# Patient Record
Sex: Female | Born: 1955 | Race: Black or African American | Hispanic: No | Marital: Single | Smoking: Current every day smoker
Health system: Southern US, Community
[De-identification: ages and names within clinical notes are randomized; demographics above are authoritative.]

## PROBLEM LIST (undated history)

## (undated) DIAGNOSIS — I10 Essential (primary) hypertension: Secondary | ICD-10-CM

## (undated) DIAGNOSIS — Z860100 Personal history of colon polyps, unspecified: Secondary | ICD-10-CM

## (undated) DIAGNOSIS — M779 Enthesopathy, unspecified: Secondary | ICD-10-CM

## (undated) DIAGNOSIS — E785 Hyperlipidemia, unspecified: Secondary | ICD-10-CM

## (undated) DIAGNOSIS — M1990 Unspecified osteoarthritis, unspecified site: Secondary | ICD-10-CM

## (undated) DIAGNOSIS — E119 Type 2 diabetes mellitus without complications: Secondary | ICD-10-CM

## (undated) HISTORY — DX: Enthesopathy, unspecified: M77.9

## (undated) HISTORY — DX: Type 2 diabetes mellitus without complications: E11.9

## (undated) HISTORY — PX: CATARACT EXTRACTION: SUR2

## (undated) HISTORY — DX: Personal history of colonic polyps: Z86.010

## (undated) HISTORY — DX: Unspecified osteoarthritis, unspecified site: M19.90

## (undated) HISTORY — DX: Personal history of colon polyps, unspecified: Z86.0100

## (undated) HISTORY — DX: Essential (primary) hypertension: I10

## (undated) HISTORY — DX: Hyperlipidemia, unspecified: E78.5

---

## 1998-01-26 ENCOUNTER — Inpatient Hospital Stay (HOSPITAL_COMMUNITY): Admission: AD | Admit: 1998-01-26 | Discharge: 1998-01-29 | Payer: Self-pay | Admitting: Family Medicine

## 1999-05-15 ENCOUNTER — Encounter: Payer: Self-pay | Admitting: Obstetrics and Gynecology

## 1999-05-15 ENCOUNTER — Encounter: Admission: RE | Admit: 1999-05-15 | Discharge: 1999-05-15 | Payer: Self-pay | Admitting: Obstetrics and Gynecology

## 2000-06-05 ENCOUNTER — Encounter: Admission: RE | Admit: 2000-06-05 | Discharge: 2000-06-05 | Payer: Self-pay | Admitting: Obstetrics and Gynecology

## 2000-06-05 ENCOUNTER — Encounter: Payer: Self-pay | Admitting: Obstetrics and Gynecology

## 2000-07-24 ENCOUNTER — Encounter: Admission: RE | Admit: 2000-07-24 | Discharge: 2000-07-24 | Payer: Self-pay | Admitting: Family Medicine

## 2000-07-24 ENCOUNTER — Encounter: Payer: Self-pay | Admitting: Family Medicine

## 2001-08-25 ENCOUNTER — Encounter: Admission: RE | Admit: 2001-08-25 | Discharge: 2001-08-25 | Payer: Self-pay | Admitting: Obstetrics and Gynecology

## 2001-08-25 ENCOUNTER — Encounter: Payer: Self-pay | Admitting: Obstetrics and Gynecology

## 2002-11-06 ENCOUNTER — Encounter: Admission: RE | Admit: 2002-11-06 | Discharge: 2002-11-06 | Payer: Self-pay | Admitting: Obstetrics and Gynecology

## 2002-11-06 ENCOUNTER — Encounter: Payer: Self-pay | Admitting: Obstetrics and Gynecology

## 2003-11-10 ENCOUNTER — Encounter: Admission: RE | Admit: 2003-11-10 | Discharge: 2003-11-10 | Payer: Self-pay | Admitting: Obstetrics and Gynecology

## 2004-11-13 ENCOUNTER — Encounter: Admission: RE | Admit: 2004-11-13 | Discharge: 2004-11-13 | Payer: Self-pay | Admitting: Obstetrics and Gynecology

## 2005-12-03 ENCOUNTER — Encounter: Admission: RE | Admit: 2005-12-03 | Discharge: 2005-12-03 | Payer: Self-pay | Admitting: Obstetrics and Gynecology

## 2006-02-08 ENCOUNTER — Encounter: Admission: RE | Admit: 2006-02-08 | Discharge: 2006-02-08 | Payer: Self-pay | Admitting: Family Medicine

## 2006-12-26 ENCOUNTER — Encounter: Admission: RE | Admit: 2006-12-26 | Discharge: 2006-12-26 | Payer: Self-pay | Admitting: Obstetrics and Gynecology

## 2007-12-29 ENCOUNTER — Encounter: Admission: RE | Admit: 2007-12-29 | Discharge: 2007-12-29 | Payer: Self-pay | Admitting: Family Medicine

## 2008-01-02 ENCOUNTER — Encounter: Admission: RE | Admit: 2008-01-02 | Discharge: 2008-01-02 | Payer: Self-pay | Admitting: Family Medicine

## 2008-04-30 LAB — HM COLONOSCOPY

## 2008-08-27 ENCOUNTER — Other Ambulatory Visit: Admission: RE | Admit: 2008-08-27 | Discharge: 2008-08-27 | Payer: Self-pay | Admitting: Family Medicine

## 2008-12-29 ENCOUNTER — Encounter: Admission: RE | Admit: 2008-12-29 | Discharge: 2008-12-29 | Payer: Self-pay | Admitting: Family Medicine

## 2009-01-19 ENCOUNTER — Encounter: Payer: Self-pay | Admitting: Physician Assistant

## 2009-05-23 ENCOUNTER — Emergency Department (HOSPITAL_COMMUNITY): Admission: EM | Admit: 2009-05-23 | Discharge: 2009-05-23 | Payer: Self-pay | Admitting: Emergency Medicine

## 2009-06-02 ENCOUNTER — Encounter: Payer: Self-pay | Admitting: Physician Assistant

## 2009-06-09 ENCOUNTER — Ambulatory Visit: Payer: Self-pay | Admitting: Physician Assistant

## 2009-06-09 DIAGNOSIS — K5900 Constipation, unspecified: Secondary | ICD-10-CM | POA: Insufficient documentation

## 2009-06-09 DIAGNOSIS — M1990 Unspecified osteoarthritis, unspecified site: Secondary | ICD-10-CM | POA: Insufficient documentation

## 2009-06-09 DIAGNOSIS — E785 Hyperlipidemia, unspecified: Secondary | ICD-10-CM

## 2009-06-09 DIAGNOSIS — I10 Essential (primary) hypertension: Secondary | ICD-10-CM | POA: Insufficient documentation

## 2009-06-09 DIAGNOSIS — K219 Gastro-esophageal reflux disease without esophagitis: Secondary | ICD-10-CM | POA: Insufficient documentation

## 2009-06-09 DIAGNOSIS — E119 Type 2 diabetes mellitus without complications: Secondary | ICD-10-CM

## 2009-06-09 LAB — CONVERTED CEMR LAB
ALT: 15 units/L (ref 0–35)
AST: 17 units/L (ref 0–37)
Albumin: 4.7 g/dL (ref 3.5–5.2)
Alkaline Phosphatase: 94 units/L (ref 39–117)
Amphetamine Screen, Ur: NEGATIVE
BUN: 11 mg/dL (ref 6–23)
Barbiturate Quant, Ur: NEGATIVE
Basophils Absolute: 0 10*3/uL (ref 0.0–0.1)
Basophils Relative: 0 % (ref 0–1)
Benzodiazepines.: NEGATIVE
Blood Glucose, Fingerstick: 236
CO2: 24 meq/L (ref 19–32)
Calcium: 9.9 mg/dL (ref 8.4–10.5)
Chloride: 106 meq/L (ref 96–112)
Cocaine Metabolites: NEGATIVE
Creatinine, Ser: 0.7 mg/dL (ref 0.40–1.20)
Creatinine,U: 94.2 mg/dL
Eosinophils Absolute: 0.2 10*3/uL (ref 0.0–0.7)
Eosinophils Relative: 2 % (ref 0–5)
Glucose, Bld: 161 mg/dL — ABNORMAL HIGH (ref 70–99)
HCT: 40 % (ref 36.0–46.0)
Hemoglobin: 13.2 g/dL (ref 12.0–15.0)
Hgb A1c MFr Bld: 8.1 % — ABNORMAL HIGH (ref 4.6–6.1)
Lymphocytes Relative: 44 % (ref 12–46)
Lymphs Abs: 3.9 10*3/uL (ref 0.7–4.0)
MCHC: 33 g/dL (ref 30.0–36.0)
MCV: 82.8 fL (ref 78.0–100.0)
Marijuana Metabolite: NEGATIVE
Methadone: NEGATIVE
Microalb, Ur: 0.5 mg/dL (ref 0.00–1.89)
Monocytes Absolute: 0.7 10*3/uL (ref 0.1–1.0)
Monocytes Relative: 8 % (ref 3–12)
Neutro Abs: 4 10*3/uL (ref 1.7–7.7)
Neutrophils Relative %: 45 % (ref 43–77)
Opiate Screen, Urine: NEGATIVE
Phencyclidine (PCP): NEGATIVE
Platelets: 257 10*3/uL (ref 150–400)
Potassium: 4.6 meq/L (ref 3.5–5.3)
Propoxyphene: NEGATIVE
RBC: 4.83 M/uL (ref 3.87–5.11)
RDW: 15.9 % — ABNORMAL HIGH (ref 11.5–15.5)
Sodium: 142 meq/L (ref 135–145)
TSH: 1.214 microintl units/mL (ref 0.350–4.500)
Total Bilirubin: 0.3 mg/dL (ref 0.3–1.2)
Total Protein: 7.3 g/dL (ref 6.0–8.3)
WBC: 8.8 10*3/uL (ref 4.0–10.5)

## 2009-06-10 ENCOUNTER — Encounter: Payer: Self-pay | Admitting: Physician Assistant

## 2009-06-14 ENCOUNTER — Encounter: Payer: Self-pay | Admitting: Physician Assistant

## 2009-07-04 ENCOUNTER — Encounter: Payer: Self-pay | Admitting: Physician Assistant

## 2009-07-07 ENCOUNTER — Ambulatory Visit: Payer: Self-pay | Admitting: Physician Assistant

## 2009-07-07 DIAGNOSIS — N951 Menopausal and female climacteric states: Secondary | ICD-10-CM

## 2009-07-11 ENCOUNTER — Ambulatory Visit: Payer: Self-pay | Admitting: Physician Assistant

## 2009-08-24 ENCOUNTER — Telehealth: Payer: Self-pay | Admitting: Physician Assistant

## 2009-08-29 ENCOUNTER — Encounter: Payer: Self-pay | Admitting: Physician Assistant

## 2009-09-05 ENCOUNTER — Encounter: Payer: Self-pay | Admitting: Physician Assistant

## 2009-09-06 ENCOUNTER — Telehealth: Payer: Self-pay | Admitting: Physician Assistant

## 2009-09-06 ENCOUNTER — Other Ambulatory Visit: Admission: RE | Admit: 2009-09-06 | Discharge: 2009-09-06 | Payer: Self-pay | Admitting: Internal Medicine

## 2009-09-06 ENCOUNTER — Ambulatory Visit: Payer: Self-pay | Admitting: Physician Assistant

## 2009-09-06 DIAGNOSIS — K089 Disorder of teeth and supporting structures, unspecified: Secondary | ICD-10-CM | POA: Insufficient documentation

## 2009-09-06 DIAGNOSIS — R5381 Other malaise: Secondary | ICD-10-CM

## 2009-09-06 DIAGNOSIS — R0602 Shortness of breath: Secondary | ICD-10-CM | POA: Insufficient documentation

## 2009-09-06 DIAGNOSIS — R82998 Other abnormal findings in urine: Secondary | ICD-10-CM | POA: Insufficient documentation

## 2009-09-06 DIAGNOSIS — R5383 Other fatigue: Secondary | ICD-10-CM

## 2009-09-06 DIAGNOSIS — A5901 Trichomonal vulvovaginitis: Secondary | ICD-10-CM

## 2009-09-06 LAB — CONVERTED CEMR LAB
Bilirubin Urine: NEGATIVE
Glucose, Urine, Semiquant: NEGATIVE
KOH Prep: NEGATIVE
OCCULT 1: NEGATIVE
Protein, U semiquant: NEGATIVE
Urobilinogen, UA: 0.2
pH: 5

## 2009-09-07 ENCOUNTER — Encounter: Payer: Self-pay | Admitting: Physician Assistant

## 2009-09-07 LAB — CONVERTED CEMR LAB
Casts: NONE SEEN /lpf
Chlamydia, DNA Probe: NEGATIVE
Crystals: NONE SEEN

## 2009-09-09 ENCOUNTER — Encounter: Payer: Self-pay | Admitting: Physician Assistant

## 2009-09-13 ENCOUNTER — Ambulatory Visit: Payer: Self-pay | Admitting: Physician Assistant

## 2009-09-14 ENCOUNTER — Encounter: Payer: Self-pay | Admitting: Physician Assistant

## 2009-09-15 DIAGNOSIS — E559 Vitamin D deficiency, unspecified: Secondary | ICD-10-CM | POA: Insufficient documentation

## 2009-09-15 LAB — CONVERTED CEMR LAB
Cholesterol: 124 mg/dL (ref 0–200)
Total CHOL/HDL Ratio: 2.8
Triglycerides: 54 mg/dL (ref <150)
VLDL: 11 mg/dL (ref 0–40)
Vit D, 25-Hydroxy: 22 ng/mL — ABNORMAL LOW (ref 30–89)

## 2009-09-19 ENCOUNTER — Encounter: Payer: Self-pay | Admitting: Physician Assistant

## 2009-09-21 ENCOUNTER — Telehealth: Payer: Self-pay | Admitting: Physician Assistant

## 2009-09-27 ENCOUNTER — Ambulatory Visit: Payer: Self-pay | Admitting: Physician Assistant

## 2009-09-27 LAB — CONVERTED CEMR LAB
Glucose, Urine, Semiquant: NEGATIVE
Nitrite: NEGATIVE
Protein, U semiquant: NEGATIVE
Specific Gravity, Urine: >=1.03
WBC Urine, dipstick: NEGATIVE

## 2009-10-24 ENCOUNTER — Ambulatory Visit: Payer: Self-pay | Admitting: Physician Assistant

## 2009-10-27 ENCOUNTER — Encounter (HOSPITAL_COMMUNITY): Admission: RE | Admit: 2009-10-27 | Discharge: 2010-01-04 | Payer: Self-pay | Admitting: Cardiology

## 2009-11-04 ENCOUNTER — Encounter: Payer: Self-pay | Admitting: Physician Assistant

## 2009-11-04 ENCOUNTER — Telehealth: Payer: Self-pay | Admitting: Physician Assistant

## 2009-11-14 ENCOUNTER — Encounter: Payer: Self-pay | Admitting: Physician Assistant

## 2009-11-18 ENCOUNTER — Encounter: Payer: Self-pay | Admitting: Physician Assistant

## 2009-12-30 ENCOUNTER — Ambulatory Visit: Payer: Self-pay | Admitting: Physician Assistant

## 2009-12-30 DIAGNOSIS — M545 Low back pain: Secondary | ICD-10-CM

## 2009-12-30 DIAGNOSIS — F17200 Nicotine dependence, unspecified, uncomplicated: Secondary | ICD-10-CM | POA: Insufficient documentation

## 2010-01-03 ENCOUNTER — Encounter: Payer: Self-pay | Admitting: Physician Assistant

## 2010-01-03 LAB — CONVERTED CEMR LAB
AST: 21 units/L (ref 0–37)
CO2: 21 meq/L (ref 19–32)
Calcium: 10.1 mg/dL (ref 8.4–10.5)
Hgb A1c MFr Bld: 7.3 % — ABNORMAL HIGH (ref <5.7)
Potassium: 4.4 meq/L (ref 3.5–5.3)
Total Bilirubin: 0.3 mg/dL (ref 0.3–1.2)

## 2010-01-10 ENCOUNTER — Ambulatory Visit (HOSPITAL_COMMUNITY): Admission: RE | Admit: 2010-01-10 | Discharge: 2010-01-10 | Payer: Self-pay | Admitting: Internal Medicine

## 2010-05-05 ENCOUNTER — Ambulatory Visit: Admit: 2010-05-05 | Payer: Self-pay | Admitting: Internal Medicine

## 2010-05-21 ENCOUNTER — Encounter: Payer: Self-pay | Admitting: Cardiology

## 2010-05-30 NOTE — Letter (Signed)
Summary: GYNECOLOGIC CYTOLOGY REPORT  GYNECOLOGIC CYTOLOGY REPORT   Imported By: Arta Bruce 09/12/2009 10:15:17  _____________________________________________________________________  External Attachment:    Type:   Image     Comment:   External Document

## 2010-05-30 NOTE — Assessment & Plan Note (Signed)
Summary: NEW/HTN/DIABETIC/GI//KT   Vital Signs:  Patient profile:   55 year old female Menstrual status:  postmenopausal Height:      62.5 inches Weight:      158 pounds BMI:     28.54 Temp:     98.4 degrees F oral Pulse rate:   74 / minute Pulse rhythm:   regular Resp:     18 per minute BP sitting:   112 / 70  (left arm) Cuff size:   regular  Vitals Entered By: Armenia Shannon (June 09, 2009 9:58 AM) CC: NP..    pt says she doesn't think the med glimepiride is working for her... pt says she has been constapated with stomach aches that will go to her back and she thinks the med has something to do with this... pt has been on the med for a year now..., Hypertension Management, Abdominal Pain Is Patient Diabetic? Yes Pain Assessment Patient in pain? no      CBG Result 236  Does patient need assistance? Functional Status Self care Ambulation Normal     Menstrual Status postmenopausal   CC:  NP..    pt says she doesn't think the med glimepiride is working for her... pt says she has been constapated with stomach aches that will go to her back and she thinks the med has something to do with this... pt has been on the med for a year now..., Hypertension Management, and Abdominal Pain.  History of Present Illness: New patient. Previously followed by Dr. Lucianne Muss for diabetes and Dr. Levora Dredge for PCP. Has not had f/u since Sept.  DM:  Sugar high this am but drank some OJ.  But, has been running 150s in am and 200s in evenings.  Has not missed any meds.  Last A1C 8 in Sept. 2010.  She started onglyza at that time.  Did not see her sugars improving.  Had to stop when her insurance was lost.    HTN:  Taking meds.    GERD:  Controlled on pepcid as needed.  Notes some constipation.  Dyspepsia History:      She has no alarm features of dyspepsia including no history of melena, hematochezia, dysphagia, persistent vomiting, or involuntary weight loss > 5%.  There is a prior history of  GERD.  An H-2 blocker medication is currently being taken.    Hypertension History:      She denies chest pain, dyspnea with exertion, and syncope.        Positive major cardiovascular risk factors include diabetes, hyperlipidemia, hypertension, and current tobacco user.  Negative major cardiovascular risk factors include female age less than 22 years old.      Habits & Providers  Alcohol-Tobacco-Diet     Alcohol drinks/day: <1     Alcohol type: beer     Tobacco Status: current     Cigarette Packs/Day: 0.5     Pack years: 25  Exercise-Depression-Behavior     STD Risk: never     Drug Use: no  Current Medications (verified): 1)  Simvastatin 40 Mg Tabs (Simvastatin) .... One Tab Daily 2)  Famotidine 20 Mg Tabs (Famotidine) .... Take Two Tab By Mouth Daily 3)  Lisinopril 20 Mg Tabs (Lisinopril) .... One Tab By Mouth Daily 4)  Metformin Hcl 500 Mg Tabs (Metformin Hcl) .... One Tab By Mouth Twice Daily 5)  Glimepiride 4 Mg Tabs (Glimepiride) .... Half Tab By Mouth Daily  Allergies (verified): No Known Drug Allergies  Past History:  Past  Medical History: Diabetes mellitus, type II (dx 2007) Hyperlipidemia Hypertension Osteoarthritis   a.  left shoulder bone spur   b.  5th toe on left foot "crooked"; saw podiatrist and rec. surgery; pt declined GERD Colonoscopy 08/2008   a.  4 polyps removed ("benign"); incomplete prep and was to have repeat (never done)   b.  [facility] in [place] (Dr. Elbert Ewings. Noe Gens) Last Pap 08/2008 (supposed to have repeat; "not enough cells"; no h/o abnl pap)  Past Surgical History: Denies surgical history  Social History: Occupation: Laid Off; Previously worked at [employer] for 20 years. Current Smoker Alcohol use-yes Drug use-no Single 2 kids (adult)Occupation:  employed Smoking Status:  current Drug Use:  no Packs/Day:  0.5 STD Risk:  never  Review of Systems CV:  Denies chest pain or discomfort, fainting, and shortness of  breath with exertion. GI:  Complains of constipation; denies bloody stools and dark tarry stools. GU:  Denies hematuria. Endo:  Complains of cold intolerance.  Physical Exam  General:  alert, well-developed, and well-nourished.   Head:  normocephalic and atraumatic.   Eyes:  no injection.   Neck:  supple, no thyromegaly, no carotid bruits, and no cervical lymphadenopathy.   Lungs:  normal breath sounds, no crackles, and no wheezes.   Heart:  normal rate, regular rhythm, and no murmur.   Abdomen:  soft, non-tender, normal bowel sounds, and no hepatomegaly.   Extremities:  no edema Neurologic:  alert & oriented X3 and cranial nerves II-XII intact.   Skin:  bilat feet without callus or ulcer nails thickened and c/w onychomycosis  Psych:  normally interactive.     Impression & Recommendations:  Problem # 1:  Preventive Health Care (ICD-V70.0)  mammo done in Aug 2010 ("normal") had flu shot had pnuemovax in 2008 update Td today eventually schedule CPP get records  Orders: T-CBC w/Diff [phone]) T-TSH [phone]) T-HIV Antibody  (Reflex) [id number]) T-Syphilis Test (RPR) [phone]) T-Drug Screen-Urine, (single) ([id number])  Problem # 2:  DIABETES MELLITUS, TYPE II (ICD-250.00)  increase metformin to 1000 mg two times a day check labs  Her updated medication list for this problem includes:    Lisinopril 20 Mg Tabs (Lisinopril) ..... One tab by mouth daily    Metformin Hcl 1000 Mg Tabs (Metformin hcl) .Marland Kitchen... Take 1 tablet by mouth two times a day for diabetes    Glimepiride 4 Mg Tabs (Glimepiride) ..... Half tab by mouth daily    Aspir-low 81 Mg Tbec (Aspirin) .Marland Kitchen... Take 1 tablet by mouth once a day  Orders: T-Comprehensive Metabolic Panel ([phone]) T- Hemoglobin A1C ([id number]) T-Urine Microalbumin w/creat. ratio ([phone])  Problem # 3:  HYPERTENSION (ICD-401.9)  controlled check labs  Her updated medication list for this problem  includes:    Lisinopril 20 Mg Tabs (Lisinopril) ..... One tab by mouth daily  Orders: T-Comprehensive Metabolic Panel ([id number])  Problem # 4:  HYPERLIPIDEMIA (ICD-272.4)  will need to get fasting lipids at some point  Her updated medication list for this problem includes:    Simvastatin 40 Mg Tabs (Simvastatin) ..... One tab daily  Orders: T-Comprehensive Metabolic Panel ([id number])  Problem # 5:  CONSTIPATION (ICD-564.00)  Orders: T-TSH ([id number])  Her updated medication list for this problem includes:    Miralax Powd (Polyethylene glycol 3350) .Marland Kitchen... Dissolve one capful in one glass of water and drink once daily as needed for constipation  Complete Medication List: 1)  Simvastatin 40 Mg Tabs (Simvastatin) .... One tab daily 2)  Famotidine 20  Mg Tabs (Famotidine) .... Take two tab by mouth daily 3)  Lisinopril 20 Mg Tabs (Lisinopril) .... One tab by mouth daily 4)  Metformin Hcl 1000 Mg Tabs (Metformin hcl) .... Take 1 tablet by mouth two times a day for diabetes 5)  Glimepiride 4 Mg Tabs (Glimepiride) .... Half tab by mouth daily 6)  Aspir-low 81 Mg Tbec (Aspirin) .... Take 1 tablet by mouth once a day 7)  Miralax Powd (Polyethylene glycol 3350) .... Dissolve one capful in one glass of water and drink once daily as needed for constipation  Hypertension Assessment/Plan:      The patient's hypertensive risk group is category C: Target organ damage and/or diabetes.  Today's blood pressure is 112/70.  Her blood pressure goal is < 130/80.  Patient Instructions: 1)  Schedule [facility] at [facility]. clinic. 2)  Sign forms to get records from Dr. Lucianne Muss and Dr. Manus Gunning. 3)  Please schedule a follow-up appointment in 1 month with Kentarius Partington for diabetes. 4)  Increase metformin to 1000 mg two times a day.  You can take 2 tablets of the 500 mg to = 1000mg . 5)  Tetanus shot today. Prescriptions: METFORMIN HCL 1000 MG TABS (METFORMIN HCL) Take 1 tablet by mouth two times a day for  diabetes  #60 x 5   Entered and Authorized by:   Tereso Newcomer PA-C   Signed by:   Tereso Newcomer PA-C on 06/09/2009   Method used:   Print then Give to Patient   RxID:   [id number] MIRALAX  POWD (POLYETHYLENE GLYCOL 3350) dissolve one capful in one glass of water and drink once daily as needed for constipation  #1 bottle x 3   Entered and Authorized by:   Tereso Newcomer PA-C   Signed by:   Tereso Newcomer PA-C on 06/09/2009   Method used:   Print then Give to Patient   RxID:   [id number]

## 2010-05-30 NOTE — Assessment & Plan Note (Signed)
Summary: 3 MONTH FU ON DM AND BP/BACK & SHOULDER//KT   Vital Signs:  Patient profile:   55 year old female Menstrual status:  postmenopausal Height:      62.5 inches Weight:      159 pounds BMI:     28.72 Temp:     99.8 degrees F oral Pulse rate:   76 / minute Pulse rhythm:   regular Resp:     18 per minute BP sitting:   128 / 80  (left arm) Cuff size:   regular  Vitals Entered By: Arm[place] Shannon (December 30, 2009 9:55 AM) CC: three month f/u....., Hypertension Management Is Patient Diabetic? Yes Pain Assessment Patient in pain? no       Does patient need assistance? Functional Status Self care Ambulation Normal   Primary Care Provider:  Tereso Newcomer PA-C  CC:  three month f/u..... and Hypertension Management.  History of Present Illness: 55 year old female presents for followup of diabetes and hypertension.  Overall she is doing well.  She has not had her Amaryl in about 2 weeks.  The MAP program is delayed.  She did see a cardiologist.  Her Myoview was negative.  Her EF was mildly reduced.  It does not sound like they did an echocardiogram.  Her symptoms of dyspnea seem to be more related to COPD than anything else.  She continues to smoke.  She has a cough.  She minimally wheezes.  The albuterol inhaler has helped significantly.  She describes New York Heart Association class II symptoms.  She denies orthopnea or PND.  She denies chest pain.  She denies any lower extremity edema.  She was supposed to get a sleep study to rule out sleep apnea.  She had to reschedule this.  She has some low back pain.  This gets worse when she is on her feet longer.  She denies radicular symptoms.  She denies bowel or bladder dysfunction.  She would like help of quitting smoking.  She denies any significant history of depression or suicidal ideations.  Hypertension History:      Positive major cardiovascular risk factors include female age 69 years old or older, diabetes, hyperlipidemia,  hypertension, and current tobacco user.    Current Medications (verified): 1)  Simvastatin 40 Mg Tabs (Simvastatin) .... Take 1 Tab By Mouth At Bedtime For Cholesterol 2)  Famotidine 20 Mg Tabs (Famotidine) .... Take Two Tab By Mouth Daily 3)  Lisinopril 20 Mg Tabs (Lisinopril) .... One Tab By Mouth Daily 4)  Metformin Hcl 1000 Mg Tabs (Metformin Hcl) .... Take 1 Tablet By Mouth Two Times A Day For Diabetes 5)  Glimepiride 4 Mg Tabs (Glimepiride) .... Half Tab By Mouth Daily 6)  Aspir-Low 81 Mg Tbec (Aspirin) .... Take 1 Tablet By Mouth Once A Day 7)  Miralax  Powd (Polyethylene Glycol 3350) .... Dissolve One Capful in One Glass of Water and Drink Once Daily As Needed For Constipation 8)  Freestyle Lite Test  Strp (Glucose Blood) .... Check Sugar At Least Once Daily 9)  Proventil Hfa 108 (90 Base) Mcg/act Aers (Albuterol Sulfate) .Marland Kitchen.. 1-2 Puffs Every 4-6 Hours As Needed 10)  Vitamin D 1000 Unit Tabs (Cholecalciferol) .... Take 1 Tablet By Mouth Once A Day  Allergies (verified): No Known Drug Allergies  Social History: Reviewed history from 06/09/2009 and no changes required. Occupation: [employer]; Previously worked at [employer] for 20 years. Current Smoker Alcohol use-yes Drug use-no Single 2 kids (adult)  Physical Exam  General:  alert, well-developed, and well-nourished.   Head:  normocephalic and atraumatic.   Eyes:  pupils equal, pupils round, pupils reactive to light, and no optic disk abnormalities.   Ears:  R ear normal and L ear normal.   Neck:  supple.   Lungs:  normal breath sounds, no crackles, and no wheezes.   Heart:  normal rate and regular rhythm.   Abdomen:  soft.   Neurologic:  alert & oriented X3 and cranial nerves II-XII intact.   Psych:  normally interactive.    Diabetes Management Exam:    Foot Exam (with socks and/or shoes not present):       Inspection:          Left foot: normal          Right foot: normal       Nails:          Left foot:  thickened          Right foot: thickened   Impression & Recommendations:  Problem # 1:  DIABETES MELLITUS, TYPE II (ICD-250.00) out of glimepiride hard to get at [facility] and waiting on MAP d/c glimepiride replace with glipizide  The following medications were removed from the medication list:    Glimepiride 4 Mg Tabs (Glimepiride) ..... Half tab by mouth daily Her updated medication list for this problem includes:    Lisinopril 20 Mg Tabs (Lisinopril) ..... One tab by mouth daily    Metformin Hcl 1000 Mg Tabs (Metformin hcl) .Marland Kitchen... Take 1 tablet by mouth two times a day for diabetes    Aspir-low 81 Mg Tbec (Aspirin) .Marland Kitchen... Take 1 tablet by mouth once a day    Glipizide 5 Mg Tabs (Glipizide) .Marland Kitchen... Take 1 tablet by mouth once a day for diabetes.  take with a meal.  (pharmacy:  this replaces glimepiride - d/c glimepiride)  Orders: Capillary Blood Glucose/CBG (56213) T- Hemoglobin A1C ([id number]) T-Comprehensive Metabolic Panel ([id number])  Problem # 2:  HYPERTENSION (ICD-401.9) Assessment: Improved controlled  Her updated medication list for this problem includes:    Lisinopril 20 Mg Tabs (Lisinopril) ..... One tab by mouth daily  Orders: T-Comprehensive Metabolic Panel ([id number])  Problem # 3:  HYPERLIPIDEMIA (ICD-272.4) check LFTs  Her updated medication list for this problem includes:    Simvastatin 40 Mg Tabs (Simvastatin) .Marland Kitchen... Take 1 tab by mouth at bedtime for cholesterol  Orders: T-Comprehensive Metabolic Panel ([id number])  Problem # 4:  VITAMIN D DEFICIENCY (ICD-268.9) on replacement  Orders: T-Vitamin D (25-Hydroxy) (95638-75643)  Problem # 5:  LUMBAGO (ICD-724.2) prob mechanical conservative measures:  heat, nsaids, f/u as needed   Her updated medication list for this problem includes:    Aspir-low 81 Mg Tbec (Aspirin) .Marland Kitchen... Take 1 tablet by mouth once a day  Problem # 6:  DYSPNEA (ICD-786.05)  myoview was neg EF was a little low . . . do not  think echo was done no symptoms of CHF patient likely has COPD feels pretty good wih albuterol as needed discussed spiriva . . .does not want to get it breathing stable. . . .hold off on echo or PFTs for now if breathing changes, will get with h/o smoking, will get cxr  Orders: CXR- 2view (CXR)  Problem # 7:  TOBACCO ABUSE (ICD-305.1)  wants something to quit smoking she has no h/o depression no suicidal ideations warned of SE's to chantix  Orders: CXR- 2view (CXR)  Her updated medication list for this problem includes:  Chantix Starting Month Pak 0.5 Mg X 11 & 1 Mg X 42 Tabs (Varenicline tartrate) .Marland Kitchen... As directed    Chantix Continuing Month Pak 1 Mg Tabs (Varenicline tartrate) .Marland Kitchen... As directed  Complete Medication List: 1)  Simvastatin 40 Mg Tabs (Simvastatin) .... Take 1 tab by mouth at bedtime for cholesterol 2)  Famotidine 20 Mg Tabs (Famotidine) .... Take two tab by mouth daily 3)  Lisinopril 20 Mg Tabs (Lisinopril) .... One tab by mouth daily 4)  Metformin Hcl 1000 Mg Tabs (Metformin hcl) .... Take 1 tablet by mouth two times a day for diabetes 5)  Aspir-low 81 Mg Tbec (Aspirin) .... Take 1 tablet by mouth once a day 6)  Miralax Powd (Polyethylene glycol 3350) .... Dissolve one capful in one glass of water and drink once daily as needed for constipation 7)  Freestyle Lite Test Strp (Glucose blood) .... Check sugar at least once daily 8)  Proventil Hfa 108 (90 Base) Mcg/act Aers (Albuterol sulfate) .Marland Kitchen.. 1-2 puffs every 4-6 hours as needed 9)  Vitamin D 1000 Unit Tabs (Cholecalciferol) .... Take 1 tablet by mouth once a day 10)  Glipizide 5 Mg Tabs (Glipizide) .... Take 1 tablet by mouth once a day for diabetes.  take with a meal.  (pharmacy:  this replaces glimepiride - d/c glimepiride) 11)  Chantix Starting Month Pak 0.5 Mg X 11 & 1 Mg X 42 Tabs (Varenicline tartrate) .... As directed 12)  Chantix Continuing Month Pak 1 Mg Tabs (Varenicline tartrate) .... As  directed  Hypertension Assessment/Plan:      The patient's hypertensive risk group is category C: Target organ damage and/or diabetes.  Her calculated 10 year risk of coronary heart disease is 17 %.  Today's blood pressure is 128/80.  Her blood pressure goal is < 130/80.   Patient Instructions: 1)  Schedule your sleep test. 2)  Take Ibuprofen 600 mg with food every 6-8 hours as needed for back pain. 3)  Use heat on your back two times a day to three times a day as needed. 4)  If your pain gets worse or does not improve, schedule an appt with Oakleigh Hesketh for back pain. 5)  Stop the glimepiride. 6)  I have given your Glipizide to take instead.  Take once daily with a meal. 7)  Tobacco is very bad for your health and your loved ones ! You should stop smoking !  8)  Stop smoking tips: Choose a quit date. Cut down before the quit date. Decide what you will do as a substitute when you feel the urge to smoke(gum, toothpick, exercise).  9)  Start the Chantix about one month before your quit date. 10)  Start on a weekend and don't drive.  If you get dizzy with it, stop it and let me know. 11)  Stop the medicine if you develop vivid dreams that are bothersome or thoughts of suicide. 12)  You can continue the Chantix for 2-3 months after you quit, then stop it. 13)  Please schedule a follow-up appointment in 3 months with Johnny Gorter for Diabetes and BP.  Prescriptions: SIMVASTATIN 40 MG TABS (SIMVASTATIN) Take 1 tab by mouth at bedtime for cholesterol  #30 x 5   Entered and Authorized by:   Tereso Newcomer PA-C   Signed by:   Tereso Newcomer PA-C on 12/30/2009   Method used:   Print then Give to Patient   RxID:   [id number] CHANTIX CONTINUING MONTH PAK 1 MG TABS (VARENICLINE TARTRATE) as  directed  #1 x 2   Entered and Authorized by:   Tereso Newcomer PA-C   Signed by:   Tereso Newcomer PA-C on 12/30/2009   Method used:   Print then Give to Patient   RxID:   [id number] CHANTIX STARTING MONTH PAK 0.5 MG X 11  & 1 MG X 42 TABS (VARENICLINE TARTRATE) as directed  #1 x 0   Entered and Authorized by:   Tereso Newcomer PA-C   Signed by:   Tereso Newcomer PA-C on 12/30/2009   Method used:   Print then Give to Patient   RxID:   [id number] GLIPIZIDE 5 MG TABS (GLIPIZIDE) Take 1 tablet by mouth once a day for diabetes.  Take with a meal.  (Pharmacy:  This replaces Glimepiride - d/c Glimepiride)  #30 x 5   Entered and Authorized by:   Tereso Newcomer PA-C   Signed by:   Tereso Newcomer PA-C on 12/30/2009   Method used:   Print then Give to Patient   RxID:   [id number]

## 2010-05-30 NOTE — Letter (Signed)
Summary: RETASURE  RETASURE   Imported By: Arta Bruce 09/20/2009 10:23:41  _____________________________________________________________________  External Attachment:    Type:   Image     Comment:   External Document

## 2010-05-30 NOTE — Letter (Signed)
Summary: [facility]  REQUESTING RECORDS FROM[facility]   Imported By: Arta Bruce 08/15/2009 16:27:06  _____________________________________________________________________  External Attachment:    Type:   Image     Comment:   External Document

## 2010-05-30 NOTE — Progress Notes (Signed)
Summary: Cardiology Referral  Phone Note Outgoing Call   Summary of Call: Refer to cardiology for dyspnea in patient with multiple cardiac risk factors. Letter in system. Initial call taken by: Tereso Newcomer PA-C,  Sep 06, 2009 5:25 PM

## 2010-05-30 NOTE — Assessment & Plan Note (Signed)
Summary: FOLLOW UP IN 1 MONTH WITH SCOTT FOR DM///GK   Vital Signs:  Patient profile:   55 year old female Menstrual status:  postmenopausal Weight:      157.7 pounds Temp:     98.4 degrees F oral Pulse rate:   77 / minute Pulse rhythm:   regular Resp:     17 per minute BP sitting:   117 / 73  (left arm) Cuff size:   regular  Vitals Entered By: Geanie Cooley (July 07, 2009 2:44 PM) CC: Pt here for f/u on DM. Pt states her DM has been good, it just depends on what she eats., Hypertension Management Is Patient Diabetic? Yes Did you bring your meter with you today? No Pain Assessment Patient in pain? no      CBG Result 73  Does patient need assistance? Functional Status Self care Ambulation Normal   CC:  Pt here for f/u on DM. Pt states her DM has been good, it just depends on what she eats., and Hypertension Management.  History of Present Illness: Here for f/u. Sugars on meter reviewed.  Range from 80s to 220. No polyuria, polydipsia or polyphagia. Postmenopausal.  Has a lot of hot flashes. Has blurry vision.  Feels her visual acuity is decreasing.  No loss of vision.   Hypertension History:      She denies chest pain and dyspnea with exertion.  She notes no problems with any antihypertensive medication side effects.        Positive major cardiovascular risk factors include diabetes, hyperlipidemia, hypertension, and current tobacco user.  Negative major cardiovascular risk factors include female age less than 91 years old.     Problems Prior to Update: 1)  Menopause-related Vasomotor Symptoms, Hot Flashes  (ICD-627.2) 2)  Preventive Health Care  (ICD-V70.0) 3)  Constipation  (ICD-564.00) 4)  Gerd  (ICD-530.81) 5)  Osteoarthritis  (ICD-715.90) 6)  Hypertension  (ICD-401.9) 7)  Hyperlipidemia  (ICD-272.4) 8)  Diabetes Mellitus, Type II  (ICD-250.00)  Allergies (verified): No Known Drug Allergies  Past History:  Past Medical History: Last  updated: 07/04/2009 Diabetes mellitus, type II (dx 2007) Hyperlipidemia Hypertension Osteoarthritis   a.  left shoulder bone spur   b.  5th toe on left foot "crooked"; saw podiatrist and rec. surgery; pt declined Left shoulder impingement - eval by Dr. Gean Birchwood 11/2008 GERD Colonoscopy 08/2008   a.  4 polyps removed ("benign"); incomplete prep and was to have repeat (never done)   b.  [facility] in [place] (Dr. Elbert Ewings. Noe Gens)   c.  repeat planned in 2015 Last Pap 08/2008 (supposed to have repeat; "not enough cells"; no h/o abnl pap)   a.  ASCUS  Mammo 12/2008:  normal  Past Surgical History: Last updated: 06/09/2009 Denies surgical history  Family History: CAD - sis died at 49 with MI DM - brother  Physical Exam  General:  alert, well-developed, and well-nourished.   Head:  normocephalic and atraumatic.   Neck:  supple.   Lungs:  normal breath sounds.   Heart:  normal rate and regular rhythm.   Extremities:  no edema Neurologic:  alert & oriented X3 and cranial nerves II-XII intact.   Psych:  normally interactive.     Impression & Recommendations:  Problem # 1:  DIABETES MELLITUS, TYPE II (ICD-250.00) sugars ok most below 200 encouraged her to increase activity and watch diet recheck A1C at f/u schedule retasure advised her to go to [facility] for eye  exam  Her updated medication list for this problem includes:    Lisinopril 20 Mg Tabs (Lisinopril) ..... One tab by mouth daily    Metformin Hcl 1000 Mg Tabs (Metformin hcl) .Marland Kitchen... Take 1 tablet by mouth two times a day for diabetes    Glimepiride 4 Mg Tabs (Glimepiride) ..... Half tab by mouth daily    Aspir-low 81 Mg Tbec (Aspirin) .Marland Kitchen... Take 1 tablet by mouth once a day  Orders: Capillary Blood Glucose/CBG (19147)  Problem # 2:  HYPERTENSION (ICD-401.9) controlled  Her updated medication list for this problem includes:    Lisinopril 20 Mg Tabs (Lisinopril) ..... One tab by mouth daily  Problem # 3:   PREVENTIVE HEALTH CARE (ICD-V70.0) past due for pap had ASCUS in 08/2008 schedule CPP  Problem # 4:  MENOPAUSE-RELATED VASOMOTOR SYMPTOMS, HOT FLASHES (ICD-627.2) advised her to try OTC remedies she has been looking into this she can try black cohosh or phytoestrogens consider other therapies if unsuccessful  Complete Medication List: 1)  Simvastatin 40 Mg Tabs (Simvastatin) .... One tab daily 2)  Famotidine 20 Mg Tabs (Famotidine) .... Take two tab by mouth daily 3)  Lisinopril 20 Mg Tabs (Lisinopril) .... One tab by mouth daily 4)  Metformin Hcl 1000 Mg Tabs (Metformin hcl) .... Take 1 tablet by mouth two times a day for diabetes 5)  Glimepiride 4 Mg Tabs (Glimepiride) .... Half tab by mouth daily 6)  Aspir-low 81 Mg Tbec (Aspirin) .... Take 1 tablet by mouth once a day 7)  Miralax Powd (Polyethylene glycol 3350) .... Dissolve one capful in one glass of water and drink once daily as needed for constipation 8)  Freestyle Lite Test Strp (Glucose blood) .... Check sugar at least once daily  Hypertension Assessment/Plan:      The patient's hypertensive risk group is category C: Target organ damage and/or diabetes.  Today's blood pressure is 117/73.  Her blood pressure goal is < 130/80.  Patient Instructions: 1)  Schedule retasure at [facility]. 2)  Please schedule a follow-up appointment in 2 months with Scott for CPP. 3)  Come fasting for blood work.  DO not eat or drink anything after midnight the night before (except water). 4)  It is important that you exercise reguarly at least 20 minutes 5 times a week. If you develop chest pain, have severe difficulty breathing, or feel very tired, stop exercising immediately and seek medical attention.  5)  Look for a special at [facility] or [employer] to get your eyes examined. Prescriptions: FREESTYLE LITE TEST  STRP (GLUCOSE BLOOD) check sugar at least once daily  #1 mo supply x 11   Entered and Authorized by:   Tereso Newcomer PA-C   Signed  by:   Tereso Newcomer PA-C on 07/07/2009   Method used:   Print then Give to Patient   RxID:   [id number]    Vital Signs:  Patient profile:   55 year old female Menstrual status:  postmenopausal Weight:      157.7 pounds Temp:     98.4 degrees F oral Pulse rate:   77 / minute Pulse rhythm:   regular Resp:     17 per minute BP sitting:   117 / 73  (left arm) Cuff size:   regular  Vitals Entered By: Geanie Cooley (July 07, 2009 2:44 PM)

## 2010-05-30 NOTE — Miscellaneous (Signed)
Summary: Pap Normal  Clinical Lists Changes  Observations: Added new observation of PAP SMEAR:  Specimen Adequacy: Satisfactory for evaluation.   Interpretation/Result:Negative for intraepithelial Lesion or Malignancy.   Interpretation/Result:Trichomonas Vaginalis present.     (09/06/2009 17:08)      Pap Smear  Procedure date:  09/06/2009  Findings:       Specimen Adequacy: Satisfactory for evaluation.   Interpretation/Result:Negative for intraepithelial Lesion or Malignancy.   Interpretation/Result:Trichomonas Vaginalis present.

## 2010-05-30 NOTE — Letter (Signed)
Summary: REQUESTING RECORDS FROM FROM DR.EHINGER  REQUESTING RECORDS FROM FROM DR.EHINGER   Imported By: Arta Bruce 08/15/2009 16:32:49  _____________________________________________________________________  External Attachment:    Type:   Image     Comment:   External Document

## 2010-05-30 NOTE — Miscellaneous (Signed)
Summary: Stress Test Done  Clinical Lists Changes  Observations: Added new observation of PAST MED HX: Diabetes mellitus, type II (dx 2007) Hyperlipidemia Hypertension Osteoarthritis   a.  left shoulder bone spur   b.  5th toe on left foot "crooked"; saw podiatrist and rec. surgery; pt declined Left shoulder impingement - eval by Dr. Gean Birchwood 11/2008 GERD Colonoscopy 08/2008   a.  4 polyps removed ("benign"); incomplete prep and was to have repeat (never done)   b.  [facility] in [place] (Dr. Elbert Ewings. Noe Gens)   c.  repeat planned in 2015 Last Pap 08/2008 (supposed to have repeat; "not enough cells"; no h/o abnl pap)   a.  ASCUS  Mammo 12/2008:  normal Exercise myoview 10/27/2009: no ischemia; EF 48%; apical, lateral and inf. [place] (Dr. Anne Fu; [place])  (11/14/2009 12:04) Added new observation of NUCETTCOMM: Ordered by Dr. Anne Fu. Performed at [facility]. (10/27/2009 12:07) Added new observation of NUCST CONC: 1.  There is no evidence for exercise induced myocardial ischemia. 2.  LV EF = 48% 3.  Apical, Lateral and Inferior wall [place] (10/27/2009 12:07)      Nuclear ETT  Procedure date:  10/27/2009  Findings:      1.  There is no evidence for exercise induced myocardial ischemia. 2.  LV EF = 48% 3.  Apical, Lateral and Inferior wall [place]  Comments:      Ordered by Dr. Anne Fu. Performed at [facility].   Past History:  Past Medical History: Diabetes mellitus, type II (dx 2007) Hyperlipidemia Hypertension Osteoarthritis   a.  left shoulder bone spur   b.  5th toe on left foot "crooked"; saw podiatrist and rec. surgery; pt declined Left shoulder impingement - eval by Dr. Gean Birchwood 11/2008 GERD Colonoscopy 08/2008   a.  4 polyps removed ("benign"); incomplete prep and was to have repeat (never done)   b.  [facility] in [place] (Dr. Elbert Ewings. Noe Gens)   c.  repeat planned in 2015 Last Pap 08/2008 (supposed to have repeat; "not enough cells"; no h/o abnl  pap)   a.  ASCUS  Mammo 12/2008:  normal Exercise myoview 10/27/2009: no ischemia; EF 48%; apical, lateral and inf. [place] (Dr. Anne Fu; [place])

## 2010-05-30 NOTE — Letter (Signed)
Summary: RECEIVED RECORDS FROM DR.EHINGER  RECEIVED RECORDS FROM DR.EHINGER   Imported By: Arta Bruce 08/29/2009 16:35:44  _____________________________________________________________________  External Attachment:    Type:   Image     Comment:   External Document

## 2010-05-30 NOTE — Letter (Signed)
Summary: P4[facility]  P4HM   Imported By: Arta Bruce 08/04/2009 14:33:06  _____________________________________________________________________  External Attachment:    Type:   Image     Comment:   External Document

## 2010-05-30 NOTE — Progress Notes (Signed)
Summary: THE [facility] FAXED HER CHOL MED REFILL  Phone Note Call from Patient Call back at Home Phone [phone]   Reason for Call: Refill Medication Summary of Call: Haley Mendez PT. MS Teng CALLED AND SAYS THAT THE [facility] FAXED OVER HER REFILL REQUEST FOR HER CHOL. MED YESTERDAY. Initial call taken by: Leodis Rains,  August 24, 2009 2:44 PM  Follow-up for Phone Call        forward to provider Follow-up by: [place] Shannon,  August 24, 2009 2:55 PM  Additional Follow-up for Phone Call Additional follow up Details #1::        the pt called back again because she needs her chol medication.[facility]. Manon Hilding  August 25, 2009 10:57 AM    Additional Follow-up for Phone Call Additional follow up Details #2::    Left message on answering machine for pt to call back...Marland KitchenMarland KitchenArmenia Shannon  August 25, 2009 2:21 PM  pt is aware... Armenia Shannon  August 25, 2009 4:06 PM   New/Updated Medications: SIMVASTATIN 40 MG TABS (SIMVASTATIN) Take 1 tab by mouth at bedtime for cholesterol Prescriptions: SIMVASTATIN 40 MG TABS (SIMVASTATIN) Take 1 tab by mouth at bedtime for cholesterol  #30 x 5   Entered and Authorized by:   Tereso Newcomer PA-C   Signed by:   Tereso Newcomer PA-C on 08/25/2009   Method used:   Faxed to ...       [facility] Department (retail)       [address] [place], [place]  [address]       Ph: [phone]       Fax: [phone]   RxID:   [id number]

## 2010-05-30 NOTE — Assessment & Plan Note (Signed)
Summary: CPP EXAM//GK   Vital Signs:  Patient profile:   55 year old female Menstrual status:  postmenopausal Height:      62.5 inches Weight:      157 pounds BMI:     28.36 Temp:     98.4 degrees F oral Pulse rate:   78 / minute Pulse rhythm:   regular Resp:     18 per minute BP sitting:   140 / 84  (left arm) Cuff size:   regular  Vitals Entered By: Armenia Shannon (Sep 06, 2009 9:07 AM)  Primary Care Provider:  Tereso Newcomer PA-C   History of Present Illness: Here for CPP.  Has a h/o ASCUS with previous PCP. Overdue for pap. No abnormal bleeding. Does have recent vaginal discharge.  Notes for about a year.  Notes foul odor.  Looks yellow or brown. Sexually active with one partner. Does have a h/o trichomonas in the past.   No pelvic pain or fevers. Mammo due in 11/2009. No FHx of breast cancer, ovarian cancer or colon cancer. Colo due in 2015. Td shot not done last time. PHQ9=7 today. . . mainly from fatigue.  Also, c/o toothache for about 2 weeks.  No fever.  No facial swelling.  Does not think tooth has fallen out. No dental follow up in many years.  Notes a lot of fatigue.  Does report h/o snoring.  Never tested for sleep apnea.  Wakes up feeling sleepy.  Notes daytime hypersomnolence.  Notes these symptoms for about 6-7 years.  Hypertension History:      Positive major cardiovascular risk factors include diabetes, hyperlipidemia, hypertension, and current tobacco user.  Negative major cardiovascular risk factors include female age less than 75 years old.    Habits & Providers  Alcohol-Tobacco-Diet     Alcohol drinks/day: <1     Alcohol type: beer     Tobacco Status: current  Exercise-Depression-Behavior     Have you felt down or hopeless? no     Have you felt little pleasure in things? no     STD Risk: past     Drug Use: never     Seat Belt Use: always  Problems Prior to Update: 1)  Trichomonal Vaginitis  (ICD-131.01) 2)  Dyspnea  (ICD-786.05) 3)   Dental Pain  (ICD-525.9) 4)  Fatigue  (ICD-780.79) 5)  Urinalysis, Abnormal  (ICD-791.9) 6)  Menopause-related Vasomotor Symptoms, Hot Flashes  (ICD-627.2) 7)  Preventive Health Care  (ICD-V70.0) 8)  Constipation  (ICD-564.00) 9)  Gerd  (ICD-530.81) 10)  Osteoarthritis  (ICD-715.90) 11)  Hypertension  (ICD-401.9) 12)  Hyperlipidemia  (ICD-272.4) 13)  Diabetes Mellitus, Type II  (ICD-250.00)  Allergies: No Known Drug Allergies  Past History:  Past Medical History: Last updated: 07/04/2009 Diabetes mellitus, type II (dx 2007) Hyperlipidemia Hypertension Osteoarthritis   a.  left shoulder bone spur   b.  5th toe on left foot "crooked"; saw podiatrist and rec. surgery; pt declined Left shoulder impingement - eval by Dr. Gean Birchwood 11/2008 GERD Colonoscopy 08/2008   a.  4 polyps removed ("benign"); incomplete prep and was to have repeat (never done)   b.  [facility] in [place] (Dr. Elbert Ewings. Noe Gens)   c.  repeat planned in 2015 Last Pap 08/2008 (supposed to have repeat; "not enough cells"; no h/o abnl pap)   a.  ASCUS  Mammo 12/2008:  normal  Past Surgical History: Last updated: 06/09/2009 Denies surgical history  Family History: Reviewed history from 07/07/2009 and no  changes required. CAD - sis died at 80 with MI; mother had MI 60 DM - brother, father  Social History: Drug Use:  never STD Risk:  past Seat Belt Use:  always  Review of Systems      See HPI General:  Denies chills and fever. CV:  Complains of shortness of breath with exertion; denies chest pain or discomfort. Resp:  Complains of cough; denies coughing up blood. GI:  Denies bloody stools and dark tarry stools. GU:  Denies dysuria, hematuria, urinary frequency, and urinary hesitancy. Psych:  Denies suicidal thoughts/plans. Endo:  Denies cold intolerance and heat intolerance.  Physical Exam  General:  alert, well-developed, and well-nourished.   Head:  normocephalic and atraumatic.   Eyes:  pupils  equal, pupils round, pupils reactive to light, and no retinal abnormalitiies.   Ears:  R ear normal and L ear normal.   Nose:  no external deformity.   Mouth:  pharynx pink and moist, no erythema, no exudates, poor dentition, and teeth missing.   Neck:  supple, no thyromegaly, no JVD, no carotid bruits, and no cervical lymphadenopathy.   Breasts:  skin/areolae normal, no masses, no abnormal thickening, no nipple discharge, no tenderness, and no adenopathy.   Lungs:  normal breath sounds, no crackles, and no wheezes.   Heart:  normal rate, regular rhythm, and no murmur.   Abdomen:  soft, non-tender, normal bowel sounds, and no hepatomegaly.   Rectal:  normal sphincter tone, no masses, no tenderness, and external hemorrhoid(s).   Genitalia:  normal introitus, no external lesions, no vaginal discharge, mucosa pink and moist, no vaginal or cervical lesions, no vaginal atrophy, no friaility or hemorrhage, normal uterus size and position, and no adnexal masses or tenderness.   Msk:  normal ROM.   Pulses:  R posterior tibial normal, R dorsalis pedis normal, L posterior tibial normal, and L dorsalis pedis normal.   Extremities:  no edema Neurologic:  alert & oriented X3, cranial nerves II-XII intact, and strength normal in all extremities.   Skin:  turgor normal.   Psych:  normally interactive and good eye contact.     Impression & Recommendations:  Problem # 1:  DIABETES MELLITUS, TYPE II (ICD-250.00)  Haley Mendez states she had Retasure recently and was told it was normal (I have not rec'd report yet) A1C 7 today cont current meds if next A1C 7 or higher, increase glimepiride  Her updated medication list for this problem includes:    Lisinopril 20 Mg Tabs (Lisinopril) ..... One tab by mouth daily    Metformin Hcl 1000 Mg Tabs (Metformin hcl) .Marland Kitchen... Take 1 tablet by mouth two times a day for diabetes    Glimepiride 4 Mg Tabs (Glimepiride) ..... Half tab by mouth daily    Aspir-low 81 Mg  Tbec (Aspirin) .Marland Kitchen... Take 1 tablet by mouth once a day  Orders: Capillary Blood Glucose/CBG (82948) Hemoglobin A1C (83036)  Problem # 2:  FATIGUE (ICD-780.79)  Orders: Split Night (Split Night)  Problem # 3:  DENTAL PAIN (ICD-525.9)  will try to get into dental clinic this month  Orders: Dental Referral (Dentist)  Problem # 4:  DYSPNEA (ICD-786.05)  suspect related to COPD with cough start Proventil HFA as needed with FHx and multiple cardiac risk factors, will refer to Card to consider stress testing ECG today  Orders: Cardiology Referral (Cardiology)  Problem # 5:  HYPERTENSION (ICD-401.9) previously well controlled monitor for now  Her updated medication list for this problem includes:  Lisinopril 20 Mg Tabs (Lisinopril) ..... One tab by mouth daily  Orders: EKG w/ Interpretation (93000)  Problem # 6:  HYPERLIPIDEMIA (ICD-272.4) schedule fasting lipids  Her updated medication list for this problem includes:    Simvastatin 40 Mg Tabs (Simvastatin) .Marland Kitchen... Take 1 tab by mouth at bedtime for cholesterol  Problem # 7:  URINALYSIS, ABNORMAL (ICD-791.9)  Orders: T-Culture, Urine (16109-60454) T- * Misc. Laboratory test [id number])  Problem # 8:  PREVENTIVE HEALTH CARE (ICD-V70.0) colo due in 2015 mammo due in 11/2009  Orders: T-Pap Smear, Thin Prep (91478) T- GC Chlamydia (29562) T-Urinalysis (81003-65000) KOH/ WET Mount [phone]) Hemoccult Guaiac-1 spec.(in office) (82270)Future Orders: Mammogram (Screening) (Mammo) ... 11/28/2009  Problem # 9:  TRICHOMONAL VAGINITIS (ICD-131.01) tx with flagyl  Complete Medication List: 1)  Simvastatin 40 Mg Tabs (Simvastatin) .... Take 1 tab by mouth at bedtime for cholesterol 2)  Famotidine 20 Mg Tabs (Famotidine) .... Take two tab by mouth daily 3)  Lisinopril 20 Mg Tabs (Lisinopril) .... One tab by mouth daily 4)  Metformin Hcl 1000 Mg Tabs (Metformin hcl) .... Take 1 tablet by mouth two times a day for diabetes 5)   Glimepiride 4 Mg Tabs (Glimepiride) .... Half tab by mouth daily 6)  Aspir-low 81 Mg Tbec (Aspirin) .... Take 1 tablet by mouth once a day 7)  Miralax Powd (Polyethylene glycol 3350) .... Dissolve one capful in one glass of water and drink once daily as needed for constipation 8)  Freestyle Lite Test Strp (Glucose blood) .... Check sugar at least once daily 9)  Proventil Hfa 108 (90 Base) Mcg/act Aers (Albuterol sulfate) .Marland Kitchen.. 1-2 puffs every 4-6 hours as needed 10)  Flagyl 500 Mg Tabs (Metronidazole) .... Take 1 tablet by mouth two times a day  Hypertension Assessment/Plan:      The patient's hypertensive risk group is category C: Target organ damage and/or diabetes.  Today's blood pressure is 140/84.  Her blood pressure goal is < 130/80.   Patient Instructions: 1)  Return to the lab fasting this week or next for Lipids and Vitamin D (Dx 272.4, V70.0).  Do not eat or drink anything after midnight the night before except water. 2)  Td shot today. 3)  Schedule appointment at foot clinic on [facility]. 4)  Please schedule a follow-up appointment in 3 months with Lunabella Badgett for diabetes and blood pressure. 5)  Your partner must be treated for trichomonas. Prescriptions: FLAGYL 500 MG TABS (METRONIDAZOLE) Take 1 tablet by mouth two times a day  #14 x 0   Entered and Authorized by:   Tereso Newcomer PA-C   Signed by:   Tereso Newcomer PA-C on 09/06/2009   Method used:   Print then Give to Patient   RxID:   [id number] PROVENTIL HFA 108 (90 BASE) MCG/ACT AERS (ALBUTEROL SULFATE) 1-2 puffs every 4-6 hours as needed  #1 x 5   Entered and Authorized by:   Tereso Newcomer PA-C   Signed by:   Tereso Newcomer PA-C on 09/06/2009   Method used:   Print then Give to Patient   RxID:   [id number]   Laboratory Results   Urine Tests  Date/Time Received: Sep 06, 2009 9:19 AM   Routine Urinalysis   Glucose: negative   (Normal Range: Negative) Bilirubin: negative   (Normal Range: Negative) Ketone: negative    (Normal Range: Negative) Spec. Gravity: >=1.030   (Normal Range: 1.003-1.035) Blood: trace-lysed   (Normal Range: Negative) pH: 5.0   (Normal Range: 5.0-8.0) Protein:  negative   (Normal Range: Negative) Urobilinogen: 0.2   (Normal Range: 0-1) Nitrite: negative   (Normal Range: Negative) Leukocyte Esterace: large   (Normal Range: Negative)     Blood Tests     CBG Random:: 113mg /dL    Wet Mount Source: vaginal WBC/hpf: >20 Bacteria/hpf: 1+ Clue cells/hpf: few  Negative whiff Yeast/hpf: none Wet Mount KOH: Negative Trichomonas/hpf: moderate  Stool - Occult Blood Hemmoccult #1: negative Date: 09/06/2009       EKG  Procedure date:  09/06/2009  Findings:      Normal sinus rhythm with rate of:  60 normal axis nonspecific ST-TW changes

## 2010-05-30 NOTE — Letter (Signed)
Summary: DENTAL REFERRAL  DENTAL REFERRAL   Imported By: Arta Bruce 09/06/2009 14:18:27  _____________________________________________________________________  External Attachment:    Type:   Image     Comment:   External Document

## 2010-05-30 NOTE — Letter (Signed)
Summary: RECORDS RECEIVED FROM DR.AJAY KUMAR  RECORDS RECEIVED FROM DR.AJAY KUMAR   Imported By: Arta Bruce 10/03/2009 11:22:01  _____________________________________________________________________  External Attachment:    Type:   Image     Comment:   External Document

## 2010-05-30 NOTE — Miscellaneous (Signed)
Summary: Retasure Normal  Clinical Lists Changes  Observations: Added new observation of DIAB EYE EX: Retasure Normal (07/11/2009 23:14)

## 2010-05-30 NOTE — Miscellaneous (Signed)
Summary: prior PCP records reviewed  Clinical Lists Changes  Observations: Added new observation of PAST MED HX: Diabetes mellitus, type II (dx 2007) Hyperlipidemia Hypertension Osteoarthritis   a.  left shoulder bone spur   b.  5th toe on left foot "crooked"; saw podiatrist and rec. surgery; pt declined Left shoulder impingement - eval by Dr. Gean Birchwood 11/2008 GERD Colonoscopy 08/2008   a.  4 polyps removed ("benign"); incomplete prep and was to have repeat (never done)   b.  [facility] in [place] (Dr. Elbert Ewings. Noe Gens)   c.  repeat planned in 2015 Last Pap 08/2008 (supposed to have repeat; "not enough cells"; no h/o abnl pap)   a.  ASCUS  Mammo 12/2008:  normal (07/04/2009 20:51) Added new observation of COLONRECACT: Repeat colonoscopy in 5 years.  (08/30/2008 20:53) Added new observation of COLONOSCOPY:  Results: Polyp.  Pathology:  Hyperplastic polyp.      (08/30/2008 20:53)      Colonoscopy  Procedure date:  08/30/2008  Findings:       Results: Polyp.  Pathology:  Hyperplastic polyp.       Comments:      Repeat colonoscopy in 5 years.    Past History:  Past Medical History: Diabetes mellitus, type II (dx 2007) Hyperlipidemia Hypertension Osteoarthritis   a.  left shoulder bone spur   b.  5th toe on left foot "crooked"; saw podiatrist and rec. surgery; pt declined Left shoulder impingement - eval by Dr. Gean Birchwood 11/2008 GERD Colonoscopy 08/2008   a.  4 polyps removed ("benign"); incomplete prep and was to have repeat (never done)   b.  [facility] in [place] (Dr. Elbert Ewings. Noe Gens)   c.  repeat planned in 2015 Last Pap 08/2008 (supposed to have repeat; "not enough cells"; no h/o abnl pap)   a.  ASCUS  Mammo 12/2008:  normal

## 2010-05-30 NOTE — Letter (Signed)
Summary: PODIATRY  PODIATRY   Imported By: Arta Bruce 11/21/2009 14:18:37  _____________________________________________________________________  External Attachment:    Type:   Image     Comment:   External Document

## 2010-05-30 NOTE — Progress Notes (Signed)
Summary: REFILLS  Phone Note Call from Patient Call back at Home Phone [phone]   Reason for Call: Refill Medication Summary of Call: WEAVER PT. MS Muellner SAYS THAT SHE NEEDS REFILLS ON LISINOPRIL AND GLIMEPRIDE CALLED INTO [facility]. Initial call taken by: Leodis Rains,  Sep 21, 2009 10:28 AM  Follow-up for Phone Call        Pt call back again for her refills request. Pt needs the provider to write down the prescriptions. Manon Hilding  Sep 22, 2009 9:26 AM   spoke with pt and she wants med to be fax to [facility]... pt says she recieved these meds from urgent care the first time and now she is out and [facility] will not fill them unless it came from [place].. Arm[place] Shannon  Sep 22, 2009 11:27 AM   Additional Follow-up for Phone Call Additional follow up Details #1::        Rx printed and at station fax to [facility] with pt. and advised of refill faxed to [facility]. Dutch Quint RN  Sep 23, 2009 12:53 PM Additional Follow-up by: Lehman Prom FNP,  Sep 22, 2009 5:46 PM    Prescriptions: GLIMEPIRIDE 4 MG TABS (GLIMEPIRIDE) half tab by mouth daily  #15 x 5   Entered and Authorized by:   Lehman Prom FNP   Signed by:   Lehman Prom FNP on 09/22/2009   Method used:   Printed then faxed to ...         RxID:   [id number] LISINOPRIL 20 MG TABS (LISINOPRIL) one tab by mouth daily  #30 x 5   Entered and Authorized by:   Lehman Prom FNP   Signed by:   Lehman Prom FNP on 09/22/2009   Method used:   Printed then faxed to ...         RxID:   [id number]

## 2010-05-30 NOTE — Letter (Signed)
Summary: *HSN Results Follow up  [facility]  [address] [place], [place] [address]   Phone: [phone]  Fax: [phone]      01/03/2010   Doshie Wilmot [address] [place], [place]  [address]   Dear  Ms. Elienai Mcswain,                            ____S.Drinkard,FNP   ____D. Gore,FNP       ____B. McPherson,MD   ____V. Rankins,MD    ____E. Mulberry,MD    ____N. Daphine Deutscher, FNP  ____D. Reche Dixon, MD    ____K. Philipp Deputy, MD    __x__S. Alben Spittle, PA-C     This letter is to inform you that your recent test(s):  _______Pap Smear    ____x___Lab Test     _______X-ray    ___x____ is within acceptable limits  _______ requires a medication change  _______ requires a follow-up lab visit  _______ requires a follow-up visit with your provider   Comments: Your A1C is 7.3.  I want that below 7.  Let's see how the new Glipizide works for you when I see you again with a repeat A1C.  You liver and kidney function are good.  Your vitamin D level is good.         _________________________________________________________ If you have any questions, please contact our office                     Sincerely,  Tereso Newcomer PA-C [facility]

## 2010-05-30 NOTE — Progress Notes (Signed)
Summary: Office Visit//DEPRESSION SCREENING  Office Visit//DEPRESSION SCREENING   Imported By: Arta Bruce 11/03/2009 12:15:24  _____________________________________________________________________  External Attachment:    Type:   Image     Comment:   External Document

## 2010-05-30 NOTE — Progress Notes (Signed)
  Phone Note Outgoing Call   Summary of Call: Please have patient reschedule sleep study.  She canceled recently.  Initial call taken by: Tereso Newcomer PA-C,  November 04, 2009 4:22 PM  Follow-up for Phone Call        Left message on answering machine for pt to call back...Marland KitchenMarland KitchenArmenia Mendez  November 07, 2009 12:53 PM   spoke with pt and she said she was going to reschedule Follow-up by: Haley Mendez,  November 07, 2009 3:27 PM

## 2010-05-30 NOTE — Letter (Signed)
Summary: *HSN Results Follow up  [facility]  [address] [place], [place] [address]   Phone: [phone]  Fax: [phone]      09/09/2009   Colbi Gates [address] [place], [place]  [address]   Dear  Ms. Bethan Sherman,                            ____S.Drinkard,FNP   ____D. Gore,FNP       ____B. McPherson,MD   ____V. Rankins,MD    ____E. Mulberry,MD    ____N. Daphine Deutscher, FNP  ____D. Reche Dixon, MD    ____K. Philipp Deputy, MD    __x__S. Alben Spittle, PA-C     This letter is to inform you that your recent test(s):  ____x___Pap Smear    _______Lab Test     _______X-ray    ___x____ is within acceptable limits  _______ requires a medication change  _______ requires a follow-up lab visit  _______ requires a follow-up visit with your provider   Comments:       _________________________________________________________ If you have any questions, please contact our office                     Sincerely,  Tereso Newcomer PA-C [facility]

## 2010-05-30 NOTE — Miscellaneous (Signed)
  Clinical Lists Changes  Observations: Added new observation of MAMMOGRAM: per patient (11/29/2008 17:21)

## 2010-05-30 NOTE — Miscellaneous (Signed)
  Clinical Lists Changes  Observations: Added new observation of PAST MED HX: Diabetes mellitus, type II (dx 2007)   a. Podiatry clinic at [facility] 6.27.2011 Hyperlipidemia Hypertension Osteoarthritis   a.  left shoulder bone spur   b.  5th toe on left foot "crooked"; saw podiatrist and rec. surgery; pt declined Left shoulder impingement - eval by Dr. Gean Birchwood 11/2008 GERD Colonoscopy 08/2008   a.  4 polyps removed ("benign"); incomplete prep and was to have repeat (never done)   b.  [facility] in [place] (Dr. Elbert Ewings. Noe Gens)   c.  repeat planned in 2015 Last Pap 08/2008 (supposed to have repeat; "not enough cells"; no h/o abnl pap)   a.  ASCUS  Mammo 12/2008:  normal Exercise myoview 10/27/2009: no ischemia; EF 48%; apical, lateral and inf. [place] (Dr. Anne Fu; [place])  (11/18/2009 16:30)       Past History:  Past Medical History: Diabetes mellitus, type II (dx 2007)   a. Podiatry clinic at [facility] 6.27.2011 Hyperlipidemia Hypertension Osteoarthritis   a.  left shoulder bone spur   b.  5th toe on left foot "crooked"; saw podiatrist and rec. surgery; pt declined Left shoulder impingement - eval by Dr. Gean Birchwood 11/2008 GERD Colonoscopy 08/2008   a.  4 polyps removed ("benign"); incomplete prep and was to have repeat (never done)   b.  [facility] in [place] (Dr. Elbert Ewings. Noe Gens)   c.  repeat planned in 2015 Last Pap 08/2008 (supposed to have repeat; "not enough cells"; no h/o abnl pap)   a.  ASCUS  Mammo 12/2008:  normal Exercise myoview 10/27/2009: no ischemia; EF 48%; apical, lateral and inf. [place] (Dr. Anne Fu; [place])

## 2010-05-30 NOTE — Letter (Signed)
Summary: STRESS TEST  STRESS TEST   Imported By: Arta Bruce 11/15/2009 12:47:35  _____________________________________________________________________  External Attachment:    Type:   Image     Comment:   External Document

## 2010-05-30 NOTE — Letter (Signed)
Summary: *HSN Results Follow up  [facility]  [address] [place], [place] [address]   Phone: [phone]  Fax: [phone]      09/19/2009   Haley Mendez [address] [place], [place]  [address]   Dear  Ms. [name],                            ____S.Drinkard,FNP   ____D. Gore,FNP       ____B. McPherson,MD   ____V. Rankins,MD    ____E. Mulberry,MD    ____N. Daphine Deutscher, FNP  ____D. Reche Dixon, MD    ____K. Philipp Deputy, MD    _x___S. Alben Spittle, PA-C     This letter is to inform you that your recent test(s):  _______Pap Smear    _______Lab Test     _______X-ray    _______ is within acceptable limits  _______ requires a medication change  _______ requires a follow-up lab visit  _______ requires a follow-up visit with your provider   Comments:  Your eye test done in March was normal.       _________________________________________________________ If you have any questions, please contact our office                     Sincerely,  Tereso Newcomer PA-C [facility]

## 2010-05-30 NOTE — Letter (Signed)
Summary: *Referral Letter  [facility]  [address] [place], [place] [address]   Phone: [phone]  Fax: [phone]    09/06/2009  Thank you in advance for agreeing to see my patient:  Haley Mendez [address] [place], [place]  [address]  Phone: [phone]  Reason for Referral: 55 yo female smoker with HTN, DM, high chol and strong FHx of premature CAD with c/o dyspnea.  Procedures Requested:   Current Medical Problems: 1)  DYSPNEA (ICD-786.05) 2)  DENTAL PAIN (ICD-525.9) 3)  FATIGUE (ICD-780.79) 4)  URINALYSIS, ABNORMAL (ICD-791.9) 5)  MENOPAUSE-RELATED VASOMOTOR SYMPTOMS, HOT FLASHES (ICD-627.2) 6)  PREVENTIVE HEALTH CARE (ICD-V70.0) 7)  CONSTIPATION (ICD-564.00) 8)  GERD (ICD-530.81) 9)  OSTEOARTHRITIS (ICD-715.90) 10)  HYPERTENSION (ICD-401.9) 11)  HYPERLIPIDEMIA (ICD-272.4) 12)  DIABETES MELLITUS, TYPE II (ICD-250.00)   Current Medications: 1)  SIMVASTATIN 40 MG TABS (SIMVASTATIN) Take 1 tab by mouth at bedtime for cholesterol 2)  FAMOTIDINE 20 MG TABS (FAMOTIDINE) take two tab by mouth daily 3)  LISINOPRIL 20 MG TABS (LISINOPRIL) one tab by mouth daily 4)  METFORMIN HCL 1000 MG TABS (METFORMIN HCL) Take 1 tablet by mouth two times a day for diabetes 5)  GLIMEPIRIDE 4 MG TABS (GLIMEPIRIDE) half tab by mouth daily 6)  ASPIR-LOW 81 MG TBEC (ASPIRIN) Take 1 tablet by mouth once a day 7)  MIRALAX  POWD (POLYETHYLENE GLYCOL 3350) dissolve one capful in one glass of water and drink once daily as needed for constipation 8)  FREESTYLE LITE TEST  STRP (GLUCOSE BLOOD) check sugar at least once daily 9)  PROVENTIL HFA 108 (90 BASE) MCG/ACT AERS (ALBUTEROL SULFATE) 1-2 puffs every 4-6 hours as needed   Past Medical History: 1)  Diabetes mellitus, type II (dx 2007) 2)  Hyperlipidemia 3)  Hypertension 4)  Osteoarthritis 5)    a.  left shoulder bone spur 6)    b.  5th toe on left foot "crooked"; saw podiatrist and rec. surgery; pt declined 7)   Left shoulder impingement - eval by Dr. Gean Birchwood 11/2008 8)  GERD 9)  Colonoscopy 08/2008 10)    a.  4 polyps removed ("benign"); incomplete prep and was to have repeat (never done) 11)    b.  [facility] in [place] (Dr. Elbert Ewings. [place]) 12)    c.  repeat planned in 2015 13)  Last Pap 08/2008 (supposed to have repeat; "not enough cells"; no h/o abnl pap) 14)    a.  [facility]  15)  Mammo 12/2008:  normal   Prior History of Blood Transfusions:   Pertinent Labs:    Thank you again for agreeing to see our patient; please contact us if you have any further questions or need additional information.  Sincerely,  Tereso Newcomer PA-C

## 2010-05-30 NOTE — Letter (Signed)
Summary: *HSN Results Follow up  [facility]  [address] [place], [place] [address]   Phone: [phone]  Fax: [phone]      06/10/2009   Haley Mendez [address] [place], [place]  [address]   Dear  [name],                            ____S.Drinkard,FNP   ____D. Gore,FNP       ____B. McPherson,MD   ____V. Rankins,MD    ____E. Mulberry,MD    ____N. Daphine Deutscher, FNP  ____D. Reche Dixon, MD    ____K. Philipp Deputy, MD    __x__S. Alben Spittle, PA-C     This letter is to inform you that your recent test(s):  _______Pap Smear    ___x____Lab Test     _______X-ray    ___x____ is within acceptable limits  _______ requires a medication change  _______ requires a follow-up lab visit  ___x____ requires a follow-up visit with your provider   Comments: Your Hemoglobin A1C was 8.1.  We want that below 7.  We increased your metformin the other day.  So, we will see how that does for you.  Otherwise, your lab work looked good.       _________________________________________________________ If you have any questions, please contact our office                     Sincerely,  Tereso Newcomer PA-C [facility]

## 2010-06-13 ENCOUNTER — Encounter (INDEPENDENT_AMBULATORY_CARE_PROVIDER_SITE_OTHER): Payer: Self-pay | Admitting: Internal Medicine

## 2010-06-13 ENCOUNTER — Encounter: Payer: Self-pay | Admitting: Internal Medicine

## 2010-06-13 LAB — CONVERTED CEMR LAB
Blood Glucose, Fingerstick: 81
Hgb A1c MFr Bld: 7.2 % — ABNORMAL HIGH (ref <5.7)

## 2010-06-21 NOTE — Assessment & Plan Note (Signed)
Summary: HTN and DM f/u per Lorin Picket   Vital Signs:  Patient profile:   55 year old female Menstrual status:  postmenopausal Weight:      163.06 pounds Temp:     97.4 degrees F oral Pulse rate:   76 / minute Pulse rhythm:   regular Resp:     20 per minute BP sitting:   134 / 82  (left arm) Cuff size:   regular  Vitals Entered By: Hale Drone CMA (June 13, 2010 11:46 AM) CC: 3 month f/u on HTN and DM. Having "hot flashes" and sweats.  Is Patient Diabetic? Yes Pain Assessment Patient in pain? no      CBG Result 81 CBG Device ID B non fasting  Does patient need assistance? Functional Status Self care Ambulation Normal   Primary Care Provider:  Tereso Newcomer PA-C  CC:  3 month f/u on HTN and DM. Having "hot flashes" and sweats. .  History of Present Illness: 55 yo female, previous pt. of Tereso Newcomer, PA-C  1.  DM:  Pt. switched from Glimeperide to Glipizide. Pt. states sugars are about the same despite the change.  Generally, sugars 80-mid 100s, but can get into 200-300 range occasionally--appears to generally be in late morning.  Having difficulty affording food and not really physically active.  Has a stationary piece of equipment at home she does not use.  Admits to eating sweet foods--craves.  States she knows she has gained weight.  2.  Tobacco Abuse:  Did not get Chantix filled--did not want to take another medication.  Still smoking 1/2 ppd.    3.  Hot flashes and night sweats.  Has had problems for 4 years.  Has not had a period since 2007.  Discussed not a great candidate for ERT.  Has not tried [employer]. Has not ever tried SSRI.   Was on ERT years ago.  States had similar symptoms since in 57s.  4.  Sleep study:  never called to get set back up.  Pt. snores.  Not clear if is apeic at times.  Has sleep difficulties, but feels more related to her hot flashes.  Very sleepy during the day.  Gets sleepy while driving in afternoon.  Does not nap during the day, but  falls asleep as above easily.  Current Medications (verified): 1)  Simvastatin 40 Mg Tabs (Simvastatin) .... Take 1 Tab By Mouth At Bedtime For Cholesterol 2)  Famotidine 20 Mg Tabs (Famotidine) .... Take Two Tab By Mouth Daily 3)  Lisinopril 20 Mg Tabs (Lisinopril) .... One Tab By Mouth Daily 4)  Metformin Hcl 1000 Mg Tabs (Metformin Hcl) .... Take 1 Tablet By Mouth Two Times A Day For Diabetes 5)  Aspir-Low 81 Mg Tbec (Aspirin) .... Take 1 Tablet By Mouth Once A Day 6)  Miralax  Powd (Polyethylene Glycol 3350) .... Dissolve One Capful in One Glass of Water and Drink Once Daily As Needed For Constipation 7)  Freestyle Lite Test  Strp (Glucose Blood) .... Check Sugar At Least Once Daily 8)  Proventil Hfa 108 (90 Base) Mcg/act Aers (Albuterol Sulfate) .Marland Kitchen.. 1-2 Puffs Every 4-6 Hours As Needed 9)  Vitamin D 1000 Unit Tabs (Cholecalciferol) .... Take 1 Tablet By Mouth Once A Day 10)  Glipizide 5 Mg Tabs (Glipizide) .... Take 1 Tablet By Mouth Once A Day For Diabetes.  Take With A Meal.  (Pharmacy:  This Replaces Glimepiride - D/c Glimepiride) 11)  Chantix Starting Month Pak 0.5 Mg X 11 &  1 Mg X 42 Tabs (Varenicline Tartrate) .... As Directed 12)  Chantix Continuing Month Pak 1 Mg Tabs (Varenicline Tartrate) .... As Directed  Allergies (verified): No Known Drug Allergies  Physical Exam  General:  Obese, NAD Lungs:  Normal respiratory effort, chest expands symmetrically. Lungs are clear to auscultation, no crackles or wheezes. Heart:  Normal rate and regular rhythm. S1 and S2 normal without gallop, murmur, click, rub or other extra sounds.  Radial pulses normal and equal Abdomen:  Bowel sounds positive,abdomen soft and non-tender without masses, organomegaly or hernias noted.  Diabetes Management Exam:    Foot Exam (with socks and/or shoes not present):       Sensory-Monofilament:          Left foot: normal          Right foot: normal   Impression & Recommendations:  Problem # 1:   TOBACCO ABUSE (ICD-305.1) Pt. not interested in quitting at this time--encouraged her to rethink this. The following medications were removed from the medication list:    Chantix Starting Month Pak 0.5 Mg X 11 & 1 Mg X 42 Tabs (Varenicline tartrate) .Marland Kitchen... As directed    Chantix Continuing Month Pak 1 Mg Tabs (Varenicline tartrate) .Marland Kitchen... As directed  Problem # 2:  MENOPAUSE-RELATED VASOMOTOR SYMPTOMS, HOT FLASHES (ICD-627.2) To try Black Cohosh. Discussed SSRI as pt. depressed with lack of employment, but not interested at this time.  Problem # 3:  FATIGUE (ICD-780.79) With Snoring--strongly urged pt. to call and reschedule Sleep Study. Not clear she is motivated.  Problem # 4:  HYPERTENSION (ICD-401.9) Controlled. Her updated medication list for this problem includes:    Lisinopril 20 Mg Tabs (Lisinopril) ..... One tab by mouth daily  Problem # 5:  DIABETES MELLITUS, TYPE II (ICD-250.00) Not clear if she is any better controlled with switch to Glipizide. Her updated medication list for this problem includes:    Lisinopril 20 Mg Tabs (Lisinopril) ..... One tab by mouth daily    Metformin Hcl 1000 Mg Tabs (Metformin hcl) .Marland Kitchen... Take 1 tablet by mouth two times a day for diabetes    Aspir-low 81 Mg Tbec (Aspirin) .Marland Kitchen... Take 1 tablet by mouth once a day    Glipizide 5 Mg Tabs (Glipizide) .Marland Kitchen... Take 1 tablet by mouth once a day for diabetes.  take with a meal.  (pharmacy:  this replaces glimepiride - d/c glimepiride)  Orders: Capillary Blood Glucose/CBG ([address]) T- Hemoglobin A1C ([address])  Complete Medication List: 1)  Simvastatin 40 Mg Tabs (Simvastatin) .... Take 1 tab by mouth at bedtime for cholesterol 2)  Famotidine 20 Mg Tabs (Famotidine) .... Take two tab by mouth daily 3)  Lisinopril 20 Mg Tabs (Lisinopril) .... One tab by mouth daily 4)  Metformin Hcl 1000 Mg Tabs (Metformin hcl) .... Take 1 tablet by mouth two times a day for diabetes 5)  Aspir-low 81 Mg Tbec (Aspirin)  .... Take 1 tablet by mouth once a day 6)  Miralax Powd (Polyethylene glycol 3350) .... Dissolve one capful in one glass of water and drink once daily as needed for constipation 7)  Freestyle Lite Test Strp (Glucose blood) .... Check sugar at least once daily 8)  Proventil Hfa 108 (90 Base) Mcg/act Aers (Albuterol sulfate) .Marland Kitchen.. 1-2 puffs every 4-6 hours as needed 9)  Vitamin D 1000 Unit Tabs (Cholecalciferol) .... Take 1 tablet by mouth once a day 10)  Glipizide 5 Mg Tabs (Glipizide) .... Take 1 tablet by mouth once a day for  diabetes.  take with a meal.  (pharmacy:  this replaces glimepiride - d/c glimepiride)  Other Orders: Flu Vaccine 103yrs + (69485) Admin 1st Vaccine ([address]270)  Patient Instructions: 1)  Try Black Cohosh for night sweats and hot flashes. 2)  Follow up with Dr. Delrae Alfred in 4 months CPP   Orders Added: 1)  Capillary Blood Glucose/CBG [82948] 2)  Flu Vaccine 1yrs + [90658] 3)  Admin 1st Vaccine [90471] 4)  T- Hemoglobin A1C [83036-23375] 5)  Est. Patient Level IV [35009]   Immunizations Administered:  Influenza Vaccine # 1:    Vaccine Type: Fluvax 3+    Site: left deltoid    Mfr: GlaxoSmithKline    Dose: 0.5 ml    Route: IM    Given by: Hale Drone CMA    Exp. Date: 10/28/2010    Lot #: [id number]    VIS given: 11/22/09 version given June 13, 2010.  Flu Vaccine Consent Questions:    Do you have a history of severe allergic reactions to this vaccine? no    Any prior history of allergic reactions to egg and/or gelatin? no    Do you have a sensitivity to the preservative Thimersol? no    Do you have a past history of Guillan-Barre Syndrome? no    Do you currently have an acute febrile illness? no    Have you ever had a severe reaction to latex? no    Vaccine information given and explained to patient? yes    Are you currently pregnant? no   Immunizations Administered:  Influenza Vaccine # 1:    Vaccine Type: Fluvax 3+    Site: left deltoid    Mfr:  GlaxoSmithKline    Dose: 0.5 ml    Route: IM    Given by: Hale Drone CMA    Exp. Date: 10/28/2010    Lot #: [id number]    VIS given: 11/22/09 version given June 13, 2010.   Diabetic Foot Exam    10-g (5.07) Semmes-Weinstein Monofilament Test Performed by: Hale Drone CMA          Right Foot          Left Foot Visual Inspection     normal         normal Test Control      normal         normal Site 1         normal         normal Site 2         normal         normal Site 3         normal         normal Site 4         normal         normal Site 5         normal         normal Site 6         normal         normal Site 7         normal         normal Site 8         normal         normal Site 9         normal         normal Site 10         normal  normal  Impression      normal         normal

## 2010-06-30 ENCOUNTER — Encounter (INDEPENDENT_AMBULATORY_CARE_PROVIDER_SITE_OTHER): Payer: Self-pay | Admitting: Internal Medicine

## 2010-07-06 NOTE — Letter (Signed)
Summary: *HSN Results Follow up  [facility]  [address] [place], [place] [address]   Phone: [phone]  Fax: [phone]      06/30/2010   Haley Mendez [address] [place], [place]  [address]   Dear  [name],                            ____S.Drinkard,FNP   ____D. Gore,FNP       ____B. McPherson,MD   ____V. Rankins,MD    __X__E. Leeloo Silverthorne,MD    ____N. Daphine Deutscher, FNP  ____D. Reche Dixon, MD    ____K. Philipp Deputy, MD    ____Other     This letter is to inform you that your recent test(s):  _______Pap Smear    ___X____Lab Test     _______X-ray    ____X___ is within acceptable limits  _______ requires a medication change  _______ requires a follow-up lab visit  _______ requires a follow-up visit with your provider   Comments:  your A1C was just a little bit better at 7.2%.  Would like you to keep working on staying away from the sweets as we discussed.  Try to make better choices with what you eat in general as well.  No changes to meds at this time.       _________________________________________________________ If you have any questions, please contact our office                     Sincerely,  Haley Manson MD [facility]

## 2010-07-16 LAB — GLUCOSE, CAPILLARY: Glucose-Capillary: 118 mg/dL — ABNORMAL HIGH (ref 70–99)

## 2010-10-19 ENCOUNTER — Other Ambulatory Visit: Payer: Self-pay | Admitting: Family Medicine

## 2010-12-30 LAB — HM PAP SMEAR: HM Pap smear: NORMAL

## 2011-01-02 ENCOUNTER — Other Ambulatory Visit (HOSPITAL_COMMUNITY): Payer: Self-pay | Admitting: Family Medicine

## 2011-01-02 DIAGNOSIS — Z1231 Encounter for screening mammogram for malignant neoplasm of breast: Secondary | ICD-10-CM

## 2011-01-22 ENCOUNTER — Ambulatory Visit (HOSPITAL_COMMUNITY)
Admission: RE | Admit: 2011-01-22 | Discharge: 2011-01-22 | Disposition: A | Discharge status: Home / Self Care | Payer: Self-pay | Source: Ambulatory Visit | Attending: Family Medicine | Admitting: Family Medicine

## 2011-01-22 DIAGNOSIS — Z1231 Encounter for screening mammogram for malignant neoplasm of breast: Secondary | ICD-10-CM | POA: Insufficient documentation

## 2012-04-07 ENCOUNTER — Other Ambulatory Visit (HOSPITAL_COMMUNITY): Payer: Self-pay | Admitting: Internal Medicine

## 2012-04-07 DIAGNOSIS — Z1231 Encounter for screening mammogram for malignant neoplasm of breast: Secondary | ICD-10-CM

## 2012-04-10 ENCOUNTER — Other Ambulatory Visit: Payer: Self-pay | Admitting: Internal Medicine

## 2012-04-10 ENCOUNTER — Encounter: Payer: Self-pay | Admitting: Internal Medicine

## 2012-04-10 ENCOUNTER — Ambulatory Visit (INDEPENDENT_AMBULATORY_CARE_PROVIDER_SITE_OTHER): Payer: BC Managed Care – PPO | Admitting: Internal Medicine

## 2012-04-10 ENCOUNTER — Other Ambulatory Visit (INDEPENDENT_AMBULATORY_CARE_PROVIDER_SITE_OTHER): Payer: BC Managed Care – PPO

## 2012-04-10 VITALS — BP 130/92 | HR 66 | Temp 98.1°F | Ht 62.75 in | Wt 151.0 lb

## 2012-04-10 DIAGNOSIS — E785 Hyperlipidemia, unspecified: Secondary | ICD-10-CM

## 2012-04-10 DIAGNOSIS — Z79899 Other long term (current) drug therapy: Secondary | ICD-10-CM

## 2012-04-10 DIAGNOSIS — Z1321 Encounter for screening for nutritional disorder: Secondary | ICD-10-CM

## 2012-04-10 DIAGNOSIS — B351 Tinea unguium: Secondary | ICD-10-CM | POA: Insufficient documentation

## 2012-04-10 DIAGNOSIS — Z131 Encounter for screening for diabetes mellitus: Secondary | ICD-10-CM

## 2012-04-10 DIAGNOSIS — Z0000 Encounter for general adult medical examination without abnormal findings: Secondary | ICD-10-CM

## 2012-04-10 DIAGNOSIS — Z130 Encounter for screening for diseases of the blood and blood-forming organs and certain disorders involving the immune mechanism: Secondary | ICD-10-CM

## 2012-04-10 DIAGNOSIS — Z1329 Encounter for screening for other suspected endocrine disorder: Secondary | ICD-10-CM

## 2012-04-10 DIAGNOSIS — Z1331 Encounter for screening for depression: Secondary | ICD-10-CM

## 2012-04-10 DIAGNOSIS — E119 Type 2 diabetes mellitus without complications: Secondary | ICD-10-CM

## 2012-04-10 DIAGNOSIS — E1165 Type 2 diabetes mellitus with hyperglycemia: Secondary | ICD-10-CM

## 2012-04-10 DIAGNOSIS — I10 Essential (primary) hypertension: Secondary | ICD-10-CM | POA: Insufficient documentation

## 2012-04-10 DIAGNOSIS — Z23 Encounter for immunization: Secondary | ICD-10-CM

## 2012-04-10 DIAGNOSIS — Z5181 Encounter for therapeutic drug level monitoring: Secondary | ICD-10-CM

## 2012-04-10 HISTORY — DX: Tinea unguium: B35.1

## 2012-04-10 LAB — HEMOGLOBIN A1C: Hgb A1c MFr Bld: 7.8 % — ABNORMAL HIGH (ref 4.6–6.5)

## 2012-04-10 LAB — CBC
HCT: 38.6 % (ref 36.0–46.0)
Hemoglobin: 12.6 g/dL (ref 12.0–15.0)
MCHC: 32.5 g/dL (ref 30.0–36.0)
MCV: 83.3 fl (ref 78.0–100.0)
RDW: 15.9 % — ABNORMAL HIGH (ref 11.5–14.6)
WBC: 6.8 10*3/uL (ref 4.5–10.5)

## 2012-04-10 LAB — BASIC METABOLIC PANEL
BUN: 14 mg/dL (ref 6–23)
Calcium: 9.7 mg/dL (ref 8.4–10.5)
Chloride: 105 mEq/L (ref 96–112)
Creatinine, Ser: 0.6 mg/dL (ref 0.4–1.2)

## 2012-04-10 LAB — LIPID PANEL: Total CHOL/HDL Ratio: 3

## 2012-04-10 LAB — HEPATIC FUNCTION PANEL
AST: 24 U/L (ref 0–37)
Alkaline Phosphatase: 103 U/L (ref 39–117)
Bilirubin, Direct: 0 mg/dL (ref 0.0–0.3)
Total Bilirubin: 0.7 mg/dL (ref 0.3–1.2)

## 2012-04-10 LAB — MICROALBUMIN / CREATININE URINE RATIO
Creatinine,U: 84.6 mg/dL
Microalb Creat Ratio: 2 mg/g (ref 0.0–30.0)
Microalb, Ur: 1.7 mg/dL (ref 0.0–1.9)

## 2012-04-10 LAB — TSH: TSH: 1.71 u[IU]/mL (ref 0.35–5.50)

## 2012-04-10 MED ORDER — GLUCOSE BLOOD VI STRP: ORAL_STRIP | Status: DC

## 2012-04-10 MED ORDER — CICLOPIROX OLAMINE 0.77 % EX CREA: TOPICAL_CREAM | Freq: Two times a day (BID) | CUTANEOUS | Status: DC

## 2012-04-10 MED ORDER — GLIPIZIDE 5 MG PO TABS: 10.0000 mg | ORAL_TABLET | Freq: Every day | ORAL | Status: DC

## 2012-04-10 MED ORDER — QUINAPRIL HCL 20 MG PO TABS: 40.0000 mg | ORAL_TABLET | Freq: Every day | ORAL | Status: DC

## 2012-04-10 NOTE — Addendum Note (Signed)
Addended by: Carin Primrose on: 04/10/2012 12:22 PM   Modules accepted: Orders

## 2012-04-10 NOTE — Progress Notes (Signed)
HPI  Pt presents to the clinic today to establish care. She does have concerns about her toenails. She has noticed that they have gotten thicker and darker in color in the past 2 months. She has never been evaluated for foot fungus that she remembers.  Colonoscopy: 2010 (5 years) Flu: 03/19/2012 Pneumovax: never Tetanus: greater than 10 years Eye exam: Never Pap smear: 05/2010 Mammogram: scheduled 04/25/2012 LMP: 2007, post menopausal  Past Medical History  Diagnosis Date  . Arthritis   . Bone spur of other site     left rotator cuff  . Diabetes mellitus without complication   . Hypertension   . Hyperlipidemia   . History of colon polyps     Current Outpatient Prescriptions  Medication Sig Dispense Refill  . atorvastatin (LIPITOR) 20 MG tablet Take 20 mg by mouth daily.       Marland Kitchen glipiZIDE (GLUCOTROL) 5 MG tablet Take 5 mg by mouth daily.       Marland Kitchen LISINOPRIL PO Take 1 tablet by mouth daily. Pt unsure of dosage      . metFORMIN (GLUCOPHAGE) 1000 MG tablet Take 1,000 mg by mouth 2 (two) times daily with a meal.       . quinapril (ACCUPRIL) 20 MG tablet Take 2 tablets (40 mg total) by mouth daily.  30 tablet  0  . [DISCONTINUED] quinapril (ACCUPRIL) 20 MG tablet Take 20 mg by mouth daily.       . ciclopirox (LOPROX) 0.77 % cream Apply topically 2 (two) times daily.  15 g  0    No Known Allergies  Family History  Problem Relation Age of Onset  . Early death Mother   . Diabetes Father   . Hypertension Father   . Diabetes Brother   . Diabetes Brother     History   Social History  . Marital Status: Single    Spouse Name: N/A    Number of Children: 2  . 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 Smoking status: Current Every Day Smoker -- 0.5 packs/day for 15 years    Types: Cigarettes  . Smokeless tobacco: Never Used  . Alcohol Use: No  . Drug Use: No  . Sexually Active: Not on file   Other Topics Concern  . Not  on file   Social History Narrative  . No narrative on file    ROS:  Constitutional: Denies fever, malaise, fatigue, headache or abrupt weight changes.  HEENT: Denies eye pain, eye redness, ear pain, ringing in the ears, wax buildup, runny nose, nasal congestion, bloody nose, or sore throat. Respiratory: Denies difficulty breathing, shortness of breath, cough or sputum production.   Cardiovascular: Denies chest pain, chest tightness, palpitations or swelling in the hands or feet.  Gastrointestinal: Denies abdominal pain, bloating, constipation, diarrhea or blood in the stool.  GU: Denies frequency, urgency, pain with urination, blood in urine, odor or discharge. Musculoskeletal: Denies decrease in range of motion, difficulty with gait, muscle pain or joint pain and swelling.  Skin: Pt does report thickening and darkening of her toenails. Denies redness, rashes, lesions or ulcercations.  Neurological: Denies dizziness, difficulty with memory, difficulty with speech or problems with balance and coordination.   No other specific complaints in a complete review of systems (except as listed in HPI above).  PE:  BP 130/92  Pulse 66  Temp 98.1 F (36.7 C) (Oral)  Ht 5' 2.75" (1.594 m)  Wt 151 lb (68.493 kg)  BMI 26.96 kg/m2  SpO2 97% Wt Readings from Last 3 Encounters:  04/10/12 151 lb (68.493 kg)  06/13/10 163 lb 1 oz (73.965 kg)  12/30/09 159 lb (72.122 kg)    General: Appears her stated age, overweight but well developed, well nourished in NAD. Skin: Thickened toenails, long and curled, dark with evidence of fungal infection. HEENT: Head: normal shape and size; Eyes: sclera white, no icterus, conjunctiva pink, PERRLA and EOMs intact; Ears: Tm's gray and intact, normal light reflex; Nose: mucosa pink and moist, septum midline; Throat/Mouth: Teeth present, mucosa pink and moist, no lesions or ulcerations noted.  Neck: Normal range of motion. Neck supple, trachea midline. No massses,  lumps noted. Thyromegaly present. Cardiovascular: Normal rate and rhythm. S1,S2 noted.  No murmur, rubs or gallops noted. No JVD or BLE edema. No carotid bruits noted. Pulmonary/Chest: Normal effort and positive vesicular breath sounds. No respiratory distress. No wheezes, rales or ronchi noted.  Abdomen: Soft and nontender. Normal bowel sounds, no bruits noted. No distention or masses noted. Liver, spleen and kidneys non palpable. Musculoskeletal: Normal range of motion. No signs of joint swelling. No difficulty with gait.  Neurological: Alert and oriented. Cranial nerves II-XII intact. Coordination normal. +DTRs bilaterally. Decreased sensation to light/sharp touch in feet bilaterally. Psychiatric: Mood and affect normal. Behavior is normal. Judgment and thought content normal.    BMET    Component Value Date/Time   NA 140 12/30/2009 2138   K 4.4 12/30/2009 2138   CL 105 12/30/2009 2138   CO2 21 12/30/2009 2138   GLUCOSE 119* 12/30/2009 2138   BUN 16 12/30/2009 2138   CREATININE 0.63 12/30/2009 2138   CALCIUM 10.1 12/30/2009 2138    Lipid Panel     Component Value Date/Time   CHOL 124 09/14/2009 0951   TRIG 54 09/14/2009 0951   HDL 45 09/14/2009 0951   CHOLHDL 2.8 Ratio 09/14/2009 0951   VLDL 11 09/14/2009 0951   LDLCALC 68 09/14/2009 0951    CBC    Component Value Date/Time   WBC 8.8 06/09/2009 2152   RBC 4.83 06/09/2009 2152   HGB 13.2 06/09/2009 2152   HCT 40.0 06/09/2009 2152   PLT 257 06/09/2009 2152   MCV 82.8 06/09/2009 2152   MCHC 33.0 06/09/2009 2152   RDW 15.9* 06/09/2009 2152   LYMPHSABS 3.9 06/09/2009 2152   MONOABS 0.7 06/09/2009 2152   EOSABS 0.2 06/09/2009 2152   BASOSABS 0.0 06/09/2009 2152    Hgb A1C Lab Results  Component Value Date   HGBA1C 7.2* 06/13/2010     Assessment and Plan:  Preventative Health Maintenance:  Stop smoking, cessation tips given Start a diet and exercise program Will obtain basic screening labs today: CBC, VIT D, BMET Have mammogram done as  schedule Will give pneumovax and tdap today Schedule pap smear.  Hypertension, uncontrolled:  Increase accupril to 40 mg daily Monitor blood pressure daily Encourage low fat, low salt diet Exercise for at least 30 minutes 4 days out of the week  Hyperlipidemia:  Continue Zocor Will check lipids and LFT's today Encourage low fat diet Exercise for at least 30 minutes 4 days out of the week  Diabetes Type 2:  Will check HgBA1C today Foot exam performed today Will schedule dilated eye exam in 04/2012 Continue current medication regimen  Onnchymyosis, new onset with additional workup reuired:  Will prescribe cream to place on toes daily  RTC in 1 month for blood pressure check and  pap smear

## 2012-04-10 NOTE — Progress Notes (Signed)
A1C elevated. Will increase glipizide to 10 mg daily.

## 2012-04-10 NOTE — Patient Instructions (Addendum)

## 2012-04-11 ENCOUNTER — Telehealth: Payer: Self-pay | Admitting: *Deleted

## 2012-04-11 NOTE — Telephone Encounter (Signed)
Pt informed of results and of NP's advisement.     Ash,  Can you please send this and call Ms Eckles and let her know that her A1C is elevated indicating that her diabetes is not under good control. We need to increase her glipizide to 10 mg daily. Until she runs out of her current supply, instruct her to take 2 tablets daily. Also, schedule an OV for 3 months to reevaluate. Thx!  Rene Kocher

## 2012-04-22 ENCOUNTER — Ambulatory Visit (HOSPITAL_COMMUNITY): Payer: Self-pay

## 2012-04-25 ENCOUNTER — Ambulatory Visit (HOSPITAL_COMMUNITY): Payer: Self-pay

## 2012-05-01 ENCOUNTER — Telehealth: Payer: Self-pay | Admitting: *Deleted

## 2012-05-01 MED ORDER — ONETOUCH DELICA LANCETS FINE MISC: 1.0000 | Freq: Every day | Status: DC

## 2012-05-01 MED ORDER — GLUCOSE BLOOD VI STRP: ORAL_STRIP | Status: DC

## 2012-05-01 NOTE — Telephone Encounter (Signed)
PA needed for Freestyle Lite test strips-PA sent in and denied-alternatives are [employer] or [facility]. Informed pt to come in to pick up Lum Babe IQ meter and rx for testing supplies sent to [employer].

## 2012-05-12 ENCOUNTER — Other Ambulatory Visit: Payer: Self-pay | Admitting: Internal Medicine

## 2012-05-12 DIAGNOSIS — I10 Essential (primary) hypertension: Secondary | ICD-10-CM

## 2012-05-12 MED ORDER — QUINAPRIL HCL 20 MG PO TABS: 40.0000 mg | ORAL_TABLET | Freq: Every day | ORAL | Status: DC

## 2012-05-12 NOTE — Telephone Encounter (Signed)
Ok to refill 

## 2012-05-12 NOTE — Telephone Encounter (Signed)
Medication refill sent to [facility]

## 2012-05-12 NOTE — Telephone Encounter (Signed)
Patient is requesting a refill on her accupril be sent to [facility] on [address]

## 2012-05-19 ENCOUNTER — Ambulatory Visit: Payer: BC Managed Care – PPO | Admitting: Internal Medicine

## 2012-05-20 ENCOUNTER — Other Ambulatory Visit: Payer: Self-pay | Admitting: Internal Medicine

## 2012-08-18 ENCOUNTER — Telehealth: Payer: Self-pay | Admitting: Internal Medicine

## 2012-08-18 DIAGNOSIS — E1165 Type 2 diabetes mellitus with hyperglycemia: Secondary | ICD-10-CM

## 2012-08-18 MED ORDER — GLIPIZIDE 5 MG PO TABS: 10.0000 mg | ORAL_TABLET | Freq: Every day | ORAL | Status: DC

## 2012-08-18 MED ORDER — METFORMIN HCL 1000 MG PO TABS: 1000.0000 mg | ORAL_TABLET | Freq: Two times a day (BID) | ORAL | Status: DC

## 2012-08-18 MED ORDER — ATORVASTATIN CALCIUM 20 MG PO TABS: 20.0000 mg | ORAL_TABLET | Freq: Every day | ORAL | Status: DC

## 2012-08-18 MED ORDER — QUINAPRIL HCL 40 MG PO TABS: ORAL_TABLET | ORAL | Status: DC

## 2012-08-18 NOTE — Telephone Encounter (Signed)
Pt req refill to be call into [facility], Metformin, Lipitor, Accupril and Glip-Zide. Please advise.

## 2012-08-18 NOTE — Telephone Encounter (Signed)
Rx's sent to [facility], pt informed via VM.

## 2012-08-26 ENCOUNTER — Encounter: Payer: Self-pay | Admitting: Internal Medicine

## 2012-08-26 ENCOUNTER — Ambulatory Visit (INDEPENDENT_AMBULATORY_CARE_PROVIDER_SITE_OTHER): Payer: BC Managed Care – PPO | Admitting: Internal Medicine

## 2012-08-26 ENCOUNTER — Other Ambulatory Visit (INDEPENDENT_AMBULATORY_CARE_PROVIDER_SITE_OTHER): Payer: BC Managed Care – PPO

## 2012-08-26 VITALS — BP 138/82 | HR 72 | Temp 98.4°F | Ht 62.75 in | Wt 144.0 lb

## 2012-08-26 DIAGNOSIS — E119 Type 2 diabetes mellitus without complications: Secondary | ICD-10-CM

## 2012-08-26 DIAGNOSIS — R109 Unspecified abdominal pain: Secondary | ICD-10-CM

## 2012-08-26 LAB — BASIC METABOLIC PANEL
BUN: 13 mg/dL (ref 6–23)
CO2: 24 mEq/L (ref 19–32)
Chloride: 106 mEq/L (ref 96–112)
Creatinine, Ser: 0.7 mg/dL (ref 0.4–1.2)
Potassium: 4.4 mEq/L (ref 3.5–5.1)

## 2012-08-26 LAB — URINALYSIS
Bilirubin Urine: NEGATIVE
Nitrite: NEGATIVE
Total Protein, Urine: NEGATIVE
Urine Glucose: NEGATIVE
pH: 6 (ref 5.0–8.0)

## 2012-08-26 LAB — CBC
Hemoglobin: 13.3 g/dL (ref 12.0–15.0)
MCHC: 33.1 g/dL (ref 30.0–36.0)
Platelets: 241 10*3/uL (ref 150.0–400.0)
RBC: 4.84 Mil/uL (ref 3.87–5.11)
WBC: 9.8 10*3/uL (ref 4.5–10.5)

## 2012-08-26 MED ORDER — IBUPROFEN 800 MG PO TABS: 800.0000 mg | ORAL_TABLET | Freq: Three times a day (TID) | ORAL | Status: DC | PRN

## 2012-08-26 NOTE — Patient Instructions (Signed)
Flank Pain  Flank pain refers to pain that is located on the side of the body between the upper abdomen and the back. It can be caused by many things.  CAUSES   Some of the more common causes of flank pain include:   Muscle strain.   Muscle spasms.   A disease of your spine (vertebral disk disease).   A lung infection (pneumonia).   Fluid around your lungs (pulmonary edema).   A kidney infection.   Kidney stones.   A very painful skin rash on only one side of your body (shingles).   Gallbladder disease.  DIAGNOSIS   Blood tests, urine tests, and X-rays may help your caregiver determine what is wrong.  TREATMENT   The treatment of pain depends on the cause. Your caregiver will determine what treatment will work best for you.  HOME CARE INSTRUCTIONS    Home care will depend on the cause of your pain.   Some medications may help relieve the pain. Take medication for relief of pain as directed by your caregiver.   Tell your caregiver about any changes in your pain.   Follow up with your caregiver.  SEEK IMMEDIATE MEDICAL CARE IF:    Your pain is not controlled with medication.   The pain increases.   You have abdominal pain.   You have shortness of breath.   You have persistent nausea or vomiting.   You have swelling in your abdomen.   You feel faint or pass out.   You have a temperature by mouth above 102 F (38.9 C), not controlled by medicine.  MAKE SURE YOU:    Understand these instructions.   Will watch your condition.   Will get help right away if you are not doing well or get worse.  Document Released: 06/07/2005 Document Revised: 07/09/2011 Document Reviewed: 10/01/2009  Exit[employer] Patient Information 2013 [employer].

## 2012-08-26 NOTE — Progress Notes (Signed)
Subjective:    Patient ID: Haley Mendez, female    DOB: 06-08-1955, 57 y.o.   MRN: [id number]  HPI  Pt presents to the clinic today with c/o left flank pain x 2 weeks. The pain is intermittent. It is sometimes sharp and sometimes dull. She denies urinary symptoms, fever, chills, nausea or vomiting. The pain is not worse after she eats. It is not worse with bending and moving. She is unable to reproduce the pain. She thinks it may be arthritis. She does not take anything for the pain. She does have regular BM. She does not have blood in her stool.  Review of Systems   Past Medical History  Diagnosis Date  . Arthritis   . Bone spur of other site     left rotator cuff  . Diabetes mellitus without complication   . Hypertension   . Hyperlipidemia   . History of colon polyps     Current Outpatient Prescriptions  Medication Sig Dispense Refill  . atorvastatin (LIPITOR) 20 MG tablet Take 1 tablet (20 mg total) by mouth daily.  30 tablet  5  . glipiZIDE (GLUCOTROL) 5 MG tablet Take 5 mg by mouth daily.      Marland Kitchen glucose blood (ONETOUCH VERIO IQ) test strip Use as instructed to test blood sugar once daily dx 250.00  100 each  3  . metFORMIN (GLUCOPHAGE) 1000 MG tablet Take 1 tablet (1,000 mg total) by mouth 2 (two) times daily with a meal.  60 tablet  5  . ONETOUCH DELICA LANCETS FINE MISC 1 each by Does not apply route daily. Dx 250.00  100 each  3  . quinapril (ACCUPRIL) 40 MG tablet TAKE ONE TABLET BY MOUTH EVERY DAY  30 tablet  5  . ciclopirox (LOPROX) 0.77 % cream Apply topically 2 (two) times daily.  15 g  0  . ibuprofen (ADVIL,MOTRIN) 800 MG tablet Take 1 tablet (800 mg total) by mouth every 8 (eight) hours as needed for pain.  30 tablet  0   No current facility-administered medications for this visit.    No Known Allergies  Family History  Problem Relation Age of Onset  . Early death Mother   . Diabetes Father   . Hypertension Father   . Diabetes Brother   . Diabetes Brother      History   Social History  . Marital Status: Single    Spouse Name: N/A    Number of Children: 2  . 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 Smoking status: Current Every Day Smoker -- 0.50 packs/day for 15 years    Types: Cigarettes  . Smokeless tobacco: Never Used  . Alcohol Use: No  . Drug Use: No  . Sexually Active: Not on file   Other Topics Concern  . Not on file   Social History Narrative  . No narrative on file     Constitutional: Denies fever, malaise, fatigue, headache or abrupt weight changes.   Gastrointestinal: Denies abdominal pain, bloating, constipation, diarrhea or blood in the stool.  GU: Pt reports left flank pain.  Denies urgency, frequency, pain with urination, burning sensation, blood in urine, odor or discharge. Musculoskeletal: Denies decrease in range of motion, difficulty with gait, muscle pain or joint pain and swelling.    No other specific complaints in a complete review of systems (except as listed in HPI above).  Objective:   Physical Exam  BP 138/82  Pulse 72  Temp(Src) 98.4 F (36.9 C) (Oral)  Ht 5' 2.75" (1.594 m)  Wt 144 lb (65.318 kg)  BMI 25.71 kg/m2  SpO2 99% Wt Readings from Last 3 Encounters:  08/26/12 144 lb (65.318 kg)  04/10/12 151 lb (68.493 kg)  06/13/10 163 lb 1 oz (73.965 kg)    General: Appears her stated age, well developed, well nourished in NAD. Cardiovascular: Normal rate and rhythm. S1,S2 noted.  No murmur, rubs or gallops noted. No JVD or BLE edema. No carotid bruits noted. Pulmonary/Chest: Normal effort and positive vesicular breath sounds. No respiratory distress. No wheezes, rales or ronchi noted.  Abdomen: Soft and nontender. Normal bowel sounds, no bruits noted. No distention or masses noted. Liver, spleen and kidneys non palpable. No CVA tenderness. Musculoskeletal: Normal range of motion. No signs of joint swelling. No difficulty with gait.          Assessment & Plan:   Left flank pain, ? musculoskeletal in origin:  Will obtain BMET and CBC Will obtain urinalysis to r/o infection eRx for Ibuprofen 800 mg   Will f/u after lab results back

## 2012-08-27 ENCOUNTER — Telehealth: Payer: Self-pay

## 2012-08-27 NOTE — Telephone Encounter (Signed)
Phone call from pt requesting her lab results. I let her know all labs are normal. If she continues to have to give the office a call.

## 2012-08-28 ENCOUNTER — Telehealth: Payer: Self-pay | Admitting: *Deleted

## 2012-08-28 NOTE — Telephone Encounter (Signed)
ok 

## 2012-08-28 NOTE — Telephone Encounter (Signed)
Pt is requesting work note for appointment from other day-if okay I will generate for you to sign.

## 2012-08-28 NOTE — Telephone Encounter (Signed)
Pt informed work note ready via VM and to callback office with any question/concerns, placed upfront in cabinet for pickup.

## 2012-10-20 ENCOUNTER — Telehealth: Payer: Self-pay | Admitting: *Deleted

## 2012-10-20 MED ORDER — QUINAPRIL HCL 40 MG PO TABS: ORAL_TABLET | ORAL | Status: DC

## 2012-10-20 MED ORDER — ATORVASTATIN CALCIUM 20 MG PO TABS: 20.0000 mg | ORAL_TABLET | Freq: Every day | ORAL | Status: DC

## 2012-10-20 MED ORDER — GLIPIZIDE 5 MG PO TABS: 5.0000 mg | ORAL_TABLET | Freq: Every day | ORAL | Status: DC

## 2012-10-20 MED ORDER — METFORMIN HCL 1000 MG PO TABS: 1000.0000 mg | ORAL_TABLET | Freq: Two times a day (BID) | ORAL | Status: DC

## 2012-10-20 NOTE — Telephone Encounter (Signed)
Pt left message on VM on Friday requesting refills of Lipitor and Metformin. Informed pt that refills were sent to [facility] on 07/2012 with 5 refills and that refills are still available on rx. Pt states that [facility] doesn't have a record of these refills and has been contacting previous MD. Informed pt I will resend all prescriptions to [facility].

## 2012-11-03 ENCOUNTER — Other Ambulatory Visit: Payer: Self-pay | Admitting: *Deleted

## 2012-11-03 MED ORDER — METFORMIN HCL 1000 MG PO TABS: 1000.0000 mg | ORAL_TABLET | Freq: Two times a day (BID) | ORAL | Status: DC

## 2012-11-19 ENCOUNTER — Telehealth: Payer: Self-pay | Admitting: Internal Medicine

## 2012-11-19 MED ORDER — GLIPIZIDE 5 MG PO TABS: 5.0000 mg | ORAL_TABLET | Freq: Every day | ORAL | Status: DC

## 2012-11-19 MED ORDER — QUINAPRIL HCL 40 MG PO TABS: ORAL_TABLET | ORAL | Status: DC

## 2012-11-19 NOTE — Telephone Encounter (Signed)
Pt needs refills on Quinapril and Glipizide.  She uses [facility] on [address].

## 2012-11-19 NOTE — Telephone Encounter (Signed)
Refill done, msg left to make pt aware she is due for an OV.

## 2012-11-19 NOTE — Telephone Encounter (Signed)
Ok to refill for 30 days. She needs an OV before further refills provided.

## 2013-01-08 ENCOUNTER — Ambulatory Visit (INDEPENDENT_AMBULATORY_CARE_PROVIDER_SITE_OTHER): Payer: BC Managed Care – PPO | Admitting: Internal Medicine

## 2013-01-08 ENCOUNTER — Encounter: Payer: Self-pay | Admitting: Internal Medicine

## 2013-01-08 VITALS — BP 172/90 | HR 67 | Temp 99.7°F | Wt 144.8 lb

## 2013-01-08 DIAGNOSIS — I10 Essential (primary) hypertension: Secondary | ICD-10-CM

## 2013-01-08 DIAGNOSIS — Z130 Encounter for screening for diseases of the blood and blood-forming organs and certain disorders involving the immune mechanism: Secondary | ICD-10-CM

## 2013-01-08 DIAGNOSIS — E785 Hyperlipidemia, unspecified: Secondary | ICD-10-CM

## 2013-01-08 DIAGNOSIS — M545 Low back pain, unspecified: Secondary | ICD-10-CM

## 2013-01-08 DIAGNOSIS — Z1329 Encounter for screening for other suspected endocrine disorder: Secondary | ICD-10-CM

## 2013-01-08 DIAGNOSIS — E119 Type 2 diabetes mellitus without complications: Secondary | ICD-10-CM

## 2013-01-08 DIAGNOSIS — B351 Tinea unguium: Secondary | ICD-10-CM

## 2013-01-08 DIAGNOSIS — Z1239 Encounter for other screening for malignant neoplasm of breast: Secondary | ICD-10-CM

## 2013-01-08 DIAGNOSIS — Z13228 Encounter for screening for other metabolic disorders: Secondary | ICD-10-CM

## 2013-01-08 DIAGNOSIS — Z1321 Encounter for screening for nutritional disorder: Secondary | ICD-10-CM

## 2013-01-08 LAB — COMPREHENSIVE METABOLIC PANEL
Alkaline Phosphatase: 102 U/L (ref 39–117)
BUN: 13 mg/dL (ref 6–23)
CO2: 27 mEq/L (ref 19–32)
GFR: 122.47 mL/min (ref >60.00)
Glucose, Bld: 73 mg/dL (ref 70–99)
Total Bilirubin: 0.4 mg/dL (ref 0.3–1.2)

## 2013-01-08 LAB — CBC
HCT: 40.8 % (ref 36.0–46.0)
Hemoglobin: 13.1 g/dL (ref 12.0–15.0)
MCV: 83.4 fl (ref 78.0–100.0)
Platelets: 264 10*3/uL (ref 150.0–400.0)
RBC: 4.89 Mil/uL (ref 3.87–5.11)

## 2013-01-08 LAB — MICROALBUMIN / CREATININE URINE RATIO
Creatinine,U: 106.8 mg/dL
Microalb, Ur: 3.9 mg/dL — ABNORMAL HIGH (ref 0.0–1.9)

## 2013-01-08 LAB — LIPID PANEL
Cholesterol: 161 mg/dL (ref 0–200)
LDL Cholesterol: 98 mg/dL (ref 0–99)
Triglycerides: 101 mg/dL (ref 0.0–149.0)
VLDL: 20.2 mg/dL (ref 0.0–40.0)

## 2013-01-08 MED ORDER — ONETOUCH DELICA LANCETS FINE MISC: 1.0000 | Freq: Every day | Status: DC

## 2013-01-08 MED ORDER — GLUCOSE BLOOD VI STRP: 1.0000 | ORAL_STRIP | Freq: Every day | Status: DC

## 2013-01-08 MED ORDER — CICLOPIROX OLAMINE 0.77 % EX CREA: TOPICAL_CREAM | Freq: Two times a day (BID) | CUTANEOUS | Status: DC

## 2013-01-08 MED ORDER — ONETOUCH ULTRA 2 W/DEVICE KIT: PACK | Status: DC

## 2013-01-08 NOTE — Progress Notes (Signed)
Subjective:    Patient ID: Haley Mendez, female    DOB: 01-31-1956, 57 y.o.   MRN: [id number]  HPI  Pt presents to the clinic today with c/o worsening back pain. This is a recurrent issue for her. 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 She feels like it has gotten worse over the last few months. She was taking Advil but reports it did not help. She would like to be put back on her Naprosyn. She denies radiating pain down her legs. She denies loss of bowel or bladder. Additionally, she is here to f/u chronic medical conditions:  HLD: on lipitor. No muscle cramps or myalgias. Not really working on diet or exercise.  DM2: does not check sugars. Has not gained any weight. No issues with metformin or glipizide.  HTN: on accupril. Elevated today because her "back is hurting so bad".   Review of Systems      Past Medical History  Diagnosis Date  . Arthritis   . Bone spur of other site     left rotator cuff  . Diabetes mellitus without complication   . Hypertension   . Hyperlipidemia   . History of colon polyps     Current Outpatient Prescriptions  Medication Sig Dispense Refill  . atorvastatin (LIPITOR) 20 MG tablet Take 1 tablet (20 mg total) by mouth daily.  30 tablet  5  . Blood Glucose Monitoring Suppl (ONE TOUCH ULTRA 2) W/DEVICE KIT Use to check blood sugars Dx 250.00  1 each  0  . ciclopirox (LOPROX) 0.77 % cream Apply topically 2 (two) times daily.  15 g  0  . glipiZIDE (GLUCOTROL) 5 MG tablet Take 1 tablet (5 mg total) by mouth daily.  30 tablet  0  . glucose blood (ONE TOUCH TEST STRIPS) test strip 1 each by Other route daily. Use to check blood sugar daily Dx 250.00  100 each  3  . ibuprofen (ADVIL,MOTRIN) 800 MG tablet Take 1 tablet (800 mg total) by mouth every 8 (eight) hours as needed for pain.  30 tablet  0  . metFORMIN (GLUCOPHAGE) 1000 MG tablet Take 1 tablet (1,000 mg total) by mouth 2 (two) times daily with a meal.  180 tablet  2  .  naproxen (NAPROSYN) 500 MG tablet Take 500 mg by mouth 2 (two) times daily with a meal.      . ONETOUCH DELICA LANCETS FINE MISC 1 each by Does not apply route daily. Dx 250.00  100 each  3  . quinapril (ACCUPRIL) 40 MG tablet TAKE ONE TABLET BY MOUTH EVERY DAY  30 tablet  0   No current facility-administered medications for this visit.    No Known Allergies  Family History  Problem Relation Age of Onset  . Early death Mother   . Diabetes Father   . Hypertension Father   . Diabetes Brother   . Diabetes Brother     History   Social History  . Marital Status: Single    Spouse Name: N/A    Number of Children: 2  . 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 Smoking status: Current Every Day Smoker -- 0.50 packs/day for 15 years    Types: Cigarettes  . Smokeless tobacco: Never Used  . Alcohol Use: No  . Drug Use: No  . Sexual Activity: Not on file   Other Topics Concern  .  Not on file   Social History Narrative  . No narrative on file     Constitutional: Denies fever, malaise, fatigue, headache or abrupt weight changes.  Respiratory: Denies difficulty breathing, shortness of breath, cough or sputum production.   Cardiovascular: Denies chest pain, chest tightness, palpitations or swelling in the hands or feet.  Musculoskeletal: Pt reports back pain. Denies decrease in range of motion, difficulty with gait, muscle pain or joint pain and swelling.   Neurological: Denies dizziness, difficulty with memory, difficulty with speech or problems with balance and coordination.   No other specific complaints in a complete review of systems (except as listed in HPI above).  Objective:   Physical Exam  BP 172/90  Pulse 67  Temp(Src) 99.7 F (37.6 C) (Oral)  Wt 144 lb 12.8 oz (65.681 kg)  BMI 25.85 kg/m2  SpO2 99% Wt Readings from Last 3 Encounters:  01/08/13 144 lb 12.8 oz (65.681 kg)  08/26/12 144 lb (65.318 kg)   04/10/12 151 lb (68.493 kg)    General: Appears her stated age, well developed, well nourished in NAD. Cardiovascular: Normal rate and rhythm. S1,S2 noted.  No murmur, rubs or gallops noted. No JVD or BLE edema. No carotid bruits noted. Pulmonary/Chest: Normal effort and positive vesicular breath sounds. No respiratory distress. No wheezes, rales or ronchi noted.  Musculoskeletal: Normal range of motion. No signs of joint swelling. No difficulty with gait.  Neurological: Alert and oriented. Cranial nerves II-XII intact. Coordination normal. +DTRs bilaterally.   BMET    Component Value Date/Time   NA 141 08/26/2012 1440   K 4.4 08/26/2012 1440   CL 106 08/26/2012 1440   CO2 24 08/26/2012 1440   GLUCOSE 90 08/26/2012 1440   BUN 13 08/26/2012 1440   CREATININE 0.7 08/26/2012 1440   CALCIUM 9.5 08/26/2012 1440    Lipid Panel     Component Value Date/Time   CHOL 117 04/10/2012 1032   TRIG 42.0 04/10/2012 1032   HDL 34.90* 04/10/2012 1032   CHOLHDL 3 04/10/2012 1032   VLDL 8.4 04/10/2012 1032   LDLCALC 74 04/10/2012 1032    CBC    Component Value Date/Time   WBC 9.8 08/26/2012 1440   RBC 4.84 08/26/2012 1440   HGB 13.3 08/26/2012 1440   HCT 40.2 08/26/2012 1440   PLT 241.0 08/26/2012 1440   MCV 83.1 08/26/2012 1440   MCHC 33.1 08/26/2012 1440   RDW 16.3* 08/26/2012 1440   LYMPHSABS 3.9 06/09/2009 2152   MONOABS 0.7 06/09/2009 2152   EOSABS 0.2 06/09/2009 2152   BASOSABS 0.0 06/09/2009 2152    Hgb A1C Lab Results  Component Value Date   HGBA1C 7.8* 04/10/2012         Assessment & Plan:

## 2013-01-08 NOTE — Assessment & Plan Note (Signed)
Elevated today Continue current meds

## 2013-01-08 NOTE — Assessment & Plan Note (Signed)
Will recheck lipids today 

## 2013-01-08 NOTE — Patient Instructions (Signed)
Back Exercises  Back exercises help treat and prevent back injuries. The goal is to increase your strength in your belly (abdominal) and back muscles. These exercises can also help with flexibility. Start these exercises when told by your doctor.  HOME CARE  Back exercises include:  Pelvic Tilt.  · Lie on your back with your knees bent. Tilt your pelvis until the lower part of your back is against the floor. Hold this position 5 to 10 sec. Repeat this exercise 5 to 10 times.  Knee to Chest.  · Pull 1 knee up against your chest and hold for 20 to 30 seconds. Repeat this with the other knee. This may be done with the other leg straight or bent, whichever feels better. Then, pull both knees up against your chest.  Sit-Ups or Curl-Ups.  · Bend your knees 90 degrees. Start with tilting your pelvis, and do a partial, slow sit-up. Only lift your upper half 30 to 45 degrees off the floor. Take at least 2 to 3 seonds for each sit-up. Do not do sit-ups with your knees out straight. If partial sit-ups are difficult, simply do the above but with only tightening your belly (abdominal) muscles and holding it as told.  Hip-Lift.  · Lie on your back with your knees flexed 90 degrees. Push down with your feet and shoulders as you raise your hips 2 inches off the floor. Hold for 10 seconds, repeat 5 to 10 times.  Back Arches.  · Lie on your stomach. Prop yourself up on bent elbows. Slowly press on your hands, causing an arch in your low back. Repeat 3 to 5 times.  Shoulder-Lifts.  · Lie face down with arms beside your body. Keep hips and belly pressed to floor as you slowly lift your head and shoulders off the floor.  Do not overdo your exercises. Be careful in the beginning. Exercises may cause you some mild back discomfort. If the pain lasts for more than 15 minutes, stop the exercises until you see your doctor. Improvement with exercise for back problems is slow.   Document Released: 05/19/2010 Document Revised: 07/09/2011  Document Reviewed: 02/15/2011  [employer]® Patient Information ©2014 [employer].

## 2013-01-08 NOTE — Assessment & Plan Note (Signed)
Will switch advil back to naprosyn Consider PT, back films Work note provided

## 2013-01-08 NOTE — Assessment & Plan Note (Signed)
Will check A1C and microalbumin today Noncompliant with diet and exercise Will adjust medication as appropriate after labs are back

## 2013-01-09 ENCOUNTER — Telehealth: Payer: Self-pay

## 2013-01-09 LAB — VITAMIN D 25 HYDROXY (VIT D DEFICIENCY, FRACTURES): Vit D, 25-Hydroxy: 39 ng/mL (ref 30–89)

## 2013-01-09 MED ORDER — GLIPIZIDE 5 MG PO TABS: 10.0000 mg | ORAL_TABLET | Freq: Two times a day (BID) | ORAL | Status: DC

## 2013-01-09 NOTE — Telephone Encounter (Signed)
[facility]

## 2013-01-12 ENCOUNTER — Other Ambulatory Visit: Payer: Self-pay | Admitting: *Deleted

## 2013-01-12 DIAGNOSIS — E119 Type 2 diabetes mellitus without complications: Secondary | ICD-10-CM

## 2013-01-12 MED ORDER — GLIPIZIDE 5 MG PO TABS: 10.0000 mg | ORAL_TABLET | Freq: Two times a day (BID) | ORAL | Status: DC

## 2013-01-12 MED ORDER — NAPROXEN 500 MG PO TABS: 500.0000 mg | ORAL_TABLET | Freq: Two times a day (BID) | ORAL | Status: DC

## 2013-02-09 ENCOUNTER — Ambulatory Visit
Admission: RE | Admit: 2013-02-09 | Discharge: 2013-02-09 | Disposition: A | Discharge status: Home / Self Care | Payer: PRIVATE HEALTH INSURANCE | Source: Ambulatory Visit | Attending: Internal Medicine | Admitting: Internal Medicine

## 2013-02-09 DIAGNOSIS — Z1239 Encounter for other screening for malignant neoplasm of breast: Secondary | ICD-10-CM

## 2013-02-10 ENCOUNTER — Ambulatory Visit: Payer: BC Managed Care – PPO | Admitting: Internal Medicine

## 2013-02-27 ENCOUNTER — Ambulatory Visit (INDEPENDENT_AMBULATORY_CARE_PROVIDER_SITE_OTHER): Payer: BC Managed Care – PPO | Admitting: Internal Medicine

## 2013-02-27 ENCOUNTER — Encounter: Payer: Self-pay | Admitting: Internal Medicine

## 2013-02-27 VITALS — BP 150/90 | HR 74 | Temp 98.0°F | Wt 144.8 lb

## 2013-02-27 DIAGNOSIS — M545 Low back pain, unspecified: Secondary | ICD-10-CM

## 2013-02-27 DIAGNOSIS — J069 Acute upper respiratory infection, unspecified: Secondary | ICD-10-CM

## 2013-02-27 MED ORDER — CYCLOBENZAPRINE HCL 5 MG PO TABS: 5.0000 mg | ORAL_TABLET | Freq: Every evening | ORAL | Status: DC | PRN

## 2013-02-27 NOTE — Progress Notes (Signed)
Subjective:    Patient ID: Haley Mendez, female    DOB: 10-22-1955, 57 y.o.   MRN: [id number]  HPI  Pt presents to the clinic today with c/o back pain. This is a chronic issue for her. It seems to be work related. 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 She is taking Advil which helps. She does request a note for reduced lifting at work. Additionally, pt c/o productive cough with green sputum production and wheezing. This started about a month ago. She denies fever, chills or body aches. She has not taken anything OTC for this.. She has no history of allergies or asthma that she is aware of.  Review of Systems      Past Medical History  Diagnosis Date  . Arthritis   . Bone spur of other site     left rotator cuff  . Diabetes mellitus without complication   . Hypertension   . Hyperlipidemia   . History of colon polyps     Current Outpatient Prescriptions  Medication Sig Dispense Refill  . atorvastatin (LIPITOR) 20 MG tablet Take 1 tablet (20 mg total) by mouth daily.  30 tablet  5  . Blood Glucose Monitoring Suppl (ONE TOUCH ULTRA 2) W/DEVICE KIT Use to check blood sugars Dx 250.00  1 each  0  . ciclopirox (LOPROX) 0.77 % cream Apply topically 2 (two) times daily.  15 g  0  . glipiZIDE (GLUCOTROL) 5 MG tablet Take 2 tablets (10 mg total) by mouth 2 (two) times daily before a meal.  60 tablet  5  . glucose blood (ONE TOUCH TEST STRIPS) test strip 1 each by Other route daily. Use to check blood sugar daily Dx 250.00  100 each  3  . ibuprofen (ADVIL,MOTRIN) 800 MG tablet Take 1 tablet (800 mg total) by mouth every 8 (eight) hours as needed for pain.  30 tablet  0  . metFORMIN (GLUCOPHAGE) 1000 MG tablet Take 1 tablet (1,000 mg total) by mouth 2 (two) times daily with a meal.  180 tablet  2  . naproxen (NAPROSYN) 500 MG tablet Take 1 tablet (500 mg total) by mouth 2 (two) times daily with a meal.  60 tablet  2  . ONETOUCH DELICA LANCETS FINE MISC 1 each by Does not  apply route daily. Dx 250.00  100 each  3  . quinapril (ACCUPRIL) 40 MG tablet TAKE ONE TABLET BY MOUTH EVERY DAY  30 tablet  0   No current facility-administered medications for this visit.    No Known Allergies  Family History  Problem Relation Age of Onset  . Early death Mother   . Diabetes Father   . Hypertension Father   . Diabetes Brother   . Diabetes Brother     History   Social History  . Marital Status: Single    Spouse Name: N/A    Number of Children: 2  . 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 Smoking status: Current Every Day Smoker -- 0.50 packs/day for 15 years    Types: Cigarettes  . Smokeless tobacco: Never Used  . Alcohol Use: No  . Drug Use: No  . Sexual Activity: Not on file   Other Topics Concern  . Not on file   Social History Narrative  . No narrative on file     Constitutional: Denies fever, malaise, fatigue, headache or abrupt weight  changes.  HEENT: Denies eye pain, eye redness, ear pain, ringing in the ears, wax buildup, runny nose, nasal congestion, bloody nose, or sore throat. Respiratory: Denies difficulty breathing, shortness of breath.   Cardiovascular: Denies chest pain, chest tightness, palpitations or swelling in the hands or feet.  Musculoskeletal: Pt reports back pain. Denies decrease in range of motion, difficulty with gait, or joint pain and swelling.  Neurological: Denies dizziness, difficulty with memory, difficulty with speech or problems with balance and coordination.   No other specific complaints in a complete review of systems (except as listed in HPI above).  Objective:   Physical Exam   BP 150/90  Pulse 74  Temp(Src) 98 F (36.7 C) (Oral)  Wt 144 lb 12 oz (65.658 kg)  BMI 25.84 kg/m2  SpO2 98% Wt Readings from Last 3 Encounters:  02/27/13 144 lb 12 oz (65.658 kg)  01/08/13 144 lb 12.8 oz (65.681 kg)  08/26/12 144 lb (65.318 kg)    General: Appears  her stated age, well developed, well nourished in NAD. HEENT: Head: normal shape and size; Eyes: sclera white, no icterus, conjunctiva pink, PERRLA and EOMs intact; Ears: Tm's gray and intact, normal light reflex; Nose: mucosa pink and moist, septum midline; Throat/Mouth: Teeth present, mucosa pink and moist, no exudate, lesions or ulcerations noted.  Neck: Normal range of motion. Neck supple, trachea midline. No massses, lumps or thyromegaly present.  Cardiovascular: Normal rate and rhythm. S1,S2 noted.  No murmur, rubs or gallops noted. No JVD or BLE edema. No carotid bruits noted. Pulmonary/Chest: Normal effort and positive vesicular breath sounds. No respiratory distress. No wheezes, rales or ronchi noted.  Musculoskeletal: Normal range of motion. No signs of joint swelling. No difficulty with gait. Tender to palpation of the lower back muscles. Neurological: Alert and oriented. Cranial nerves II-XII intact. Coordination normal. +DTRs bilaterally. Psychiatric: Mood and affect normal. Behavior is normal. Judgment and thought content normal.     BMET    Component Value Date/Time   NA 143 01/08/2013 1436   K 4.2 01/08/2013 1436   CL 106 01/08/2013 1436   CO2 27 01/08/2013 1436   GLUCOSE 73 01/08/2013 1436   BUN 13 01/08/2013 1436   CREATININE 0.6 01/08/2013 1436   CALCIUM 9.7 01/08/2013 1436    Lipid Panel     Component Value Date/Time   CHOL 161 01/08/2013 1436   TRIG 101.0 01/08/2013 1436   HDL 43.00 01/08/2013 1436   CHOLHDL 4 01/08/2013 1436   VLDL 20.2 01/08/2013 1436   LDLCALC 98 01/08/2013 1436    CBC    Component Value Date/Time   WBC 8.7 01/08/2013 1436   RBC 4.89 01/08/2013 1436   HGB 13.1 01/08/2013 1436   HCT 40.8 01/08/2013 1436   PLT 264.0 01/08/2013 1436   MCV 83.4 01/08/2013 1436   MCHC 32.3 01/08/2013 1436   RDW 16.3* 01/08/2013 1436   LYMPHSABS 3.9 06/09/2009 2152   MONOABS 0.7 06/09/2009 2152   EOSABS 0.2 06/09/2009 2152   BASOSABS 0.0 06/09/2009 2152    Hgb A1C Lab  Results  Component Value Date   HGBA1C 7.7* 01/08/2013        Assessment & Plan:   Viral URI:  Seems to have resolved- exam normal Can take ibuprofen/Delsym if needed Continue to monitor for worsening symptoms/ fever and RTC if these develop

## 2013-02-27 NOTE — Patient Instructions (Signed)
Back Exercises These exercises may help you when beginning to rehabilitate your injury. Your symptoms may resolve with or without further involvement from your physician, physical therapist or athletic trainer. While completing these exercises, remember:   Restoring tissue flexibility helps normal motion to return to the joints. This allows healthier, less painful movement and activity.  An effective stretch should be held for at least 30 seconds.  A stretch should never be painful. You should only feel a gentle lengthening or release in the stretched tissue. STRETCH  Extension, Prone on Elbows   Lie on your stomach on the floor, a bed will be too soft. Place your palms about shoulder width apart and at the height of your head.  Place your elbows under your shoulders. If this is too painful, stack pillows under your chest.  Allow your body to relax so that your hips drop lower and make contact more completely with the floor.  Hold this position for __________ seconds.  Slowly return to lying flat on the floor. Repeat __________ times. Complete this exercise __________ times per day.  RANGE OF MOTION  Extension, Prone Press Ups   Lie on your stomach on the floor, a bed will be too soft. Place your palms about shoulder width apart and at the height of your head.  Keeping your back as relaxed as possible, slowly straighten your elbows while keeping your hips on the floor. You may adjust the placement of your hands to maximize your comfort. As you gain motion, your hands will come more underneath your shoulders.  Hold this position __________ seconds.  Slowly return to lying flat on the floor. Repeat __________ times. Complete this exercise __________ times per day.  RANGE OF MOTION- Quadruped, Neutral Spine   Assume a hands and knees position on a firm surface. Keep your hands under your shoulders and your knees under your hips. You may place padding under your knees for comfort.  Drop  your head and point your tail bone toward the ground below you. This will round out your low back like an angry cat. Hold this position for __________ seconds.  Slowly lift your head and release your tail bone so that your back sags into a large arch, like an old horse.  Hold this position for __________ seconds.  Repeat this until you feel limber in your low back.  Now, find your "sweet spot." This will be the most comfortable position somewhere between the two previous positions. This is your neutral spine. Once you have found this position, tense your stomach muscles to support your low back.  Hold this position for __________ seconds. Repeat __________ times. Complete this exercise __________ times per day.  STRETCH  Flexion, Single Knee to Chest   Lie on a firm bed or floor with both legs extended in front of you.  Keeping one leg in contact with the floor, bring your opposite knee to your chest. Hold your leg in place by either grabbing behind your thigh or at your knee.  Pull until you feel a gentle stretch in your low back. Hold __________ seconds.  Slowly release your grasp and repeat the exercise with the opposite side. Repeat __________ times. Complete this exercise __________ times per day.  STRETCH - Hamstrings, Standing  Stand or sit and extend your right / left leg, placing your foot on a chair or foot stool  Keeping a slight arch in your low back and your hips straight forward.  Lead with your chest and   lean forward at the waist until you feel a gentle stretch in the back of your right / left knee or thigh. (When done correctly, this exercise requires leaning only a small distance.)  Hold this position for __________ seconds. Repeat __________ times. Complete this stretch __________ times per day. STRENGTHENING  Deep Abdominals, Pelvic Tilt   Lie on a firm bed or floor. Keeping your legs in front of you, bend your knees so they are both pointed toward the ceiling and  your feet are flat on the floor.  Tense your lower abdominal muscles to press your low back into the floor. This motion will rotate your pelvis so that your tail bone is scooping upwards rather than pointing at your feet or into the floor.  With a gentle tension and even breathing, hold this position for __________ seconds. Repeat __________ times. Complete this exercise __________ times per day.  STRENGTHENING  Abdominals, Crunches   Lie on a firm bed or floor. Keeping your legs in front of you, bend your knees so they are both pointed toward the ceiling and your feet are flat on the floor. Cross your arms over your chest.  Slightly tip your chin down without bending your neck.  Tense your abdominals and slowly lift your trunk high enough to just clear your shoulder blades. Lifting higher can put excessive stress on the low back and does not further strengthen your abdominal muscles.  Control your return to the starting position. Repeat __________ times. Complete this exercise __________ times per day.  STRENGTHENING  Quadruped, Opposite UE/LE Lift   Assume a hands and knees position on a firm surface. Keep your hands under your shoulders and your knees under your hips. You may place padding under your knees for comfort.  Find your neutral spine and gently tense your abdominal muscles so that you can maintain this position. Your shoulders and hips should form a rectangle that is parallel with the floor and is not twisted.  Keeping your trunk steady, lift your right hand no higher than your shoulder and then your left leg no higher than your hip. Make sure you are not holding your breath. Hold this position __________ seconds.  Continuing to keep your abdominal muscles tense and your back steady, slowly return to your starting position. Repeat with the opposite arm and leg. Repeat __________ times. Complete this exercise __________ times per day. Document Released: 05/04/2005 Document  Revised: 07/09/2011 Document Reviewed: 07/29/2008 [employer] Patient Information 2014 [employer].  

## 2013-02-27 NOTE — Assessment & Plan Note (Signed)
Will add flexeril at night Continue advil during the day Work note provided for reduced work load

## 2013-03-04 ENCOUNTER — Ambulatory Visit (INDEPENDENT_AMBULATORY_CARE_PROVIDER_SITE_OTHER): Payer: BC Managed Care – PPO | Admitting: Internal Medicine

## 2013-03-04 ENCOUNTER — Encounter: Payer: Self-pay | Admitting: Internal Medicine

## 2013-03-04 ENCOUNTER — Telehealth: Payer: Self-pay | Admitting: Internal Medicine

## 2013-03-04 VITALS — BP 144/70 | HR 72 | Temp 99.5°F | Wt 145.8 lb

## 2013-03-04 DIAGNOSIS — J069 Acute upper respiratory infection, unspecified: Secondary | ICD-10-CM

## 2013-03-04 MED ORDER — SULFAMETHOXAZOLE-TMP DS 800-160 MG PO TABS: 1.0000 | ORAL_TABLET | Freq: Two times a day (BID) | ORAL | Status: DC

## 2013-03-04 MED ORDER — PROMETHAZINE-CODEINE 6.25-10 MG/5ML PO SYRP: 5.0000 mL | ORAL_SOLUTION | ORAL | Status: DC | PRN

## 2013-03-04 NOTE — Telephone Encounter (Signed)
Patient Information:  Caller Name: Haley Mendez  Phone: [phone]  Patient: Haley Mendez  Gender: Female  DOB: 1955-12-08  Age: 57 Years  PCP: Nicki Reaper  Office Follow Up:  Does the office need to follow up with this patient?: No  Instructions For The Office: N/A  RN Note:  Haley Mendez was in the office on 02/27/13 d/t wheezing.  States Monday, her cough worsened and is continuing to worsen.  Denies difficulty breathing.  Continues to have intermittent wheezing.  Worsens at night.  Symptoms  Reason For Call & Symptoms: Cough/congestion  Reviewed Health History In EMR: Yes  Reviewed Medications In EMR: Yes  Reviewed Allergies In EMR: Yes  Reviewed Surgeries / Procedures: Yes  Date of Onset of Symptoms: 03/02/2013  Treatments Tried: Tylenol Cold  Treatments Tried Worked: No  Guideline(s) Used:  Cough  Disposition Per Guideline:   Go to Office Now  Reason For Disposition Reached:   Wheezing is present  Advice Given:  Call Back If:  You become worse.  Patient Will Follow Care Advice:  YES  Appointment Scheduled:  03/04/2013 16:15:00 Appointment Scheduled Provider:  Illene Regulus (Adults only)

## 2013-03-04 NOTE — Progress Notes (Signed)
Pre-visit discussion using our clinic review tool. No additional management support is needed unless otherwise documented below in the visit note.  

## 2013-03-04 NOTE — Telephone Encounter (Signed)
Patient Information:  Caller Name: Ivanna  Phone: [phone]  Patient: Haley Mendez  Gender: Female  DOB: Mar 03, 1956  Age: 57 Years  PCP: Nicki Reaper  Office Follow Up:  Does the office need to follow up with this patient?: Yes  Instructions For The Office: Call back needed with provider recommendation for high risk with influenza symptoms.  RN Note:  FBS not tested; "its been a while" since tested.  Abdominal soreness from hard coughing. Seen 02/27/13 for wheezing and coughing for one month "but she (Ms Praesel, NP) did not do anything. I might have to change doctors."  Does not meet criterial for [place].  She thinks she may need an antibiotic. [facility].  Symptoms  Reason For Call & Symptoms: Suspected influenza with wheezing, rhinorrhea, productive cough, headache, body aches and "scratchy" sore throat.  Reviewed Health History In EMR: Yes  Reviewed Medications In EMR: Yes  Reviewed Allergies In EMR: Yes  Reviewed Surgeries / Procedures: Yes  Date of Onset of Symptoms: 03/03/2013  Treatments Tried: Tylenol cold  Treatments Tried Worked: No  Guideline(s) Used:  Influenza - Seasonal  Disposition Per Guideline:   Discuss with PCP and Callback by Nurse within 1 Hour  Reason For Disposition Reached:   HIGH RISK (e.g., age > 64 years, pregnant, HIV+, chronic medical condition) and flu symptoms  Advice Given:  N/A  Patient Will Follow Care Advice:  YES

## 2013-03-04 NOTE — Progress Notes (Signed)
Subjective:    Patient ID: Haley Mendez, female    DOB: 03-12-1956, 57 y.o.   MRN: [id number]  HPI Ms. Armor presents for a 5 day h/o cough but several month h/o wheezing. She did have bronchitis. No SOB, no h/o asthma. No fever. Sputum is clear. No chest pain. She has soreness in her stomach with coughing.  Past Medical History  Diagnosis Date  . Arthritis   . Bone spur of other site     left rotator cuff  . Diabetes mellitus without complication   . Hypertension   . Hyperlipidemia   . History of colon polyps    History reviewed. No pertinent past surgical history. Family History  Problem Relation Age of Onset  . Early death Mother   . Diabetes Father   . Hypertension Father   . Diabetes Brother   . Diabetes Brother    History   Social History  . Marital Status: Single    Spouse Name: N/A    Number of Children: 2  . 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 Smoking status: Current Every Day Smoker -- 0.50 packs/day for 15 years    Types: Cigarettes  . Smokeless tobacco: Never Used  . Alcohol Use: No  . Drug Use: No  . Sexual Activity: Not on file   Other Topics Concern  . Not on file   Social History Narrative  . No narrative on file    Current Outpatient Prescriptions on File Prior to Visit  Medication Sig Dispense Refill  . atorvastatin (LIPITOR) 20 MG tablet Take 1 tablet (20 mg total) by mouth daily.  30 tablet  5  . glipiZIDE (GLUCOTROL) 5 MG tablet Take 2 tablets (10 mg total) by mouth 2 (two) times daily before a meal.  60 tablet  5  . ibuprofen (ADVIL,MOTRIN) 800 MG tablet Take 1 tablet (800 mg total) by mouth every 8 (eight) hours as needed for pain.  30 tablet  0  . metFORMIN (GLUCOPHAGE) 1000 MG tablet Take 1 tablet (1,000 mg total) by mouth 2 (two) times daily with a meal.  180 tablet  2  . naproxen (NAPROSYN) 500 MG tablet Take 1 tablet (500 mg total) by mouth 2 (two) times daily with a meal.   60 tablet  2  . quinapril (ACCUPRIL) 40 MG tablet TAKE ONE TABLET BY MOUTH EVERY DAY  30 tablet  0  . Blood Glucose Monitoring Suppl (ONE TOUCH ULTRA 2) W/DEVICE KIT Use to check blood sugars Dx 250.00  1 each  0  . glucose blood (ONE TOUCH TEST STRIPS) test strip 1 each by Other route daily. Use to check blood sugar daily Dx 250.00  100 each  3  . ONETOUCH DELICA LANCETS FINE MISC 1 each by Does not apply route daily. Dx 250.00  100 each  3   No current facility-administered medications on file prior to visit.      Review of Systems System review is negative for any constitutional, cardiac, pulmonary, GI or neuro symptoms or complaints other than as described in the HPI.     Objective:   Physical Exam Filed Vitals:   03/04/13 1637  BP: 144/70  Pulse: 72  Temp: 99.5 F (37.5 C)   Gen'l - WNWD woman in no acute distress HEENT- TMs clear, no sinus tenderness Neck - supple Nodes - negative Cor- RRR Pulm - no rales, wheezes or rhonchi Abd-  generalized tenderness.        Assessment & Plan:  URI -   Plan Septra DS twice a day for 7 days  Phenergan/codiene syrup 1 tsp every 4 hours for cough  Tylenol 500 mg every 4 hours for aches, pains, fevers  Hydrate with fluid of choice  Mucinex 600 mg twice a day  Generic claritin once a day  Return to work on Monday.

## 2013-03-04 NOTE — Patient Instructions (Signed)
URI -   Plan Septra DS twice a day for 7 days  Phenergan/codiene syrup 1 tsp every 4 hours for cough  Tylenol 500 mg every 4 hours for aches, pains, fevers  Hydrate with fluid of choice  Mucinex 600 mg twice a day  Generic claritin once a day  Return to work on Monday.   Upper Respiratory Infection, Adult An upper respiratory infection (URI) is also known as the common cold. It is often caused by a type of germ (virus). Colds are easily spread (contagious). You can pass it to others by kissing, coughing, sneezing, or drinking out of the same glass. Usually, you get better in 1 or 2 weeks.  HOME CARE   Only take medicine as told by your doctor.  Use a warm mist humidifier or breathe in steam from a hot shower.  Drink enough water and fluids to keep your pee (urine) clear or pale yellow.  Get plenty of rest.  Return to work when your temperature is back to normal or as told by your doctor. You may use a face mask and wash your hands to stop your cold from spreading. GET HELP RIGHT AWAY IF:   After the first few days, you feel you are getting worse.  You have questions about your medicine.  You have chills, shortness of breath, or brown or red spit (mucus).  You have yellow or brown snot (nasal discharge) or pain in the face, especially when you bend forward.  You have a fever, puffy (swollen) neck, pain when you swallow, or white spots in the back of your throat.  You have a bad headache, ear pain, sinus pain, or chest pain.  You have a high-pitched whistling sound when you breathe in and out (wheezing).  You have a lasting cough or cough up blood.  You have sore muscles or a stiff neck. MAKE SURE YOU:   Understand these instructions.  Will watch your condition.  Will get help right away if you are not doing well or get worse. Document Released: 10/03/2007 Document Revised: 07/09/2011 Document Reviewed: 08/21/2010 [facility] Patient Information 2014 [place], [place].

## 2013-03-14 ENCOUNTER — Other Ambulatory Visit: Payer: Self-pay | Admitting: Internal Medicine

## 2013-04-01 ENCOUNTER — Telehealth: Payer: Self-pay | Admitting: Internal Medicine

## 2013-04-01 NOTE — Telephone Encounter (Signed)
Pt req referral for OBGYN. Please advise.

## 2013-04-05 NOTE — Telephone Encounter (Signed)
She doesn't need a referral. She just needs to call and schedule an appt with the gyn of her choice

## 2013-04-07 ENCOUNTER — Telehealth: Payer: Self-pay | Admitting: *Deleted

## 2013-04-07 NOTE — Telephone Encounter (Signed)
Patient does not have insurance anymore.  She is requesting cheaper alternatives. To replace Lipitor:   Lovastatin is $4 for #30 and Simvastatin is $20 for #30 To replace Quinapril:  Lisinopril (5 mg, 10 mg or 20 mg) is $4 for 30 tabs, (30 mg and 40 mg tabs) are $14 for #30. Please advise.  Hard copy forms are on your desk if you need them.

## 2013-04-08 NOTE — Telephone Encounter (Signed)
Ok to switch to lovastatin 20 mg daily QHS # 30 2 refills and lisinopril 20 mg daily # 3) 2 refills

## 2013-04-09 ENCOUNTER — Telehealth: Payer: Self-pay

## 2013-04-09 MED ORDER — LISINOPRIL 20 MG PO TABS: 20.0000 mg | ORAL_TABLET | Freq: Every day | ORAL | Status: DC

## 2013-04-09 MED ORDER — LOVASTATIN 20 MG PO TABS: 20.0000 mg | ORAL_TABLET | Freq: Every day | ORAL | Status: DC

## 2013-04-09 NOTE — Telephone Encounter (Signed)
Alternate Rx e scribed to pharmacy--will notify pt

## 2013-04-09 NOTE — Addendum Note (Signed)
Addended by: Roena Malady on: 04/09/2013 04:47 PM   Modules accepted: Orders

## 2013-04-09 NOTE — Telephone Encounter (Signed)
Left message on voicemail for pt to return call  

## 2013-04-10 ENCOUNTER — Telehealth: Payer: Self-pay

## 2013-04-10 DIAGNOSIS — E119 Type 2 diabetes mellitus without complications: Secondary | ICD-10-CM

## 2013-04-10 MED ORDER — GLIPIZIDE 10 MG PO TABS: 10.0000 mg | ORAL_TABLET | Freq: Two times a day (BID) | ORAL | Status: DC

## 2013-04-10 NOTE — Telephone Encounter (Signed)
Rx change and sent through e-scribe  pt is aware

## 2013-04-10 NOTE — Addendum Note (Signed)
Addended by: Roena Malady on: 04/10/2013 04:36 PM   Modules accepted: Orders

## 2013-04-10 NOTE — Telephone Encounter (Signed)
Pt wants to know why she needs to have 2- 5mg  tabs of Glipizide BID instead of 10mg  tab BID-- pt states she is only taking 2 tabs at bedtime. Pt states it is too expensive and too many pills--please advise-

## 2013-04-10 NOTE — Addendum Note (Signed)
Addended by: Roena Malady on: 04/10/2013 04:50 PM   Modules accepted: Orders

## 2013-04-10 NOTE — Telephone Encounter (Signed)
Ok to switch to the 10 mg tabs

## 2013-04-14 ENCOUNTER — Telehealth: Payer: Self-pay | Admitting: *Deleted

## 2013-04-14 NOTE — Telephone Encounter (Signed)
Recommendation appropriate

## 2013-04-14 NOTE — Telephone Encounter (Signed)
Triage Record Num: [id number] Operator: Tana Felts Patient Name: Haley Mendez Call Date & Time: 04/13/2013 7:51:18PM Patient Phone: [phone] PCP: Nicki Reaper Patient Gender: Female PCP Fax : Patient DOB: 1955-10-12 Practice Name: [facility] Reason for Call: Caller: [name]/Patient; PCP: Nicki Reaper; CB#: [phone]; Onset: 04/10/13; Afebrile Call regarding moderate intermittent upper abdominal pain - constant at times. Pain usually is brought on after she eats. Last bowel movement 04/13/13. Triaged Abdominal Pain Protocol. + for repeated episodes of abdominal discomfort and no previous evaluation by provider. Disposition: See provider within 2 weeks. Care advice: per guidelines, recommended taking antiacid and if symptoms persist to follow up with office to get an appt on 04/14/13. No futher needs. Protocol(s) Used: Abdominal Pain Recommended Outcome per Protocol: See Provider within 2 Weeks Reason for Outcome: Repeated episodes of abdominal discomfort AND no previous evaluation by provider Care Advice: Avoid foods that may be responsible for increased gas production (such as dairy products, raw vegetables, nuts, spicy or fried foods, etc.). ~ ~ Call provider if symptoms worsen or new symptoms develop. ~ Call provider immediately if develop severe pain, nausea, vomiting, abdominal swelling or fever. Consider nonprescription low-sodium antacids (i.e. Mylanta, Maalox, Tums, Gelusil), H-2-receptor blockers (Tagamet HB, Pepcid AC, Zantac, Axid), or a proton pump inhibitor (Prilosec) after discussing with your provider to determine would be safest for you and your situation. ~ 12/

## 2013-04-27 ENCOUNTER — Other Ambulatory Visit: Payer: Self-pay

## 2013-04-27 DIAGNOSIS — B351 Tinea unguium: Secondary | ICD-10-CM

## 2013-04-27 NOTE — Telephone Encounter (Signed)
Pt left v/m requesting refill ciclopirox for her toes to [facility]. Pt request cb.

## 2013-04-28 MED ORDER — CICLOPIROX OLAMINE 0.77 % EX CREA: TOPICAL_CREAM | Freq: Two times a day (BID) | CUTANEOUS | Status: DC

## 2013-05-14 ENCOUNTER — Ambulatory Visit: Payer: BC Managed Care – PPO | Admitting: Physician Assistant

## 2013-07-03 ENCOUNTER — Telehealth: Payer: Self-pay | Admitting: *Deleted

## 2013-07-03 NOTE — Telephone Encounter (Signed)
Patient phoned requesting refills but did not leave specifics.  Returned patient's call, not home, left message with family member.

## 2013-07-06 ENCOUNTER — Other Ambulatory Visit: Payer: Self-pay | Admitting: *Deleted

## 2013-07-06 ENCOUNTER — Other Ambulatory Visit: Payer: Self-pay

## 2013-07-06 DIAGNOSIS — B351 Tinea unguium: Secondary | ICD-10-CM

## 2013-07-06 MED ORDER — LISINOPRIL 20 MG PO TABS: 20.0000 mg | ORAL_TABLET | Freq: Every day | ORAL | Status: DC

## 2013-07-06 MED ORDER — METFORMIN HCL 1000 MG PO TABS: 1000.0000 mg | ORAL_TABLET | Freq: Two times a day (BID) | ORAL | Status: DC

## 2013-07-06 MED ORDER — CICLOPIROX OLAMINE 0.77 % EX CREA: TOPICAL_CREAM | Freq: Two times a day (BID) | CUTANEOUS | Status: DC

## 2013-07-06 MED ORDER — LOVASTATIN 20 MG PO TABS: 20.0000 mg | ORAL_TABLET | Freq: Every day | ORAL | Status: DC

## 2013-07-06 NOTE — Addendum Note (Signed)
Addended by: Daralene Milch C on: 07/06/2013 12:00 PM   Modules accepted: Orders

## 2013-07-06 NOTE — Telephone Encounter (Signed)
Patient phoned again this morning, requesting refills on metformin, lisinopril, and lovastatin. Last OV with PCP 03/04/13.  Refilled metformin and lovastatin per protocol, lisinopril appeared to already been re-ordered.  Notified patient.

## 2013-07-06 NOTE — Telephone Encounter (Signed)
Last filled 04/28/13

## 2013-07-13 ENCOUNTER — Ambulatory Visit: Payer: No Typology Code available for payment source | Attending: Internal Medicine | Admitting: Internal Medicine

## 2013-07-13 ENCOUNTER — Encounter: Payer: Self-pay | Admitting: Internal Medicine

## 2013-07-13 VITALS — BP 165/90 | HR 77 | Temp 98.9°F | Resp 16 | Ht 62.0 in | Wt 148.0 lb

## 2013-07-13 DIAGNOSIS — Z860100 Personal history of colon polyps, unspecified: Secondary | ICD-10-CM | POA: Insufficient documentation

## 2013-07-13 DIAGNOSIS — I10 Essential (primary) hypertension: Secondary | ICD-10-CM | POA: Insufficient documentation

## 2013-07-13 DIAGNOSIS — F17200 Nicotine dependence, unspecified, uncomplicated: Secondary | ICD-10-CM | POA: Insufficient documentation

## 2013-07-13 DIAGNOSIS — E119 Type 2 diabetes mellitus without complications: Secondary | ICD-10-CM

## 2013-07-13 DIAGNOSIS — B351 Tinea unguium: Secondary | ICD-10-CM

## 2013-07-13 DIAGNOSIS — E785 Hyperlipidemia, unspecified: Secondary | ICD-10-CM | POA: Insufficient documentation

## 2013-07-13 DIAGNOSIS — Z8249 Family history of ischemic heart disease and other diseases of the circulatory system: Secondary | ICD-10-CM | POA: Insufficient documentation

## 2013-07-13 DIAGNOSIS — Z008 Encounter for other general examination: Secondary | ICD-10-CM | POA: Insufficient documentation

## 2013-07-13 DIAGNOSIS — Z79899 Other long term (current) drug therapy: Secondary | ICD-10-CM | POA: Insufficient documentation

## 2013-07-13 DIAGNOSIS — Z833 Family history of diabetes mellitus: Secondary | ICD-10-CM | POA: Insufficient documentation

## 2013-07-13 LAB — CBC WITH DIFFERENTIAL/PLATELET
BASOS ABS: 0.1 10*3/uL (ref 0.0–0.1)
Basophils Relative: 1 % (ref 0–1)
EOS PCT: 3 % (ref 0–5)
Eosinophils Absolute: 0.3 10*3/uL (ref 0.0–0.7)
HCT: 36.7 % (ref 36.0–46.0)
Hemoglobin: 12.2 g/dL (ref 12.0–15.0)
LYMPHS PCT: 42 % (ref 12–46)
Lymphs Abs: 3.9 10*3/uL (ref 0.7–4.0)
MCH: 26.6 pg (ref 26.0–34.0)
MCHC: 33.2 g/dL (ref 30.0–36.0)
MCV: 80.1 fL (ref 78.0–100.0)
Monocytes Absolute: 0.8 10*3/uL (ref 0.1–1.0)
Monocytes Relative: 8 % (ref 3–12)
Neutro Abs: 4.3 10*3/uL (ref 1.7–7.7)
Neutrophils Relative %: 46 % (ref 43–77)
PLATELETS: 290 10*3/uL (ref 150–400)
RBC: 4.58 MIL/uL (ref 3.87–5.11)
RDW: 17 % — AB (ref 11.5–15.5)
WBC: 9.4 10*3/uL (ref 4.0–10.5)

## 2013-07-13 LAB — POCT GLYCOSYLATED HEMOGLOBIN (HGB A1C): HEMOGLOBIN A1C: 6.5

## 2013-07-13 LAB — GLUCOSE, POCT (MANUAL RESULT ENTRY)
POC Glucose: 64 mg/dl — AB (ref 70–99)
POC Glucose: 106 mg/dl — AB (ref 70–99)

## 2013-07-13 MED ORDER — GLIPIZIDE 10 MG PO TABS: 10.0000 mg | ORAL_TABLET | Freq: Two times a day (BID) | ORAL | Status: DC

## 2013-07-13 MED ORDER — LISINOPRIL 20 MG PO TABS: 20.0000 mg | ORAL_TABLET | Freq: Every day | ORAL | Status: DC

## 2013-07-13 MED ORDER — SIMVASTATIN 40 MG PO TABS: 40.0000 mg | ORAL_TABLET | Freq: Every day | ORAL | Status: DC

## 2013-07-13 MED ORDER — GLUCOSE BLOOD VI STRP: ORAL_STRIP | Status: DC

## 2013-07-13 MED ORDER — ATORVASTATIN CALCIUM 20 MG PO TABS: 20.0000 mg | ORAL_TABLET | Freq: Every day | ORAL | Status: DC

## 2013-07-13 MED ORDER — METFORMIN HCL 1000 MG PO TABS: 500.0000 mg | ORAL_TABLET | Freq: Two times a day (BID) | ORAL | Status: DC

## 2013-07-13 MED ORDER — HYDROCHLOROTHIAZIDE 25 MG PO TABS: 25.0000 mg | ORAL_TABLET | Freq: Every day | ORAL | Status: DC

## 2013-07-13 MED ORDER — FREESTYLE SYSTEM KIT: 1.0000 | PACK | Status: DC | PRN

## 2013-07-13 MED ORDER — LOVASTATIN 20 MG PO TABS: 20.0000 mg | ORAL_TABLET | Freq: Every day | ORAL | Status: DC

## 2013-07-13 MED ORDER — CICLOPIROX OLAMINE 0.77 % EX CREA: TOPICAL_CREAM | Freq: Two times a day (BID) | CUTANEOUS | Status: DC

## 2013-07-13 NOTE — Progress Notes (Signed)
Patient ID: Haley Mendez, female   DOB: 02/23/1956, 58 y.o.   MRN: [id number]   CC:  HPI: Patient here to establish care. She is 58 years old primarily has hypertension diabetes dyslipidemia, she smokes half a pack a day. Currently unemployed lost her insurance and is in the process of changing providers. No significant complaints except for the fact that she was found to have a CBG of 64. Crackers  and candy provided  No chest pain or shortness of breath no symptoms of peripheral neuropathy Had a colonoscopy in 2010, Had a Pap smear mammogram October 2014 Had the flu after receiving flu shot in 2014, and decided never to take the flu shot again   Social history smokes half a pack a day  Family history positive for hypertension and diabetes  Stress test 6/11  1. There is no evidence for exercise-induced myocardial ischemia.  2. Left ventricular ejection fraction equals 48%.  3. Apical, lateral and inferior wall hypokinesis.  No Known Allergies Past Medical History  Diagnosis Date  . Arthritis   . Bone spur of other site     left rotator cuff  . Diabetes mellitus without complication   . Hypertension   . Hyperlipidemia   . History of colon polyps    Current Outpatient Prescriptions on File Prior to Visit  Medication Sig Dispense Refill  . naproxen (NAPROSYN) 500 MG tablet Take 1 tablet (500 mg total) by mouth 2 (two) times daily with a meal.  60 tablet  2  . Blood Glucose Monitoring Suppl (ONE TOUCH ULTRA 2) W/DEVICE KIT Use to check blood sugars Dx 250.00  1 each  0  . glucose blood (ONE TOUCH TEST STRIPS) test strip 1 each by Other route daily. Use to check blood sugar daily Dx 250.00  100 each  3  . ONETOUCH DELICA LANCETS FINE MISC 1 each by Does not apply route daily. Dx 250.00  100 each  3  . promethazine-codeine (PHENERGAN WITH CODEINE) 6.25-10 MG/5ML syrup Take 5 mLs by mouth every 4 (four) hours as needed for cough.  180 mL  0  . quinapril (ACCUPRIL) 40 MG tablet  TAKE ONE TABLET BY MOUTH EVERY DAY  30 tablet  0  . sulfamethoxazole-trimethoprim (BACTRIM DS) 800-160 MG per tablet Take 1 tablet by mouth 2 (two) times daily.  14 tablet  0   No current facility-administered medications on file prior to visit.   Family History  Problem Relation Age of Onset  . Early death Mother   . Diabetes Father   . Hypertension Father   . Diabetes Brother   . Diabetes Brother    History   Social History  . Marital Status: Single    Spouse Name: N/A    Number of Children: 2  . 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 Smoking status: Current Every Day Smoker -- 0.50 packs/day for 15 years    Types: Cigarettes  . Smokeless tobacco: Never Used  . Alcohol Use: No  . Drug Use: No  . Sexual Activity: Not on file   Other Topics Concern  . Not on file   Social History Narrative  . No narrative on file    Review of Systems  Constitutional: Negative for fever, chills, diaphoresis, activity change, appetite change and fatigue.  HENT: Negative for ear pain, nosebleeds, congestion, facial swelling, rhinorrhea, neck pain, neck stiffness and ear discharge.   Eyes:  Negative for pain, discharge, redness, itching and visual disturbance.  Respiratory: Negative for cough, choking, chest tightness, shortness of breath, wheezing and stridor.   Cardiovascular: Negative for chest pain, palpitations and leg swelling.  Gastrointestinal: Negative for abdominal distention.  Genitourinary: Negative for dysuria, urgency, frequency, hematuria, flank pain, decreased urine volume, difficulty urinating and dyspareunia.  Musculoskeletal: Negative for back pain, joint swelling, arthralgias and gait problem.  Neurological: Negative for dizziness, tremors, seizures, syncope, facial asymmetry, speech difficulty, weakness, light-headedness, numbness and headaches.  Hematological: Negative for adenopathy. Does not bruise/bleed easily.   Psychiatric/Behavioral: Negative for hallucinations, behavioral problems, confusion, dysphoric mood, decreased concentration and agitation.    Objective:   Filed Vitals:   07/13/13 1512  BP: 165/90  Pulse: 77  Temp: 98.9 F (37.2 C)  Resp: 16    Physical Exam  Constitutional: Appears well-developed and well-nourished. No distress.  HENT: Normocephalic. External right and left ear normal. Oropharynx is clear and moist.  Eyes: Conjunctivae and EOM are normal. PERRLA, no scleral icterus.  Neck: Normal ROM. Neck supple. No JVD. No tracheal deviation. No thyromegaly.  CVS: RRR, S1/S2 +, no murmurs, no gallops, no carotid bruit.  Pulmonary: Effort and breath sounds normal, no stridor, rhonchi, wheezes, rales.  Abdominal: Soft. BS +,  no distension, tenderness, rebound or guarding.  Musculoskeletal: Normal range of motion. No edema and no tenderness.  Lymphadenopathy: No lymphadenopathy noted, cervical, inguinal. Neuro: Alert. Normal reflexes, muscle tone coordination. No cranial nerve deficit. Skin: Skin is warm and dry. No rash noted. Not diaphoretic. No erythema. No pallor.  Psychiatric: Normal mood and affect. Behavior, judgment, thought content normal.   Lab Results  Component Value Date   WBC 8.7 01/08/2013   HGB 13.1 01/08/2013   HCT 40.8 01/08/2013   MCV 83.4 01/08/2013   PLT 264.0 01/08/2013   Lab Results  Component Value Date   CREATININE 0.6 01/08/2013   BUN 13 01/08/2013   NA 143 01/08/2013   K 4.2 01/08/2013   CL 106 01/08/2013   CO2 27 01/08/2013    Lab Results  Component Value Date   HGBA1C 6.5 07/13/2013   Lipid Panel     Component Value Date/Time   CHOL 161 01/08/2013 1436   TRIG 101.0 01/08/2013 1436   HDL 43.00 01/08/2013 1436   CHOLHDL 4 01/08/2013 1436   VLDL 20.2 01/08/2013 1436   LDLCALC 98 01/08/2013 1436       Assessment and plan:   Patient Active Problem List   Diagnosis Date Noted  . Hypertension 04/10/2012  . Toenail fungus 04/10/2012  .  Hyperlipidemia 04/10/2012  . TOBACCO ABUSE 12/30/2009  . LUMBAGO 12/30/2009  . MENOPAUSE-RELATED VASOMOTOR SYMPTOMS, HOT FLASHES 07/07/2009  . DIABETES MELLITUS, TYPE II 06/09/2009  . GERD 06/09/2009  . OSTEOARTHRITIS 06/09/2009       Hypertension, uncontrolled Continue lisinopril at 20 mg Blood pressure uncontrolled Will add hydrochlorothiazide 25 mg   Diabetes, type II A1c of 6.5 CBG 64, will provide a glucometer glucose strips Given episodes of hypoglycemia, decrease metformin to 500 twice a day   Dyslipidemia Discontinue lovastatin, switch to simvastatin  Established area Pap smear/mammogram October 2014 Flu shot October 2014 Refusing to take flu shots in the future States she received tetanus immunization within the last 10 years Colonoscopy 2010   The patient was given clear instructions to go to ER or return to medical center if symptoms don't improve, worsen or new problems develop. The patient verbalized understanding. The patient was told to call  to get any lab results if not heard anything in the next week.

## 2013-07-13 NOTE — Progress Notes (Signed)
Pt is here to establish care for diabetes and HTN Taking medications prescribed by ER visit Pt needs Lovastatin changes to Simvastatin CBG- 64 crackers and candy given Aox3 Need glucose meter Refused flu vaccine

## 2013-07-14 ENCOUNTER — Telehealth: Payer: Self-pay | Admitting: Emergency Medicine

## 2013-07-14 LAB — COMPLETE METABOLIC PANEL WITH GFR
ALT: 14 U/L (ref 0–35)
AST: 18 U/L (ref 0–37)
Albumin: 4.1 g/dL (ref 3.5–5.2)
Alkaline Phosphatase: 101 U/L (ref 39–117)
BILIRUBIN TOTAL: 0.3 mg/dL (ref 0.2–1.2)
BUN: 11 mg/dL (ref 6–23)
CO2: 18 meq/L — AB (ref 19–32)
Calcium: 9.5 mg/dL (ref 8.4–10.5)
Chloride: 109 mEq/L (ref 96–112)
Creat: 0.66 mg/dL (ref 0.50–1.10)
GLUCOSE: 62 mg/dL — AB (ref 70–99)
POTASSIUM: 4.3 meq/L (ref 3.5–5.3)
Sodium: 139 mEq/L (ref 135–145)
TOTAL PROTEIN: 6.7 g/dL (ref 6.0–8.3)

## 2013-07-14 LAB — LIPID PANEL
Cholesterol: 153 mg/dL (ref 0–200)
HDL: 39 mg/dL — AB (ref >39)
LDL Cholesterol: 95 mg/dL (ref 0–99)
Total CHOL/HDL Ratio: 3.9 Ratio
Triglycerides: 96 mg/dL (ref <150)
VLDL: 19 mg/dL (ref 0–40)

## 2013-07-14 LAB — VITAMIN D 25 HYDROXY (VIT D DEFICIENCY, FRACTURES): Vit D, 25-Hydroxy: 52 ng/mL (ref 30–89)

## 2013-07-14 LAB — TSH: TSH: 1.292 u[IU]/mL (ref 0.350–4.500)

## 2013-07-14 MED ORDER — METFORMIN HCL 1000 MG PO TABS: 500.0000 mg | ORAL_TABLET | Freq: Two times a day (BID) | ORAL | Status: DC

## 2013-07-14 NOTE — Telephone Encounter (Signed)
Left message for pt to call clinic for results 

## 2013-07-14 NOTE — Addendum Note (Signed)
Addended by: Allyson Sabal MD, [facility] on: 07/14/2013 01:35 PM   Modules accepted: Orders

## 2013-07-14 NOTE — Telephone Encounter (Signed)
Message copied by Ricci Barker on Tue Jul 14, 2013  2:55 PM ------      Message from: Allyson Sabal MD, [facility]      Created: Tue Jul 14, 2013  1:35 PM       Notify patient of the labs are normal ------

## 2013-08-07 ENCOUNTER — Telehealth: Payer: Self-pay | Admitting: Internal Medicine

## 2013-10-13 ENCOUNTER — Ambulatory Visit: Payer: No Typology Code available for payment source | Admitting: Internal Medicine

## 2013-11-12 ENCOUNTER — Telehealth: Payer: Self-pay

## 2013-11-12 NOTE — Telephone Encounter (Signed)
LVM for pt to call and set up a new PCP

## 2013-12-11 ENCOUNTER — Ambulatory Visit: Payer: Self-pay | Admitting: Family Medicine

## 2014-01-11 ENCOUNTER — Encounter: Payer: Self-pay | Admitting: Internal Medicine

## 2014-01-11 ENCOUNTER — Ambulatory Visit: Payer: No Typology Code available for payment source | Attending: Internal Medicine | Admitting: Internal Medicine

## 2014-01-11 VITALS — BP 120/76 | HR 60 | Temp 99.0°F | Resp 16 | Ht 62.0 in | Wt 145.0 lb

## 2014-01-11 DIAGNOSIS — Z23 Encounter for immunization: Secondary | ICD-10-CM

## 2014-01-11 DIAGNOSIS — F17200 Nicotine dependence, unspecified, uncomplicated: Secondary | ICD-10-CM | POA: Insufficient documentation

## 2014-01-11 DIAGNOSIS — Z833 Family history of diabetes mellitus: Secondary | ICD-10-CM | POA: Insufficient documentation

## 2014-01-11 DIAGNOSIS — E119 Type 2 diabetes mellitus without complications: Secondary | ICD-10-CM | POA: Diagnosis present

## 2014-01-11 DIAGNOSIS — I10 Essential (primary) hypertension: Secondary | ICD-10-CM | POA: Diagnosis not present

## 2014-01-11 DIAGNOSIS — R232 Flushing: Secondary | ICD-10-CM

## 2014-01-11 DIAGNOSIS — Z8249 Family history of ischemic heart disease and other diseases of the circulatory system: Secondary | ICD-10-CM | POA: Diagnosis not present

## 2014-01-11 DIAGNOSIS — N951 Menopausal and female climacteric states: Secondary | ICD-10-CM | POA: Diagnosis not present

## 2014-01-11 LAB — POCT GLYCOSYLATED HEMOGLOBIN (HGB A1C): Hemoglobin A1C: 7.8

## 2014-01-11 LAB — GLUCOSE, POCT (MANUAL RESULT ENTRY): POC Glucose: 107 mg/dl — AB (ref 70–99)

## 2014-01-11 MED ORDER — LISINOPRIL 20 MG PO TABS: 20.0000 mg | ORAL_TABLET | Freq: Every day | ORAL | Status: DC

## 2014-01-11 MED ORDER — HYDROCHLOROTHIAZIDE 25 MG PO TABS: 25.0000 mg | ORAL_TABLET | Freq: Every day | ORAL | Status: DC

## 2014-01-11 MED ORDER — GLIPIZIDE ER 5 MG PO TB24: 5.0000 mg | ORAL_TABLET | Freq: Every day | ORAL | Status: DC

## 2014-01-11 MED ORDER — VENLAFAXINE HCL 37.5 MG PO TABS: 37.5000 mg | ORAL_TABLET | Freq: Two times a day (BID) | ORAL | Status: DC

## 2014-01-11 MED ORDER — METFORMIN HCL 1000 MG PO TABS: 1000.0000 mg | ORAL_TABLET | Freq: Two times a day (BID) | ORAL | Status: DC

## 2014-01-11 NOTE — Progress Notes (Signed)
Pt is here following up on her HTN and diabetes.

## 2014-01-11 NOTE — Patient Instructions (Signed)
Smoking Cessation Quitting smoking is important to your health and has many advantages. However, it is not always easy to quit since nicotine is a very addictive drug. Oftentimes, people try 3 times or more before being able to quit. This document explains the best ways for you to prepare to quit smoking. Quitting takes hard work and a lot of effort, but you can do it. ADVANTAGES OF QUITTING SMOKING  You will live longer, feel better, and live better.  Your body will feel the impact of quitting smoking almost immediately.  Within 20 minutes, blood pressure decreases. Your pulse returns to its normal level.  After 8 hours, carbon monoxide levels in the blood return to normal. Your oxygen level increases.  After 24 hours, the chance of having a heart attack starts to decrease. Your breath, hair, and body stop smelling like smoke.  After 48 hours, damaged nerve endings begin to recover. Your sense of taste and smell improve.  After 72 hours, the body is virtually free of nicotine. Your bronchial tubes relax and breathing becomes easier.  After 2 to 12 weeks, lungs can hold more air. Exercise becomes easier and circulation improves.  The risk of having a heart attack, stroke, cancer, or lung disease is greatly reduced.  After 1 year, the risk of coronary heart disease is cut in half.  After 5 years, the risk of stroke falls to the same as a nonsmoker.  After 10 years, the risk of lung cancer is cut in half and the risk of other cancers decreases significantly.  After 15 years, the risk of coronary heart disease drops, usually to the level of a nonsmoker.  If you are pregnant, quitting smoking will improve your chances of having a healthy baby.  The people you live with, especially any children, will be healthier.  You will have extra money to spend on things other than cigarettes. QUESTIONS TO THINK ABOUT BEFORE ATTEMPTING TO QUIT You may want to talk about your answers with your  health care provider.  Why do you want to quit?  If you tried to quit in the past, what helped and what did not?  What will be the most difficult situations for you after you quit? How will you plan to handle them?  Who can help you through the tough times? Your family? Friends? A health care provider?  What pleasures do you get from smoking? What ways can you still get pleasure if you quit? Here are some questions to ask your health care provider:  How can you help me to be successful at quitting?  What medicine do you think would be best for me and how should I take it?  What should I do if I need more help?  What is smoking withdrawal like? How can I get information on withdrawal? GET READY  Set a quit date.  Change your environment by getting rid of all cigarettes, ashtrays, matches, and lighters in your home, car, or work. Do not let people smoke in your home.  Review your past attempts to quit. Think about what worked and what did not. GET SUPPORT AND ENCOURAGEMENT You have a better chance of being successful if you have help. You can get support in many ways.  Tell your family, friends, and coworkers that you are going to quit and need their support. Ask them not to smoke around you.  Get individual, group, or telephone counseling and support. Programs are available at local hospitals and health centers. Call   your local health department for information about programs in your area.  Spiritual beliefs and practices may help some smokers quit.  Download a "quit meter" on your computer to keep track of quit statistics, such as how long you have gone without smoking, cigarettes not smoked, and money saved.  Get a self-help book about quitting smoking and staying off tobacco. LEARN NEW SKILLS AND BEHAVIORS  Distract yourself from urges to smoke. Talk to someone, go for a walk, or occupy your time with a task.  Change your normal routine. Take a different route to work.  Drink tea instead of coffee. Eat breakfast in a different place.  Reduce your stress. Take a hot bath, exercise, or read a book.  Plan something enjoyable to do every day. Reward yourself for not smoking.  Explore interactive web-based programs that specialize in helping you quit. GET MEDICINE AND USE IT CORRECTLY Medicines can help you stop smoking and decrease the urge to smoke. Combining medicine with the above behavioral methods and support can greatly increase your chances of successfully quitting smoking.  Nicotine replacement therapy helps deliver nicotine to your body without the negative effects and risks of smoking. Nicotine replacement therapy includes nicotine gum, lozenges, inhalers, nasal sprays, and skin patches. Some may be available over-the-counter and others require a prescription.  Antidepressant medicine helps people abstain from smoking, but how this works is unknown. This medicine is available by prescription.  Nicotinic receptor partial agonist medicine simulates the effect of nicotine in your brain. This medicine is available by prescription. Ask your health care provider for advice about which medicines to use and how to use them based on your health history. Your health care provider will tell you what side effects to look out for if you choose to be on a medicine or therapy. Carefully read the information on the package. Do not use any other product containing nicotine while using a nicotine replacement product.  RELAPSE OR DIFFICULT SITUATIONS Most relapses occur within the first 3 months after quitting. Do not be discouraged if you start smoking again. Remember, most people try several times before finally quitting. You may have symptoms of withdrawal because your body is used to nicotine. You may crave cigarettes, be irritable, feel very hungry, cough often, get headaches, or have difficulty concentrating. The withdrawal symptoms are only temporary. They are strongest  when you first quit, but they will go away within 10-14 days. To reduce the chances of relapse, try to:  Avoid drinking alcohol. Drinking lowers your chances of successfully quitting.  Reduce the amount of caffeine you consume. Once you quit smoking, the amount of caffeine in your body increases and can give you symptoms, such as a rapid heartbeat, sweating, and anxiety.  Avoid smokers because they can make you want to smoke.  Do not let weight gain distract you. Many smokers will gain weight when they quit, usually less than 10 pounds. Eat a healthy diet and stay active. You can always lose the weight gained after you quit.  Find ways to improve your mood other than smoking. FOR MORE INFORMATION  www.smokefree.gov  Document Released: 04/10/2001 Document Revised: 08/31/2013 Document Reviewed: 07/26/2011 [employer] Patient Information 2015 [employer]. This information is not intended to replace advice given to you by your health care provider. Make sure you discuss any questions you have with your health care provider.  

## 2014-01-19 ENCOUNTER — Telehealth: Payer: Self-pay

## 2014-01-19 MED ORDER — SIMVASTATIN 40 MG PO TABS: 40.0000 mg | ORAL_TABLET | Freq: Every day | ORAL | Status: DC

## 2014-01-19 NOTE — Telephone Encounter (Signed)
Patient called requesting a refill on her simvastatin Medication sent to [facility] on [address]

## 2014-01-25 ENCOUNTER — Encounter: Payer: Self-pay | Admitting: Internal Medicine

## 2014-01-25 NOTE — Progress Notes (Signed)
Patient ID: Haley Mendez, female   DOB: 1955/11/16, 58 y.o.   MRN: [id number]  CC: DM/HTN   HPI:  Patient presents to clinic today for a follow up of diabetes and hypertension.  Patient has not had a follow up visit since March of this year.  She reports that she was only taking Metformin for her diabetes and states that she has not been eating very healthy.  She explains that she was doing very well before managing her diabetes but admits to slacking off some.  She reports that she takes her BP medication regularly but continues to smoke daily.     No Known Allergies Past Medical History  Diagnosis Date  . Arthritis   . Bone spur of other site     left rotator cuff  . Diabetes mellitus without complication   . Hypertension   . Hyperlipidemia   . History of colon polyps    Current Outpatient Prescriptions on File Prior to Visit  Medication Sig Dispense Refill  . atorvastatin (LIPITOR) 20 MG tablet Take 1 tablet (20 mg total) by mouth daily.  30 tablet  5  . Blood Glucose Monitoring Suppl (ONE TOUCH ULTRA 2) W/DEVICE KIT Use to check blood sugars Dx 250.00  1 each  0  . ciclopirox (LOPROX) 0.77 % cream Apply topically 2 (two) times daily.  15 g  2  . glucose blood (ONE TOUCH TEST STRIPS) test strip 1 each by Other route daily. Use to check blood sugar daily Dx 250.00  100 each  3  . glucose blood test strip Use as instructed  100 each  12  . glucose monitoring kit (FREESTYLE) monitoring kit 1 each by Does not apply route as needed for other.  1 each  2  . ONETOUCH DELICA LANCETS FINE MISC 1 each by Does not apply route daily. Dx 250.00  100 each  3  . naproxen (NAPROSYN) 500 MG tablet Take 1 tablet (500 mg total) by mouth 2 (two) times daily with a meal.  60 tablet  2  . promethazine-codeine (PHENERGAN WITH CODEINE) 6.25-10 MG/5ML syrup Take 5 mLs by mouth every 4 (four) hours as needed for cough.  180 mL  0  . quinapril (ACCUPRIL) 40 MG tablet TAKE ONE TABLET BY MOUTH EVERY DAY  30  tablet  0  . sulfamethoxazole-trimethoprim (BACTRIM DS) 800-160 MG per tablet Take 1 tablet by mouth 2 (two) times daily.  14 tablet  0   No current facility-administered medications on file prior to visit.   Family History  Problem Relation Age of Onset  . Early death Mother   . Diabetes Father   . Hypertension Father   . Diabetes Brother   . Diabetes Brother    History   Social History  . Marital Status: Single    Spouse Name: N/A    Number of Children: 2  . 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 Smoking status: Current Every Day Smoker -- 0.50 packs/day for 15 years    Types: Cigarettes  . Smokeless tobacco: Never Used  . Alcohol Use: No  . Drug Use: No  . Sexual Activity: Not on file   Other Topics Concern  . Not on file   Social History Narrative  . No narrative on file    Review of Systems: Constitutional: Negative for fever, chills, diaphoresis, activity change, appetite change and fatigue. HENT: Negative for ear pain,  nosebleeds, congestion, facial swelling, rhinorrhea, neck pain, neck stiffness and ear discharge.  Eyes: Negative for pain, discharge, redness, itching and visual disturbance. Respiratory: Negative for cough, choking, chest tightness, shortness of breath, wheezing and stridor.  Cardiovascular: Negative for chest pain, palpitations and leg swelling. Gastrointestinal: Negative for abdominal distention. Genitourinary: Negative for dysuria, urgency, frequency, hematuria, flank pain, decreased urine volume, difficulty urinating and dyspareunia.  Musculoskeletal: Negative for back pain, joint swelling, arthralgias and gait problem. Neurological: Negative for dizziness, tremors, seizures, syncope, facial asymmetry, speech difficulty, weakness, light-headedness, numbness and headaches.  Hematological: Negative for adenopathy. Does not bruise/bleed easily. Psychiatric/Behavioral: Negative for  hallucinations, behavioral problems, confusion, dysphoric mood, decreased concentration and agitation.    Objective:   Filed Vitals:   01/11/14 1252  BP: 120/76  Pulse: 60  Temp: 99 F (37.2 C)  Resp: 16    Physical Exam: Constitutional: Patient appears well-developed and well-nourished. No distress. HENT: Normocephalic, atraumatic, External right and left ear normal. Oropharynx is clear and moist.  Eyes: Conjunctivae and EOM are normal. PERRLA, no scleral icterus. Neck: Normal ROM. Neck supple. No JVD. No tracheal deviation. No thyromegaly. CVS: RRR, S1/S2 +, no murmurs, no gallops, no carotid bruit.  Pulmonary: Effort and breath sounds normal, no stridor, rhonchi, wheezes, rales.  Abdominal: Soft. BS +,  no distension, tenderness, rebound or guarding.  Musculoskeletal: Normal range of motion. No edema and no tenderness.  Lymphadenopathy: No lymphadenopathy noted, cervical Neuro: Alert. Normal reflexes, muscle tone coordination. No cranial nerve deficit. Skin: Skin is warm and dry. No rash noted. Not diaphoretic. No erythema. No pallor. Psychiatric: Normal mood and affect. Behavior, judgment, thought content normal.  Lab Results  Component Value Date   WBC 9.4 07/13/2013   HGB 12.2 07/13/2013   HCT 36.7 07/13/2013   MCV 80.1 07/13/2013   PLT 290 07/13/2013   Lab Results  Component Value Date   CREATININE 0.66 07/13/2013   BUN 11 07/13/2013   NA 139 07/13/2013   K 4.3 07/13/2013   CL 109 07/13/2013   CO2 18* 07/13/2013    Lab Results  Component Value Date   HGBA1C 7.8 01/11/2014   Lipid Panel     Component Value Date/Time   CHOL 153 07/13/2013 1530   TRIG 96 07/13/2013 1530   HDL 39* 07/13/2013 1530   CHOLHDL 3.9 07/13/2013 1530   VLDL 19 07/13/2013 1530   LDLCALC 95 07/13/2013 1530       Assessment and plan:   Haley Mendez was seen today for follow-up.  Diagnoses and associated orders for this visit:  Type 2 diabetes mellitus without complication - Glucose (CBG) - HgB  A1c--increased to 7.8% from 6.5% in March - metFORMIN (GLUCOPHAGE) 1000 MG tablet; Take 1 tablet (1,000 mg total) by mouth 2 (two) times daily with a meal. - Begin new dose glipiZIDE (GLUCOTROL XL) 5 MG 24 hr tablet; Take 1 tablet (5 mg total) by mouth daily with breakfast.  Essential hypertension - hydrochlorothiazide (HYDRODIURIL) 25 MG tablet; Take 1 tablet (25 mg total) by mouth daily. - lisinopril (PRINIVIL,ZESTRIL) 20 MG tablet; Take 1 tablet (20 mg total) by mouth daily.  Hot flashes - venlafaxine (EFFEXOR) 37.5 MG tablet; Take 1 tablet (37.5 mg total) by mouth 2 (two) times daily.  To help with vasomotor symptoms of menopause  Tobacco use disorder Smoking cessation discussed, patient not ready to quit smoking at this time. Will address at each visit  Need for prophylactic vaccination and inoculation against influenza Received    Return  in about 3 months (around 04/12/2014) for DM/HTN.       Chari Manning, NP-C [facility] [phone] 01/25/2014, 6:26 PM

## 2014-07-02 ENCOUNTER — Other Ambulatory Visit: Payer: Self-pay | Admitting: Internal Medicine

## 2014-08-04 ENCOUNTER — Other Ambulatory Visit: Payer: Self-pay | Admitting: Internal Medicine

## 2014-08-06 ENCOUNTER — Telehealth: Payer: Self-pay | Admitting: Internal Medicine

## 2014-08-06 NOTE — Telephone Encounter (Signed)
Pt is requesting refill on bp meds, please send to [facility] on [address]. Pt is aware that it has been a while since last appt and was advised to schedule an appt.

## 2014-08-09 ENCOUNTER — Other Ambulatory Visit: Payer: Self-pay

## 2014-08-09 DIAGNOSIS — I10 Essential (primary) hypertension: Secondary | ICD-10-CM

## 2014-08-09 MED ORDER — LISINOPRIL 20 MG PO TABS: 20.0000 mg | ORAL_TABLET | Freq: Every day | ORAL | Status: DC

## 2014-08-20 ENCOUNTER — Ambulatory Visit: Payer: No Typology Code available for payment source | Admitting: Internal Medicine

## 2014-09-10 ENCOUNTER — Ambulatory Visit: Payer: 59 | Attending: Internal Medicine | Admitting: Internal Medicine

## 2014-09-10 ENCOUNTER — Encounter: Payer: Self-pay | Admitting: Internal Medicine

## 2014-09-10 VITALS — BP 142/86 | HR 81 | Temp 98.3°F | Ht 62.0 in | Wt 147.0 lb

## 2014-09-10 DIAGNOSIS — E785 Hyperlipidemia, unspecified: Secondary | ICD-10-CM | POA: Diagnosis not present

## 2014-09-10 DIAGNOSIS — Z86010 Personal history of colonic polyps: Secondary | ICD-10-CM | POA: Diagnosis not present

## 2014-09-10 DIAGNOSIS — Z720 Tobacco use: Secondary | ICD-10-CM

## 2014-09-10 DIAGNOSIS — E1140 Type 2 diabetes mellitus with diabetic neuropathy, unspecified: Secondary | ICD-10-CM | POA: Diagnosis not present

## 2014-09-10 DIAGNOSIS — E119 Type 2 diabetes mellitus without complications: Secondary | ICD-10-CM | POA: Diagnosis not present

## 2014-09-10 DIAGNOSIS — I10 Essential (primary) hypertension: Secondary | ICD-10-CM

## 2014-09-10 DIAGNOSIS — G4700 Insomnia, unspecified: Secondary | ICD-10-CM | POA: Insufficient documentation

## 2014-09-10 DIAGNOSIS — F17210 Nicotine dependence, cigarettes, uncomplicated: Secondary | ICD-10-CM | POA: Insufficient documentation

## 2014-09-10 DIAGNOSIS — F17200 Nicotine dependence, unspecified, uncomplicated: Secondary | ICD-10-CM

## 2014-09-10 LAB — GLUCOSE, POCT (MANUAL RESULT ENTRY): POC GLUCOSE: 159 mg/dL — AB (ref 70–99)

## 2014-09-10 LAB — POCT GLYCOSYLATED HEMOGLOBIN (HGB A1C): HEMOGLOBIN A1C: 7.3

## 2014-09-10 MED ORDER — HYDROCHLOROTHIAZIDE 25 MG PO TABS: 25.0000 mg | ORAL_TABLET | Freq: Every day | ORAL | Status: DC

## 2014-09-10 MED ORDER — METFORMIN HCL 1000 MG PO TABS: 1000.0000 mg | ORAL_TABLET | Freq: Two times a day (BID) | ORAL | Status: DC

## 2014-09-10 MED ORDER — ONETOUCH ULTRA 2 W/DEVICE KIT: PACK | Status: DC

## 2014-09-10 MED ORDER — GLUCOSE BLOOD VI STRP: ORAL_STRIP | Status: DC

## 2014-09-10 MED ORDER — GLIPIZIDE ER 5 MG PO TB24: 5.0000 mg | ORAL_TABLET | Freq: Every day | ORAL | Status: DC

## 2014-09-10 MED ORDER — LISINOPRIL 20 MG PO TABS: 20.0000 mg | ORAL_TABLET | Freq: Every day | ORAL | Status: DC

## 2014-09-10 MED ORDER — SIMVASTATIN 40 MG PO TABS: 40.0000 mg | ORAL_TABLET | Freq: Every day | ORAL | Status: DC

## 2014-09-10 NOTE — Patient Instructions (Signed)
Melatonin at [facility] for sleep. Take around 8 pm.

## 2014-09-10 NOTE — Progress Notes (Signed)
Patient here to follow up on her diabetes She reports she is feeling more tired than usual Patient needs refills on all medications she has not taken her blood pressure pills for the last 3 days ( pressure today is 142/86) and she would like 6 months supply if possible.

## 2014-09-10 NOTE — Progress Notes (Signed)
Patient ID: Haley Mendez, female   DOB: 11/22/1955, 59 y.o.   MRN: [id number] 1. HTN: Medication: lisinopril 20 mg, HCTZ 25. Has been out of medication for 3 days  Home BP monitoring: Does not check at home  Positive ROS none  Negative SPQ:ZRAQTMAUQ, chest pain, edema   2. DM2:  Medication: Glipizide, Metformin  Home CBG monitoring: does not check  Hypoglycemic event: does not check  Positive ROS neuropathy, blurred vision (03/2013 last eye exam)  Negative ROS: polyuria/dipsia  3. HLD: Medication: Zocor 40 mg  Tolerance: well  Positive ROS none  Negative JFH:[id number], myalgia  4. Unable to sleep at night. She falls asleep around 5 am every morning. She feels very sleepy throughout the day at work and is afraid she will get fired. When she does fall asleep early she does not stay asleep. Thinks she may have some depression.  Feels worthless because she is unable to pay her bills.   Social History reviewed: Smoker daily--6 cigarettes per day Exercise no  GAD-7 of 21 and PHQ-9 of 12  Physical Exam  Constitutional: She is oriented to person, place, and time.  Cardiovascular: Normal rate, regular rhythm and normal heart sounds.   Pulmonary/Chest: Effort normal and breath sounds normal.  Musculoskeletal: She exhibits no edema.  Neurological: She is alert and oriented to person, place, and time.  Skin: Skin is warm and dry.   Haley Mendez was seen today for follow-up and diabetes.  Diagnoses and all orders for this visit:  Type 2 diabetes mellitus without complication Orders: -     Glucose (CBG) -     HgB A1c -     Microalbumin, urine -    Refill metFORMIN (GLUCOPHAGE) 1000 MG tablet; Take 1 tablet (1,000 mg total) by mouth 2 (two) times daily with a meal. -    Refill glipiZIDE (GLUCOTROL XL) 5 MG 24 hr tablet; Take 1 tablet (5 mg total) by mouth daily with breakfast. -     COMPLETE METABOLIC PANEL WITH GFR; Future -     CBC; Future -     Blood Glucose Monitoring Suppl (ONE  TOUCH ULTRA 2) W/DEVICE KIT; BS check BID for E11.9 -     glucose blood test strip; BS check BID for E11.9 Patients diabetes is well control as evidence by consistently low a1c.  Patient will continue with current therapy and continue to make necessary lifestyle changes.  Reviewed foot care, diet, exercise, annual health maintenance with patient.   Essential hypertension Orders: -     lisinopril (PRINIVIL,ZESTRIL) 20 MG tablet; Take 1 tablet (20 mg total) by mouth daily. -     hydrochlorothiazide (HYDRODIURIL) 25 MG tablet; Take 1 tablet (25 mg total) by mouth daily. Needs better control. Explained to patient the importance of taking medication daily.  HLD (hyperlipidemia) Orders: -     Lipid panel; Future -     simvastatin (ZOCOR) 40 MG tablet; Take 1 tablet (40 mg total) by mouth at bedtime. Education provided on proper lifestyle changes in order to lower cholesterol. Patient advised to maintain healthy weight and to keep total fat intake at 25-35% of total calories and carbohydrates 50-60% of total daily calories. Explained how high cholesterol places patient at risk for heart disease. Patient placed on appropriate medication and repeat labs in 6 months   TOBACCO ABUSE Smoking cessation discussed for 3 minutes, patient is not willing to quit at this time. Will continue to assess on each visit. Discussed increased risk for diseases such  as cancer, heart disease, and stroke.   Insomnia Patient will try OTC Melatonin for sleep   Hx of colonic polyp Orders: -     HM COLONOSCOPY--time for repeat test. Explained that once she gets the appointment she will need to have old records transferred for comparison   Return in about 1 week (around 09/17/2014) for Lab Visit and 6 mo PCP , DM/HTN.  Chari Manning, NP 09/21/2014 12:18 AM

## 2014-09-11 LAB — MICROALBUMIN, URINE: Microalb, Ur: 0.6 mg/dL (ref <2.0)

## 2014-09-21 ENCOUNTER — Encounter: Payer: Self-pay | Admitting: Internal Medicine

## 2014-09-24 ENCOUNTER — Other Ambulatory Visit: Payer: 59

## 2015-03-11 ENCOUNTER — Other Ambulatory Visit: Payer: Self-pay | Admitting: Internal Medicine

## 2015-03-15 ENCOUNTER — Telehealth: Payer: Self-pay

## 2015-03-15 DIAGNOSIS — I10 Essential (primary) hypertension: Secondary | ICD-10-CM

## 2015-03-15 MED ORDER — LISINOPRIL 20 MG PO TABS: 20.0000 mg | ORAL_TABLET | Freq: Every day | ORAL | Status: DC

## 2015-03-15 NOTE — Telephone Encounter (Signed)
Patient called requesting a refill on her lisinopril rx sent to pharmacy on file

## 2015-04-01 ENCOUNTER — Ambulatory Visit: Payer: 59 | Attending: Internal Medicine | Admitting: Internal Medicine

## 2015-04-01 ENCOUNTER — Encounter: Payer: Self-pay | Admitting: Internal Medicine

## 2015-04-01 VITALS — BP 120/75 | HR 79 | Temp 98.0°F | Resp 16 | Wt 143.4 lb

## 2015-04-01 DIAGNOSIS — Z7984 Long term (current) use of oral hypoglycemic drugs: Secondary | ICD-10-CM | POA: Insufficient documentation

## 2015-04-01 DIAGNOSIS — I10 Essential (primary) hypertension: Secondary | ICD-10-CM | POA: Diagnosis not present

## 2015-04-01 DIAGNOSIS — E119 Type 2 diabetes mellitus without complications: Secondary | ICD-10-CM | POA: Diagnosis present

## 2015-04-01 DIAGNOSIS — T162XXA Foreign body in left ear, initial encounter: Secondary | ICD-10-CM

## 2015-04-01 DIAGNOSIS — E785 Hyperlipidemia, unspecified: Secondary | ICD-10-CM | POA: Insufficient documentation

## 2015-04-01 DIAGNOSIS — Z1239 Encounter for other screening for malignant neoplasm of breast: Secondary | ICD-10-CM | POA: Diagnosis not present

## 2015-04-01 DIAGNOSIS — Z79899 Other long term (current) drug therapy: Secondary | ICD-10-CM | POA: Diagnosis not present

## 2015-04-01 DIAGNOSIS — H6122 Impacted cerumen, left ear: Secondary | ICD-10-CM | POA: Diagnosis not present

## 2015-04-01 LAB — BASIC METABOLIC PANEL
BUN: 22 mg/dL (ref 7–25)
CHLORIDE: 110 mmol/L (ref 98–110)
CO2: 21 mmol/L (ref 20–31)
Calcium: 9.9 mg/dL (ref 8.6–10.4)
Creat: 0.69 mg/dL (ref 0.50–0.99)
Glucose, Bld: 143 mg/dL — ABNORMAL HIGH (ref 65–99)
POTASSIUM: 4 mmol/L (ref 3.5–5.3)
SODIUM: 141 mmol/L (ref 135–146)

## 2015-04-01 LAB — POCT GLYCOSYLATED HEMOGLOBIN (HGB A1C): HEMOGLOBIN A1C: 8.2

## 2015-04-01 LAB — GLUCOSE, POCT (MANUAL RESULT ENTRY): POC GLUCOSE: 179 mg/dL — AB (ref 70–99)

## 2015-04-01 MED ORDER — SIMVASTATIN 40 MG PO TABS: 40.0000 mg | ORAL_TABLET | Freq: Every day | ORAL | Status: DC

## 2015-04-01 MED ORDER — METFORMIN HCL 1000 MG PO TABS: 1000.0000 mg | ORAL_TABLET | Freq: Two times a day (BID) | ORAL | Status: DC

## 2015-04-01 MED ORDER — GLIPIZIDE ER 5 MG PO TB24: 5.0000 mg | ORAL_TABLET | Freq: Every day | ORAL | Status: DC

## 2015-04-01 MED ORDER — LISINOPRIL 20 MG PO TABS: 20.0000 mg | ORAL_TABLET | Freq: Every day | ORAL | Status: DC

## 2015-04-01 MED ORDER — HYDROCHLOROTHIAZIDE 25 MG PO TABS: 25.0000 mg | ORAL_TABLET | Freq: Every day | ORAL | Status: DC

## 2015-04-01 NOTE — Progress Notes (Signed)
Patient ID: Haley Mendez, female   DOB: 1955/05/13, 59 y.o.   MRN: [id number] SUBJECTIVE: 59 y.o. female for follow up of diabetes, HLD, and hypertension. Diabetic Review of Systems - medication compliance: compliant most of the time, diabetic diet compliance: noncompliant some of the time, home glucose monitoring: is performed sporadically, further diabetic ROS: no polyuria or polydipsia, no chest pain, dyspnea or TIA's, no numbness, tingling or pain in extremities, no unusual visual symptoms, no hypoglycemia.  Home blood pressures have been normal. No chest pain, palpitations, edema, SOB. Other symptoms and concerns: She feels like she may have a cotton swab stuck in her left ear because it feels clogged.  Current Outpatient Prescriptions  Medication Sig Dispense Refill  . glipiZIDE (GLUCOTROL XL) 5 MG 24 hr tablet Take 1 tablet (5 mg total) by mouth daily with breakfast. 30 tablet 5  . hydrochlorothiazide (HYDRODIURIL) 25 MG tablet Take 1 tablet (25 mg total) by mouth daily. 30 tablet 5  . lisinopril (PRINIVIL,ZESTRIL) 20 MG tablet Take 1 tablet (20 mg total) by mouth daily. 30 tablet 0  . metFORMIN (GLUCOPHAGE) 1000 MG tablet Take 1 tablet (1,000 mg total) by mouth 2 (two) times daily with a meal. 60 tablet 5  . simvastatin (ZOCOR) 40 MG tablet Take 1 tablet (40 mg total) by mouth at bedtime. 90 tablet 3  . Blood Glucose Monitoring Suppl (ONE TOUCH ULTRA 2) W/DEVICE KIT BS check BID for E11.9 1 each 0  . glucose blood (ONE TOUCH TEST STRIPS) test strip 1 each by Other route daily. Use to check blood sugar daily Dx 250.00 100 each 3  . glucose blood test strip BS check BID for E11.9 100 each 12  . glucose monitoring kit (FREESTYLE) monitoring kit 1 each by Does not apply route as needed for other. 1 each 2  . ONETOUCH DELICA LANCETS FINE MISC 1 each by Does not apply route daily. Dx 250.00 100 each 3   No current facility-administered medications for this visit.    OBJECTIVE: Appearance:  alert, well appearing, and in no distress, oriented to person, place, and time and normal appearing weight. BP 120/75 mmHg  Pulse 79  Temp(Src) 98 F (36.7 C)  Resp 16  Wt 143 lb 6.4 oz (65.046 kg)  SpO2 100%  Physical Exam  Constitutional: She is oriented to person, place, and time.  HENT:  Right Ear: Tympanic membrane, external ear and ear canal normal.  Left Ear: A foreign body (cotton) is present.  Neck: No JVD present.  Cardiovascular: Normal rate, regular rhythm and normal heart sounds.   Pulmonary/Chest: Effort normal and breath sounds normal.  Musculoskeletal: She exhibits no edema.  Pain near right 5th toenail when touched--does not appear to be ingrown  Feet:  Right Foot:  Skin Integrity: Negative for skin breakdown.  Left Foot:  Skin Integrity: Negative for skin breakdown.  Neurological: She is alert and oriented to person, place, and time.  Skin: Skin is warm and dry.  Psychiatric: She has a normal mood and affect.     ASSESSMENT: [name] was seen today for follow-up.  Diagnoses and all orders for this visit:  Type 2 diabetes mellitus without complication, without long-term current use of insulin (HCC) -     Glucose (CBG) -     HgB A1c -     glipiZIDE (GLUCOTROL XL) 5 MG 24 hr tablet; Take 1 tablet (5 mg total) by mouth daily with breakfast. -     metFORMIN (GLUCOPHAGE) 1000 MG tablet;  Take 1 tablet (1,000 mg total) by mouth 2 (two) times daily with a meal. -     simvastatin (ZOCOR) 40 MG tablet; Take 1 tablet (40 mg total) by mouth at bedtime. -     Ambulatory referral to Podiatry A1C is trending up. I stressed to the patient she needs to focus on diet and exercise. I will not add additional medication at this time.  Essential hypertension -     hydrochlorothiazide (HYDRODIURIL) 25 MG tablet; Take 1 tablet (25 mg total) by mouth daily. -     lisinopril (PRINIVIL,ZESTRIL) 20 MG tablet; Take 1 tablet (20 mg total) by mouth daily. -     Basic Metabolic  Panel Patient blood pressure is stable and may continue on current medication.  Education on diet, exercise, and modifiable risk factors discussed. Will obtain appropriate labs as needed. Will follow up in 3-6 months.   Foreign body in ear, left, initial encounter -     Ear wax removal Cotton removed and TM visualized and normal.   Breast cancer screening -     MM Digital Screening; Future   Return in about 4 weeks (around 04/29/2015) for pap/blood and 3 mo PCP , DM/HTN.   Lance Bosch, NP 04/01/2015 5:43 PM

## 2015-04-01 NOTE — Progress Notes (Signed)
Patient here for follow up o her diabetes and HTN Patient is requesting a six  Month supply of her medications

## 2015-04-07 ENCOUNTER — Telehealth: Payer: Self-pay

## 2015-04-07 NOTE — Telephone Encounter (Signed)
-----   Message from Lance Bosch, NP sent at 04/05/2015  7:10 PM EST ----- Labs are within normal limits

## 2015-04-07 NOTE — Telephone Encounter (Signed)
Patient not available Left message on voice mail to return our call 

## 2015-04-19 ENCOUNTER — Telehealth: Payer: Self-pay

## 2015-04-19 DIAGNOSIS — I10 Essential (primary) hypertension: Secondary | ICD-10-CM

## 2015-04-19 MED ORDER — LISINOPRIL 20 MG PO TABS: 20.0000 mg | ORAL_TABLET | Freq: Every day | ORAL | Status: DC

## 2015-04-19 NOTE — Telephone Encounter (Signed)
Nurse called patient, patient verified date of birth. Patient had left message on nurses voicemail requesting refills for medications.  Patient explained to nurse hydrochlorothiazide and lisinopril have no refills.  Nurse called pharmacy to clarify number of refills.  Per pharmacy and according to patients chart, refills are on all medications except lisinopril. Nurse will send lisinopril to pharmacy with refills.

## 2015-04-28 ENCOUNTER — Telehealth: Payer: Self-pay

## 2015-04-28 ENCOUNTER — Telehealth: Payer: Self-pay | Admitting: Internal Medicine

## 2015-04-28 NOTE — Telephone Encounter (Signed)
Pt. Called requesting to know her Lab results. Please f/u with pt.

## 2015-04-28 NOTE — Telephone Encounter (Signed)
Returned call to patient and she is aware of her recent lab results Per patient request copy of blood work mailed to patient

## 2015-04-29 ENCOUNTER — Other Ambulatory Visit: Payer: 59 | Admitting: Internal Medicine

## 2015-06-01 ENCOUNTER — Other Ambulatory Visit: Payer: Self-pay | Admitting: Internal Medicine

## 2015-06-01 DIAGNOSIS — Z1231 Encounter for screening mammogram for malignant neoplasm of breast: Secondary | ICD-10-CM

## 2015-06-02 ENCOUNTER — Telehealth: Payer: Self-pay | Admitting: Internal Medicine

## 2015-06-02 ENCOUNTER — Telehealth: Payer: Self-pay

## 2015-06-02 DIAGNOSIS — E119 Type 2 diabetes mellitus without complications: Secondary | ICD-10-CM

## 2015-06-02 MED ORDER — GLIPIZIDE ER 5 MG PO TB24: 5.0000 mg | ORAL_TABLET | Freq: Every day | ORAL | Status: DC

## 2015-06-02 MED ORDER — SIMVASTATIN 40 MG PO TABS: 40.0000 mg | ORAL_TABLET | Freq: Every day | ORAL | Status: DC

## 2015-06-02 MED ORDER — METFORMIN HCL 1000 MG PO TABS: 1000.0000 mg | ORAL_TABLET | Freq: Two times a day (BID) | ORAL | Status: DC

## 2015-06-02 NOTE — Telephone Encounter (Signed)
Returned phone call to patient  Patient is requesting her medication be sent to [facility] on [address]

## 2015-06-02 NOTE — Telephone Encounter (Signed)
Pt. Called stating that her [employer] will not cover her medication. PT. Stated that she needs the following medications changed:  glipiZIDE (GLUCOTROL XL) 5 MG 24 hr tablet simvastatin (ZOCOR) 40 MG tablet   Pt. Stated that her [facility] does not accept [employer] and she needs the medications changed to the generic name. Pt will call back to see in which pharmacy she will like for her medications to be changed to. Please f/u

## 2015-06-20 ENCOUNTER — Ambulatory Visit
Admission: RE | Admit: 2015-06-20 | Discharge: 2015-06-20 | Disposition: A | Discharge status: Home / Self Care | Payer: BLUE CROSS/BLUE SHIELD | Source: Ambulatory Visit | Attending: Internal Medicine | Admitting: Internal Medicine

## 2015-06-20 DIAGNOSIS — Z1231 Encounter for screening mammogram for malignant neoplasm of breast: Secondary | ICD-10-CM

## 2015-06-27 ENCOUNTER — Telehealth: Payer: Self-pay

## 2015-06-27 NOTE — Telephone Encounter (Signed)
Called patient this am Patient not available Message left on voice mail to return our call 

## 2015-06-27 NOTE — Telephone Encounter (Signed)
-----   Message from Lance Bosch, NP sent at 06/26/2015  5:24 PM EST ----- Normal mammogram

## 2015-08-19 ENCOUNTER — Telehealth: Payer: Self-pay | Admitting: Internal Medicine

## 2015-08-19 NOTE — Telephone Encounter (Signed)
Patient dropped off paperwork to be completed by the doctor. °Please follow up  °

## 2015-08-23 NOTE — Telephone Encounter (Signed)
Patient returned nurse phone call.

## 2015-08-23 NOTE — Telephone Encounter (Signed)
Medical Assistant left message on patient's home and cell voicemail. Voicemail states to give a call back to Jailee Jaquez with [facility] at [phone].  

## 2015-08-23 NOTE — Telephone Encounter (Signed)
Patient would like to know status of paperwork for health exam. Patient needs paperwork by 4/28

## 2015-08-24 NOTE — Telephone Encounter (Signed)
Patient verified DOB Patient is aware of needing a TB test prior to paperwork being completed. Patient is scheduled for a nurse visit for Monday 08/29/15 and a read on 08/31/15 at 4:00pm. Patient expressed her understanding and had no further questions at this time.

## 2015-08-29 ENCOUNTER — Ambulatory Visit: Payer: BLUE CROSS/BLUE SHIELD | Attending: Internal Medicine

## 2015-08-29 DIAGNOSIS — Z021 Encounter for pre-employment examination: Secondary | ICD-10-CM | POA: Insufficient documentation

## 2015-08-29 MED ORDER — TUBERCULIN PPD 5 UNIT/0.1ML ID SOLN
5.0000 [IU] | Freq: Once | INTRADERMAL | Status: AC
  Administered 2015-08-29: 5 [IU] via INTRADERMAL

## 2015-08-29 NOTE — Patient Instructions (Addendum)
Please return within 48-72 hours for reading. Patient is instructed to return Wednesday, 08/31/2015 after 4:30 pm, or before 4:30 pm on Thursday, 09/01/2015.

## 2015-08-29 NOTE — Progress Notes (Signed)
Patient's here for TB test.  Patient was instructed to come back Wednesday, 08/31/15 after 4:30 pm, or before 4:30 pm on Thursday, 09/01/15 for reading.

## 2015-08-31 ENCOUNTER — Ambulatory Visit: Payer: BLUE CROSS/BLUE SHIELD | Attending: Internal Medicine | Admitting: *Deleted

## 2015-08-31 DIAGNOSIS — Z111 Encounter for screening for respiratory tuberculosis: Secondary | ICD-10-CM

## 2015-08-31 LAB — READ PPD: TB SKIN TEST: NEGATIVE

## 2015-08-31 NOTE — Progress Notes (Signed)
Patient here for TB read  Patient denies any pain or concerns at this time.

## 2015-08-31 NOTE — Patient Instructions (Signed)
Patient received a hard copy of results.

## 2015-09-05 ENCOUNTER — Other Ambulatory Visit: Payer: Self-pay | Admitting: Internal Medicine

## 2015-10-04 ENCOUNTER — Other Ambulatory Visit: Payer: Self-pay | Admitting: Internal Medicine

## 2015-10-04 NOTE — Telephone Encounter (Signed)
Pt. Called requesting a refill on the following medications:   simvastatin (ZOCOR) 40 MG tablet  metFORMIN (GLUCOPHAGE) 1000 MG tablet hydrochlorothiazide (HYDRODIURIL) 25 MG tablet glipiZIDE (GLUCOTROL XL) 5 MG 24 hr tablet  lisinopril (PRINIVIL,ZESTRIL) 20 MG tablet  Please f/u with pt.

## 2015-10-09 ENCOUNTER — Other Ambulatory Visit: Payer: Self-pay | Admitting: Internal Medicine

## 2015-10-11 ENCOUNTER — Telehealth: Payer: Self-pay | Admitting: Internal Medicine

## 2015-10-11 DIAGNOSIS — I10 Essential (primary) hypertension: Secondary | ICD-10-CM

## 2015-10-11 DIAGNOSIS — E119 Type 2 diabetes mellitus without complications: Secondary | ICD-10-CM

## 2015-10-11 MED ORDER — LISINOPRIL 20 MG PO TABS: 20.0000 mg | ORAL_TABLET | Freq: Every day | ORAL | Status: DC

## 2015-10-11 MED ORDER — GLIPIZIDE ER 5 MG PO TB24: 5.0000 mg | ORAL_TABLET | Freq: Every day | ORAL | Status: DC

## 2015-10-11 MED ORDER — METFORMIN HCL 1000 MG PO TABS: 1000.0000 mg | ORAL_TABLET | Freq: Two times a day (BID) | ORAL | Status: DC

## 2015-10-11 MED ORDER — HYDROCHLOROTHIAZIDE 25 MG PO TABS: 25.0000 mg | ORAL_TABLET | Freq: Every day | ORAL | Status: DC

## 2015-10-11 NOTE — Telephone Encounter (Signed)
I have refilled the medications x 30 days to last her until she can get an appt next month.

## 2015-10-11 NOTE — Telephone Encounter (Signed)
Medication Refill: Pt stating she needs refills on all her medication   Pt states she has called [place] three times trying to get her medications refilled Pt needs an appointment to reestablish care but we are currently booked  Pt will call to make appointment in July  Thank you

## 2015-11-08 ENCOUNTER — Other Ambulatory Visit: Payer: Self-pay | Admitting: Internal Medicine

## 2015-11-11 ENCOUNTER — Ambulatory Visit: Payer: BLUE CROSS/BLUE SHIELD | Admitting: Family Medicine

## 2015-11-28 ENCOUNTER — Ambulatory Visit: Payer: BLUE CROSS/BLUE SHIELD | Attending: Family Medicine | Admitting: Family Medicine

## 2015-11-28 ENCOUNTER — Encounter: Payer: Self-pay | Admitting: Family Medicine

## 2015-11-28 VITALS — BP 130/84 | HR 66 | Temp 98.5°F | Ht 62.75 in | Wt 136.6 lb

## 2015-11-28 DIAGNOSIS — I10 Essential (primary) hypertension: Secondary | ICD-10-CM | POA: Diagnosis not present

## 2015-11-28 DIAGNOSIS — F17210 Nicotine dependence, cigarettes, uncomplicated: Secondary | ICD-10-CM | POA: Diagnosis not present

## 2015-11-28 DIAGNOSIS — R634 Abnormal weight loss: Secondary | ICD-10-CM | POA: Insufficient documentation

## 2015-11-28 DIAGNOSIS — E119 Type 2 diabetes mellitus without complications: Secondary | ICD-10-CM | POA: Insufficient documentation

## 2015-11-28 DIAGNOSIS — Z1211 Encounter for screening for malignant neoplasm of colon: Secondary | ICD-10-CM | POA: Diagnosis not present

## 2015-11-28 DIAGNOSIS — Z6824 Body mass index (BMI) 24.0-24.9, adult: Secondary | ICD-10-CM | POA: Diagnosis not present

## 2015-11-28 DIAGNOSIS — H6122 Impacted cerumen, left ear: Secondary | ICD-10-CM | POA: Diagnosis not present

## 2015-11-28 DIAGNOSIS — Z79899 Other long term (current) drug therapy: Secondary | ICD-10-CM | POA: Insufficient documentation

## 2015-11-28 DIAGNOSIS — L84 Corns and callosities: Secondary | ICD-10-CM | POA: Diagnosis not present

## 2015-11-28 DIAGNOSIS — Z7984 Long term (current) use of oral hypoglycemic drugs: Secondary | ICD-10-CM | POA: Diagnosis not present

## 2015-11-28 DIAGNOSIS — R61 Generalized hyperhidrosis: Secondary | ICD-10-CM | POA: Insufficient documentation

## 2015-11-28 LAB — POCT GLYCOSYLATED HEMOGLOBIN (HGB A1C): Hemoglobin A1C: 7.4

## 2015-11-28 LAB — GLUCOSE, POCT (MANUAL RESULT ENTRY): POC GLUCOSE: 160 mg/dL — AB (ref 70–99)

## 2015-11-28 MED ORDER — LISINOPRIL 20 MG PO TABS: ORAL_TABLET | ORAL | 5 refills | Status: DC

## 2015-11-28 MED ORDER — METFORMIN HCL 1000 MG PO TABS: 1000.0000 mg | ORAL_TABLET | Freq: Two times a day (BID) | ORAL | 5 refills | Status: DC

## 2015-11-28 MED ORDER — ONETOUCH DELICA LANCETS FINE MISC: 1.0000 | Freq: Every day | 5 refills | Status: DC

## 2015-11-28 MED ORDER — HYDROCHLOROTHIAZIDE 25 MG PO TABS: 25.0000 mg | ORAL_TABLET | Freq: Every day | ORAL | 5 refills | Status: DC

## 2015-11-28 MED ORDER — ONETOUCH ULTRA 2 W/DEVICE KIT: PACK | 0 refills | Status: DC

## 2015-11-28 MED ORDER — GLIPIZIDE ER 5 MG PO TB24: 5.0000 mg | ORAL_TABLET | Freq: Every day | ORAL | 5 refills | Status: DC

## 2015-11-28 MED ORDER — SIMVASTATIN 40 MG PO TABS: 40.0000 mg | ORAL_TABLET | Freq: Every day | ORAL | 5 refills | Status: DC

## 2015-11-28 NOTE — Progress Notes (Signed)
Would like blood work done today- not fasting Not testing sugars at home

## 2015-11-28 NOTE — Patient Instructions (Signed)
Diabetes Mellitus and Food It is important for you to manage your blood sugar (glucose) level. Your blood glucose level can be greatly affected by what you eat. Eating healthier foods in the appropriate amounts throughout the day at about the same time each day will help you control your blood glucose level. It can also help slow or prevent worsening of your diabetes mellitus. Healthy eating may even help you improve the level of your blood pressure and reach or maintain a healthy weight.  General recommendations for healthful eating and cooking habits include:  Eating meals and snacks regularly. Avoid going long periods of time without eating to lose weight.  Eating a diet that consists mainly of plant-based foods, such as fruits, vegetables, nuts, legumes, and whole grains.  Using low-heat cooking methods, such as baking, instead of high-heat cooking methods, such as deep frying. Work with your dietitian to make sure you understand how to use the Nutrition Facts information on food labels. HOW CAN FOOD AFFECT ME? Carbohydrates Carbohydrates affect your blood glucose level more than any other type of food. Your dietitian will help you determine how many carbohydrates to eat at each meal and teach you how to count carbohydrates. Counting carbohydrates is important to keep your blood glucose at a healthy level, especially if you are using insulin or taking certain medicines for diabetes mellitus. Alcohol Alcohol can cause sudden decreases in blood glucose (hypoglycemia), especially if you use insulin or take certain medicines for diabetes mellitus. Hypoglycemia can be a life-threatening condition. Symptoms of hypoglycemia (sleepiness, dizziness, and disorientation) are similar to symptoms of having too much alcohol.  If your health care provider has given you approval to drink alcohol, do so in moderation and use the following guidelines:  Women should not have more than one drink per day, and men  should not have more than two drinks per day. One drink is equal to:  12 oz of beer.  5 oz of wine.  1 oz of hard liquor.  Do not drink on an empty stomach.  Keep yourself hydrated. Have water, diet soda, or unsweetened iced tea.  Regular soda, juice, and other mixers might contain a lot of carbohydrates and should be counted. WHAT FOODS ARE NOT RECOMMENDED? As you make food choices, it is important to remember that all foods are not the same. Some foods have fewer nutrients per serving than other foods, even though they might have the same number of calories or carbohydrates. It is difficult to get your body what it needs when you eat foods with fewer nutrients. Examples of foods that you should avoid that are high in calories and carbohydrates but low in nutrients include:  Trans fats (most processed foods list trans fats on the Nutrition Facts label).  Regular soda.  Juice.  Candy.  Sweets, such as cake, pie, doughnuts, and cookies.  Fried foods. WHAT FOODS CAN I EAT? Eat nutrient-rich foods, which will nourish your body and keep you healthy. The food you should eat also will depend on several factors, including:  The calories you need.  The medicines you take.  Your weight.  Your blood glucose level.  Your blood pressure level.  Your cholesterol level. You should eat a variety of foods, including:  Protein.  Lean cuts of meat.  Proteins low in saturated fats, such as fish, egg whites, and beans. Avoid processed meats.  Fruits and vegetables.  Fruits and vegetables that may help control blood glucose levels, such as apples, mangoes, and   yams.  Dairy products.  Choose fat-free or low-fat dairy products, such as milk, yogurt, and cheese.  Grains, bread, pasta, and rice.  Choose whole grain products, such as multigrain bread, whole oats, and brown rice. These foods may help control blood pressure.  Fats.  Foods containing healthful fats, such as nuts,  avocado, olive oil, canola oil, and fish. DOES EVERYONE WITH DIABETES MELLITUS HAVE THE SAME MEAL PLAN? Because every person with diabetes mellitus is different, there is not one meal plan that works for everyone. It is very important that you meet with a dietitian who will help you create a meal plan that is just right for you.   This information is not intended to replace advice given to you by your health care provider. Make sure you discuss any questions you have with your health care provider.   Document Released: 01/11/2005 Document Revised: 05/07/2014 Document Reviewed: 03/13/2013 Elsevier Interactive Patient Education 2016 [employer]  

## 2015-11-28 NOTE — Progress Notes (Signed)
Subjective:  Patient ID: Haley Mendez, female    DOB: 1955/07/21  Age: 60 y.o. MRN: [id number]  CC: Diabetes; Tinnitus; Night Sweats; and Weight Loss   HPI Haley Mendez is a 60 year old female with a history of hypertension, type 2 diabetes mellitus (A1c 7.4), tobacco abuse who presents to establish care with me as she was previously followed by thepractitioner who is no longer with the practice.  Does not check her blood sugars regularly as she has no glucometer or test strips and is not up-to-date on eye exam. She would like a refill of her medication  She has lost 22 pounds in the last 2 years but then informs me that she does a lot of walking at her job which could account for that. She also noticed some ringing in her left ear and does have associated night sweats. Would like to be referred for colonoscopy.  Past Medical History:  Diagnosis Date  . Arthritis   . Bone spur of other site    left rotator cuff  . Diabetes mellitus without complication ([place])   . History of colon polyps   . Hyperlipidemia   . Hypertension     History reviewed. No pertinent surgical history.  No Known Allergies   Outpatient Medications Prior to Visit  Medication Sig Dispense Refill  . glipiZIDE (GLUCOTROL XL) 5 MG 24 hr tablet Take 1 tablet (5 mg total) by mouth daily with breakfast. 30 tablet 0  . hydrochlorothiazide (HYDRODIURIL) 25 MG tablet Take 1 tablet (25 mg total) by mouth daily. 30 tablet 0  . lisinopril (PRINIVIL,ZESTRIL) 20 MG tablet TAKE 1 TABLET (20 MG TOTAL) BY MOUTH DAILY. NEEDS OFFICE VISIT FOR REFILLS 30 tablet 0  . metFORMIN (GLUCOPHAGE) 1000 MG tablet Take 1 tablet (1,000 mg total) by mouth 2 (two) times daily with a meal. 60 tablet 0  . simvastatin (ZOCOR) 40 MG tablet TAKE 1 TABLET BY MOUTH AT BEDTIME 30 tablet 3  . glucose blood (ONE TOUCH TEST STRIPS) test strip 1 each by Other route daily. Use to check blood sugar daily Dx 250.00 (Patient not taking: Reported on  11/28/2015) 100 each 3  . Blood Glucose Monitoring Suppl (ONE TOUCH ULTRA 2) W/DEVICE KIT BS check BID for E11.9 (Patient not taking: Reported on 11/28/2015) 1 each 0  . glucose blood test strip BS check BID for E11.9 (Patient not taking: Reported on 11/28/2015) 100 each 12  . glucose monitoring kit (FREESTYLE) monitoring kit 1 each by Does not apply route as needed for other. (Patient not taking: Reported on 11/28/2015) 1 each 2  . ONETOUCH DELICA LANCETS FINE MISC 1 each by Does not apply route daily. Dx 250.00 (Patient not taking: Reported on 11/28/2015) 100 each 3   No facility-administered medications prior to visit.     ROS Review of Systems  Constitutional: Positive for unexpected weight change. Negative for activity change, appetite change and fatigue.  HENT: Positive for tinnitus. Negative for congestion, sinus pressure and sore throat.   Eyes: Negative for visual disturbance.  Respiratory: Negative for cough, chest tightness, shortness of breath and wheezing.   Cardiovascular: Negative for chest pain and palpitations.  Gastrointestinal: Negative for abdominal distention, abdominal pain and constipation.  Endocrine: Negative for polydipsia.  Genitourinary: Negative for dysuria and frequency.  Musculoskeletal: Negative for arthralgias and back pain.  Skin: Negative for rash.  Neurological: Negative for tremors, light-headedness and numbness.  Hematological: Does not bruise/bleed easily.  Psychiatric/Behavioral: Negative for agitation and behavioral problems.  Objective:  BP 130/84 (BP Location: Right Arm, Patient Position: Sitting, Cuff Size: Small)   Pulse 66   Temp 98.5 F (36.9 C) (Oral)   Ht 5' 2.75" (1.594 m)   Wt 136 lb 9.6 oz (62 kg)   SpO2 97%   BMI 24.39 kg/m   BP/Weight 11/28/2015 04/01/2015 8/93/8101  Systolic BP 751 025 852  Diastolic BP 84 75 86  Wt. (Lbs) 136.6 143.4 147  BMI 24.39 26.22 26.88      Physical Exam  Constitutional: She is oriented to  person, place, and time. She appears well-developed and well-nourished.  Cardiovascular: Normal rate, normal heart sounds and intact distal pulses.   No murmur heard. Pulmonary/Chest: Effort normal and breath sounds normal. She has no wheezes. She has no rales. She exhibits no tenderness.  Abdominal: Soft. Bowel sounds are normal. She exhibits no distension and no mass. There is no tenderness.  Musculoskeletal: Normal range of motion.  Neurological: She is alert and oriented to person, place, and time.  Skin: Skin is warm and dry.  Psychiatric: She has a normal mood and affect.     Assessment & Plan:   1. Type 2 diabetes mellitus without complication, unspecified long term insulin use status (HCC) A1c of 7.4; target is less than 7 Discussed diabetic diet, compliant with lifestyle modifications - Glucose (CBG) - HgB A1c - Ambulatory referral to Ophthalmology - Ambulatory referral to Podiatry  2. Type 2 diabetes mellitus without complication, without long-term current use of insulin (HCC) - glipiZIDE (GLUCOTROL XL) 5 MG 24 hr tablet; Take 1 tablet (5 mg total) by mouth daily with breakfast.  Dispense: 30 tablet; Refill: 5 - metFORMIN (GLUCOPHAGE) 1000 MG tablet; Take 1 tablet (1,000 mg total) by mouth 2 (two) times daily with a meal.  Dispense: 60 tablet; Refill: 5 - Blood Glucose Monitoring Suppl (ONE TOUCH ULTRA 2) w/Device KIT; BS check BID for E11.9  Dispense: 1 each; Refill: 0 - ONETOUCH DELICA LANCETS FINE MISC; 1 each by Does not apply route daily. Dx 250.00  Dispense: 100 each; Refill: 5  3. Essential hypertension Controlled - hydrochlorothiazide (HYDRODIURIL) 25 MG tablet; Take 1 tablet (25 mg total) by mouth daily.  Dispense: 30 tablet; Refill: 5 - lisinopril (PRINIVIL,ZESTRIL) 20 MG tablet; TAKE 1 TABLET (20 MG TOTAL) BY MOUTH DAILY.  Dispense: 30 tablet; Refill: 5  4. Screening for colon cancer - Ambulatory referral to Gastroenterology  5. Corn of toe Discussed diabetic  foot care - Ambulatory referral to Podiatry  6. Weight loss/night sweats We'll screen for thyroid disorders This could be menopausal symptoms The fact that she has been more active at her job could also explain weight loss.  7. Cerumen impaction Patient to use OTC ceruminolytics in left ear  She wanted a cbc done but declined after I have informed her I do not have an indication for that at this time and she would likely incur a bill if her insurance doesn't cover it.  Meds ordered this encounter  Medications  . glipiZIDE (GLUCOTROL XL) 5 MG 24 hr tablet    Sig: Take 1 tablet (5 mg total) by mouth daily with breakfast.    Dispense:  30 tablet    Refill:  5  . hydrochlorothiazide (HYDRODIURIL) 25 MG tablet    Sig: Take 1 tablet (25 mg total) by mouth daily.    Dispense:  30 tablet    Refill:  5  . lisinopril (PRINIVIL,ZESTRIL) 20 MG tablet    Sig: TAKE 1  TABLET (20 MG TOTAL) BY MOUTH DAILY.    Dispense:  30 tablet    Refill:  5  . metFORMIN (GLUCOPHAGE) 1000 MG tablet    Sig: Take 1 tablet (1,000 mg total) by mouth 2 (two) times daily with a meal.    Dispense:  60 tablet    Refill:  5  . Blood Glucose Monitoring Suppl (ONE TOUCH ULTRA 2) w/Device KIT    Sig: BS check BID for E11.9    Dispense:  1 each    Refill:  0    Give pt cheapest meter that insurance covers  . ONETOUCH DELICA LANCETS FINE MISC    Sig: 1 each by Does not apply route daily. Dx 250.00    Dispense:  100 each    Refill:  5  . simvastatin (ZOCOR) 40 MG tablet    Sig: Take 1 tablet (40 mg total) by mouth at bedtime.    Dispense:  30 tablet    Refill:  5    Follow-up: Return in about 3 months (around 02/28/2016) for Follow-up on diabetes mellitus.   Arnoldo Morale MD

## 2015-11-29 ENCOUNTER — Encounter: Payer: Self-pay | Admitting: Family Medicine

## 2015-12-02 ENCOUNTER — Ambulatory Visit: Payer: BLUE CROSS/BLUE SHIELD

## 2015-12-03 LAB — MICROALBUMIN / CREATININE URINE RATIO
CREATININE, URINE: 131 mg/dL (ref 20–320)
MICROALB/CREAT RATIO: 7 ug/mg{creat} (ref <30)
Microalb, Ur: 0.9 mg/dL

## 2015-12-06 ENCOUNTER — Ambulatory Visit: Payer: BLUE CROSS/BLUE SHIELD | Attending: Family Medicine

## 2015-12-06 DIAGNOSIS — E119 Type 2 diabetes mellitus without complications: Secondary | ICD-10-CM

## 2015-12-06 DIAGNOSIS — M1990 Unspecified osteoarthritis, unspecified site: Secondary | ICD-10-CM | POA: Diagnosis not present

## 2015-12-06 DIAGNOSIS — E118 Type 2 diabetes mellitus with unspecified complications: Secondary | ICD-10-CM | POA: Insufficient documentation

## 2015-12-06 DIAGNOSIS — F17210 Nicotine dependence, cigarettes, uncomplicated: Secondary | ICD-10-CM | POA: Insufficient documentation

## 2015-12-06 DIAGNOSIS — E785 Hyperlipidemia, unspecified: Secondary | ICD-10-CM | POA: Diagnosis not present

## 2015-12-06 DIAGNOSIS — M545 Low back pain: Secondary | ICD-10-CM | POA: Diagnosis not present

## 2015-12-06 DIAGNOSIS — B351 Tinea unguium: Secondary | ICD-10-CM | POA: Diagnosis not present

## 2015-12-06 DIAGNOSIS — I10 Essential (primary) hypertension: Secondary | ICD-10-CM | POA: Diagnosis not present

## 2015-12-06 DIAGNOSIS — K219 Gastro-esophageal reflux disease without esophagitis: Secondary | ICD-10-CM | POA: Diagnosis not present

## 2015-12-06 DIAGNOSIS — R232 Flushing: Secondary | ICD-10-CM

## 2015-12-07 ENCOUNTER — Telehealth: Payer: Self-pay

## 2015-12-07 LAB — COMPLETE METABOLIC PANEL WITH GFR
ALT: 11 U/L (ref 6–29)
AST: 14 U/L (ref 10–35)
Albumin: 4.3 g/dL (ref 3.6–5.1)
Alkaline Phosphatase: 73 U/L (ref 33–130)
BILIRUBIN TOTAL: 0.3 mg/dL (ref 0.2–1.2)
BUN: 16 mg/dL (ref 7–25)
CO2: 23 mmol/L (ref 20–31)
CREATININE: 0.65 mg/dL (ref 0.50–0.99)
Calcium: 9.2 mg/dL (ref 8.6–10.4)
Chloride: 102 mmol/L (ref 98–110)
GFR, Est African American: >89 mL/min (ref >=60)
GFR, Est Non African American: >89 mL/min (ref >=60)
GLUCOSE: 102 mg/dL — AB (ref 65–99)
Potassium: 5.3 mmol/L (ref 3.5–5.3)
SODIUM: 139 mmol/L (ref 135–146)
TOTAL PROTEIN: 6.8 g/dL (ref 6.1–8.1)

## 2015-12-07 LAB — LIPID PANEL
Cholesterol: 139 mg/dL (ref 125–200)
HDL: 49 mg/dL (ref >=46)
LDL CALC: 72 mg/dL (ref <130)
Total CHOL/HDL Ratio: 2.8 Ratio (ref <=5.0)
Triglycerides: 91 mg/dL (ref <150)
VLDL: 18 mg/dL (ref <30)

## 2015-12-07 LAB — TSH: TSH: 2.31 m[IU]/L

## 2015-12-07 NOTE — Telephone Encounter (Signed)
Contacted pt to go over lab results pt didn't answer lvm for pt to give me a call back at her earliest convenience

## 2015-12-07 NOTE — Telephone Encounter (Signed)
Writer called patient per Dr. Jarold Song to let her know that her lab results came back normal.  Patient stated understanding.

## 2015-12-07 NOTE — Telephone Encounter (Signed)
-----   Message from Arnoldo Morale, MD sent at 12/07/2015 12:44 PM EDT ----- Please inform the patient that labs are normal. Thank you.

## 2015-12-19 ENCOUNTER — Ambulatory Visit: Payer: BLUE CROSS/BLUE SHIELD | Admitting: Sports Medicine

## 2016-03-16 ENCOUNTER — Ambulatory Visit: Payer: BLUE CROSS/BLUE SHIELD | Attending: Family Medicine | Admitting: Family Medicine

## 2016-03-16 ENCOUNTER — Encounter: Payer: Self-pay | Admitting: Family Medicine

## 2016-03-16 VITALS — BP 97/64 | HR 75 | Temp 98.5°F | Ht 62.75 in | Wt 141.8 lb

## 2016-03-16 DIAGNOSIS — E7800 Pure hypercholesterolemia, unspecified: Secondary | ICD-10-CM | POA: Diagnosis not present

## 2016-03-16 DIAGNOSIS — B86 Scabies: Secondary | ICD-10-CM | POA: Insufficient documentation

## 2016-03-16 DIAGNOSIS — M1990 Unspecified osteoarthritis, unspecified site: Secondary | ICD-10-CM | POA: Diagnosis not present

## 2016-03-16 DIAGNOSIS — Z79899 Other long term (current) drug therapy: Secondary | ICD-10-CM | POA: Diagnosis not present

## 2016-03-16 DIAGNOSIS — R0981 Nasal congestion: Secondary | ICD-10-CM | POA: Diagnosis not present

## 2016-03-16 DIAGNOSIS — I10 Essential (primary) hypertension: Secondary | ICD-10-CM | POA: Insufficient documentation

## 2016-03-16 DIAGNOSIS — E119 Type 2 diabetes mellitus without complications: Secondary | ICD-10-CM

## 2016-03-16 DIAGNOSIS — Z1159 Encounter for screening for other viral diseases: Secondary | ICD-10-CM | POA: Diagnosis not present

## 2016-03-16 DIAGNOSIS — Z86010 Personal history of colonic polyps: Secondary | ICD-10-CM | POA: Insufficient documentation

## 2016-03-16 DIAGNOSIS — R21 Rash and other nonspecific skin eruption: Secondary | ICD-10-CM | POA: Diagnosis present

## 2016-03-16 DIAGNOSIS — Z7984 Long term (current) use of oral hypoglycemic drugs: Secondary | ICD-10-CM | POA: Diagnosis not present

## 2016-03-16 LAB — POCT GLYCOSYLATED HEMOGLOBIN (HGB A1C): HEMOGLOBIN A1C: 8.1

## 2016-03-16 LAB — COMPLETE METABOLIC PANEL WITH GFR
ALT: 14 U/L (ref 6–29)
AST: 16 U/L (ref 10–35)
Albumin: 4.3 g/dL (ref 3.6–5.1)
Alkaline Phosphatase: 86 U/L (ref 33–130)
BUN: 16 mg/dL (ref 7–25)
CHLORIDE: 104 mmol/L (ref 98–110)
CO2: 21 mmol/L (ref 20–31)
CREATININE: 0.8 mg/dL (ref 0.50–0.99)
Calcium: 9.9 mg/dL (ref 8.6–10.4)
GFR, Est Non African American: 80 mL/min (ref >=60)
Glucose, Bld: 115 mg/dL — ABNORMAL HIGH (ref 65–99)
POTASSIUM: 4.1 mmol/L (ref 3.5–5.3)
Sodium: 135 mmol/L (ref 135–146)
Total Bilirubin: 0.2 mg/dL (ref 0.2–1.2)
Total Protein: 7.1 g/dL (ref 6.1–8.1)

## 2016-03-16 LAB — GLUCOSE, POCT (MANUAL RESULT ENTRY): POC GLUCOSE: 138 mg/dL — AB (ref 70–99)

## 2016-03-16 MED ORDER — GLIPIZIDE ER 10 MG PO TB24: 10.0000 mg | ORAL_TABLET | Freq: Every day | ORAL | 5 refills | Status: DC

## 2016-03-16 MED ORDER — LISINOPRIL 20 MG PO TABS: ORAL_TABLET | ORAL | 5 refills | Status: DC

## 2016-03-16 MED ORDER — SIMVASTATIN 40 MG PO TABS: 40.0000 mg | ORAL_TABLET | Freq: Every day | ORAL | 5 refills | Status: DC

## 2016-03-16 MED ORDER — METFORMIN HCL 1000 MG PO TABS: 1000.0000 mg | ORAL_TABLET | Freq: Two times a day (BID) | ORAL | 5 refills | Status: DC

## 2016-03-16 MED ORDER — PERMETHRIN 5 % EX CREA: 1.0000 "application " | TOPICAL_CREAM | Freq: Once | CUTANEOUS | 1 refills | Status: AC

## 2016-03-16 MED ORDER — HYDROCHLOROTHIAZIDE 25 MG PO TABS: 25.0000 mg | ORAL_TABLET | Freq: Every day | ORAL | 5 refills | Status: DC

## 2016-03-16 NOTE — Progress Notes (Signed)
Subjective:  Patient ID: Haley Mendez, female    DOB: 1956/01/22  Age: 60 y.o. MRN: [id number]  CC: Rash (itching then it becomes welts); Cough; Nasal Congestion; and Diabetes   HPI Haley Mendez 60 year old female with a history of type 2 diabetes mellitus (A1c 8.1 from today which has trended up from 7.4 four months ago), hypertension, hyperlipidemia who presents today for a follow-up visit.  She has been compliant with her metformin and glipizide but has not been exercising and has gained 5 pounds in the last 4 months. She denies visual complaints or numbness in extremities.  Tolerating her antihypertensive with no complaints and also taking her statin as prescribed.  Complains of a 3 month history of pruritic rash which is worse at night which forms welps when she scratches at night. She lives alone; but did have a visit from her sister a couple of months ago. Also complains of a 2 to three-week history of cough with postnasal drainage, sinus congestion but no sinus pain, no fever or sore throat.  Past Medical History:  Diagnosis Date  . Arthritis   . Bone spur of other site    left rotator cuff  . Diabetes mellitus without complication ([place])   . History of colon polyps   . Hyperlipidemia   . Hypertension     History reviewed. No pertinent surgical history.  No Known Allergies   Outpatient Medications Prior to Visit  Medication Sig Dispense Refill  . glipiZIDE (GLUCOTROL XL) 5 MG 24 hr tablet Take 1 tablet (5 mg total) by mouth daily with breakfast. 30 tablet 5  . hydrochlorothiazide (HYDRODIURIL) 25 MG tablet Take 1 tablet (25 mg total) by mouth daily. 30 tablet 5  . lisinopril (PRINIVIL,ZESTRIL) 20 MG tablet TAKE 1 TABLET (20 MG TOTAL) BY MOUTH DAILY. 30 tablet 5  . metFORMIN (GLUCOPHAGE) 1000 MG tablet Take 1 tablet (1,000 mg total) by mouth 2 (two) times daily with a meal. 60 tablet 5  . simvastatin (ZOCOR) 40 MG tablet Take 1 tablet (40 mg total) by mouth at  bedtime. 30 tablet 5  . Blood Glucose Monitoring Suppl (ONE TOUCH ULTRA 2) w/Device KIT BS check BID for E11.9 (Patient not taking: Reported on 03/16/2016) 1 each 0  . glucose blood (ONE TOUCH TEST STRIPS) test strip 1 each by Other route daily. Use to check blood sugar daily Dx 250.00 (Patient not taking: Reported on 03/16/2016) 100 each 3  . ONETOUCH DELICA LANCETS FINE MISC 1 each by Does not apply route daily. Dx 250.00 (Patient not taking: Reported on 03/16/2016) 100 each 5   No facility-administered medications prior to visit.     ROS Review of Systems  Constitutional: Negative for activity change, appetite change and fatigue.  HENT: Negative for congestion.   Eyes: Negative for visual disturbance.  Respiratory: Negative for cough, chest tightness, shortness of breath and wheezing.   Cardiovascular: Negative for chest pain and palpitations.  Gastrointestinal: Negative for abdominal distention, abdominal pain and constipation.  Endocrine: Negative for polydipsia.  Genitourinary: Negative for dysuria and frequency.  Musculoskeletal: Negative for arthralgias and back pain.  Skin: Positive for rash.  Neurological: Negative for tremors, light-headedness and numbness.  Hematological: Does not bruise/bleed easily.  Psychiatric/Behavioral: Negative for agitation and behavioral problems.    Objective:  BP 97/64 (BP Location: Right Arm, Patient Position: Sitting, Cuff Size: Small)   Pulse 75   Temp 98.5 F (36.9 C) (Oral)   Ht 5' 2.75" (1.594 m)   Wt 141  lb 12.8 oz (64.3 kg)   SpO2 98%   BMI 25.32 kg/m   BP/Weight 03/16/2016 11/28/2015 95/10/4732  Systolic BP 97 037 096  Diastolic BP 64 84 75  Wt. (Lbs) 141.8 136.6 143.4  BMI 25.32 24.39 26.22      Physical Exam  Constitutional: She is oriented to person, place, and time. She appears well-developed and well-nourished.  HENT:  Right Ear: External ear normal.  Left Ear: External ear normal.  Mouth/Throat: Oropharynx is  clear and moist.  Cardiovascular: Normal rate, normal heart sounds and intact distal pulses.   No murmur heard. Pulmonary/Chest: Effort normal and breath sounds normal. She has no wheezes. She has no rales. She exhibits no tenderness.  Abdominal: Soft. Bowel sounds are normal. She exhibits no distension and no mass. There is no tenderness.  Musculoskeletal: Normal range of motion.  Neurological: She is alert and oriented to person, place, and time.  Skin: Rash (generalized rash and upper extremities, lower extremities anterior and posterior trunk. Pinpoint rash in the right extremities is erythematous and upper extremity and trunk rash are maculopapular) noted.     Lab Results  Component Value Date   HGBA1C 8.1 03/16/2016    CMP Latest Ref Rng & Units 12/02/2015 04/01/2015 07/13/2013  Glucose 65 - 99 mg/dL 102(H) 143(H) 62(L)  BUN 7 - 25 mg/dL _0 Creatinine 0.50 - 0.99 mg/dL 0.65 0.69 0.66  Sodium 135 - 146 mmol/L 139 141 139  Potassium 3.5 - 5.3 mmol/L 5.3 4.0 4.3  Chloride 98 - 110 mmol/L 102 110 109  CO2 20 - 31 mmol/L 23 21 18(L)  Calcium 8.6 - 10.4 mg/dL 9.2 9.9 9.5  Total Protein 6.1 - 8.1 g/dL 6.8 - 6.7  Total Bilirubin 0.2 - 1.2 mg/dL 0.3 - 0.3  Alkaline Phos 33 - 130 U/L 73 - 101  AST 10 - 35 U/L 14 - 18  ALT 6 - 29 U/L 11 - 14    Lipid Panel     Component Value Date/Time   CHOL 139 12/02/2015 0945   TRIG 91 12/02/2015 0945   HDL 49 12/02/2015 0945   CHOLHDL 2.8 12/02/2015 0945   VLDL 18 12/02/2015 0945   LDLCALC 72 12/02/2015 0945    Assessment & Plan:   1. Type 2 diabetes mellitus without complication, without long-term current use of insulin (HCC) Uncontrolled with A1c of 8.1 which has trended up from 7.4 four months ago Increase glipizide from 5 to 10 mg ADA diet, lifestyle modification - HgB A1c - Glucose (CBG) - COMPLETE METABOLIC PANEL WITH GFR - glipiZIDE (GLUCOTROL XL) 10 MG 24 hr tablet; Take 1 tablet (10 mg total) by mouth daily with  breakfast.  Dispense: 30 tablet; Refill: 5 - metFORMIN (GLUCOPHAGE) 1000 MG tablet; Take 1 tablet (1,000 mg total) by mouth 2 (two) times daily with a meal.  Dispense: 60 tablet; Refill: 5  2. Essential hypertension Controlled - lisinopril (PRINIVIL,ZESTRIL) 20 MG tablet; TAKE 1 TABLET (20 MG TOTAL) BY MOUTH DAILY.  Dispense: 30 tablet; Refill: 5 - hydrochlorothiazide (HYDRODIURIL) 25 MG tablet; Take 1 tablet (25 mg total) by mouth daily.  Dispense: 30 tablet; Refill: 5  3. Pure hypercholesterolemia Controlled - simvastatin (ZOCOR) 40 MG tablet; Take 1 tablet (40 mg total) by mouth at bedtime.  Dispense: 30 tablet; Refill: 5  4. Scabies Discussed care of bedding - permethrin (ELIMITE) 5 % cream; Apply 1 application topically once. Leave on for 8-14 hours and wash off after.  Dispense:  60 g; Refill: 1  5. Screening for viral disease - Hepatitis C antibody, reflex  6. Sinus congestion  Indication for antibiotic at this time Use OTC decongestants, increase fluid intake   Meds ordered this encounter  Medications  . permethrin (ELIMITE) 5 % cream    Sig: Apply 1 application topically once. Leave on for 8-14 hours and wash off after.    Dispense:  60 g    Refill:  1  . glipiZIDE (GLUCOTROL XL) 10 MG 24 hr tablet    Sig: Take 1 tablet (10 mg total) by mouth daily with breakfast.    Dispense:  30 tablet    Refill:  5    Discontinue previous dose  . simvastatin (ZOCOR) 40 MG tablet    Sig: Take 1 tablet (40 mg total) by mouth at bedtime.    Dispense:  30 tablet    Refill:  5  . metFORMIN (GLUCOPHAGE) 1000 MG tablet    Sig: Take 1 tablet (1,000 mg total) by mouth 2 (two) times daily with a meal.    Dispense:  60 tablet    Refill:  5  . lisinopril (PRINIVIL,ZESTRIL) 20 MG tablet    Sig: TAKE 1 TABLET (20 MG TOTAL) BY MOUTH DAILY.    Dispense:  30 tablet    Refill:  5  . hydrochlorothiazide (HYDRODIURIL) 25 MG tablet    Sig: Take 1 tablet (25 mg total) by mouth daily.     Dispense:  30 tablet    Refill:  5    Follow-up: Return in about 1 month (around 04/15/2016) for Follow-up on rash.   Arnoldo Morale MD

## 2016-03-17 LAB — HEPATITIS C ANTIBODY: HCV Ab: NEGATIVE

## 2016-03-19 ENCOUNTER — Telehealth: Payer: Self-pay

## 2016-03-19 NOTE — Telephone Encounter (Signed)
Writer called patient per Dr. Jarold Song and discussed lab results.  Patient stated understanding.

## 2016-03-19 NOTE — Telephone Encounter (Signed)
-----   Message from Arnoldo Morale, MD sent at 03/19/2016  8:42 AM EST ----- Labs are negative for Hep C, glucose is elevated. Maintain medication changes introduced at her last office visit for optimal glycemic control.

## 2016-09-04 ENCOUNTER — Telehealth: Payer: Self-pay | Admitting: Family Medicine

## 2016-09-04 NOTE — Telephone Encounter (Signed)
Please ask patients to specify which medications need refills. "ALL" is not appropriate in case some are antibiotics.

## 2016-09-04 NOTE — Telephone Encounter (Signed)
PT called she need a refill for all her medication since she almost out and she does not have any refill, please sent all the med to [facility] in [place], Please follow up

## 2016-10-04 ENCOUNTER — Other Ambulatory Visit: Payer: Self-pay | Admitting: Family Medicine

## 2016-10-04 DIAGNOSIS — I10 Essential (primary) hypertension: Secondary | ICD-10-CM

## 2016-10-17 ENCOUNTER — Other Ambulatory Visit: Payer: Self-pay | Admitting: Family Medicine

## 2016-10-17 DIAGNOSIS — Z1231 Encounter for screening mammogram for malignant neoplasm of breast: Secondary | ICD-10-CM

## 2016-10-19 ENCOUNTER — Encounter: Payer: Self-pay | Admitting: Family Medicine

## 2016-10-19 ENCOUNTER — Ambulatory Visit: Payer: BLUE CROSS/BLUE SHIELD | Attending: Family Medicine | Admitting: Family Medicine

## 2016-10-19 ENCOUNTER — Other Ambulatory Visit: Payer: Self-pay | Admitting: Pharmacist

## 2016-10-19 VITALS — BP 124/69 | HR 71 | Temp 98.4°F | Wt 140.8 lb

## 2016-10-19 DIAGNOSIS — I10 Essential (primary) hypertension: Secondary | ICD-10-CM | POA: Diagnosis not present

## 2016-10-19 DIAGNOSIS — M1990 Unspecified osteoarthritis, unspecified site: Secondary | ICD-10-CM | POA: Insufficient documentation

## 2016-10-19 DIAGNOSIS — Z86010 Personal history of colonic polyps: Secondary | ICD-10-CM | POA: Diagnosis not present

## 2016-10-19 DIAGNOSIS — E119 Type 2 diabetes mellitus without complications: Secondary | ICD-10-CM | POA: Insufficient documentation

## 2016-10-19 DIAGNOSIS — M546 Pain in thoracic spine: Secondary | ICD-10-CM | POA: Diagnosis not present

## 2016-10-19 DIAGNOSIS — E0800 Diabetes mellitus due to underlying condition with hyperosmolarity without nonketotic hyperglycemic-hyperosmolar coma (NKHHC): Secondary | ICD-10-CM

## 2016-10-19 DIAGNOSIS — Z1159 Encounter for screening for other viral diseases: Secondary | ICD-10-CM | POA: Diagnosis not present

## 2016-10-19 DIAGNOSIS — E7800 Pure hypercholesterolemia, unspecified: Secondary | ICD-10-CM | POA: Insufficient documentation

## 2016-10-19 DIAGNOSIS — Z7984 Long term (current) use of oral hypoglycemic drugs: Secondary | ICD-10-CM | POA: Diagnosis not present

## 2016-10-19 DIAGNOSIS — Z79899 Other long term (current) drug therapy: Secondary | ICD-10-CM | POA: Insufficient documentation

## 2016-10-19 LAB — POCT GLYCOSYLATED HEMOGLOBIN (HGB A1C): HEMOGLOBIN A1C: 8.1

## 2016-10-19 LAB — GLUCOSE, POCT (MANUAL RESULT ENTRY): POC Glucose: 85 mg/dl (ref 70–99)

## 2016-10-19 MED ORDER — GLUCOSE BLOOD VI STRP: 1.0000 | ORAL_STRIP | Freq: Every day | 3 refills | Status: DC

## 2016-10-19 MED ORDER — ONETOUCH ULTRA 2 W/DEVICE KIT: PACK | 0 refills | Status: DC

## 2016-10-19 MED ORDER — HYDROCHLOROTHIAZIDE 25 MG PO TABS: 25.0000 mg | ORAL_TABLET | Freq: Every day | ORAL | 5 refills | Status: DC

## 2016-10-19 MED ORDER — METFORMIN HCL 1000 MG PO TABS: 1000.0000 mg | ORAL_TABLET | Freq: Two times a day (BID) | ORAL | 5 refills | Status: DC

## 2016-10-19 MED ORDER — CYCLOBENZAPRINE HCL 10 MG PO TABS: 10.0000 mg | ORAL_TABLET | Freq: Three times a day (TID) | ORAL | 0 refills | Status: DC | PRN

## 2016-10-19 MED ORDER — GLIPIZIDE ER 10 MG PO TB24: 20.0000 mg | ORAL_TABLET | Freq: Every day | ORAL | 5 refills | Status: DC

## 2016-10-19 MED ORDER — ONETOUCH DELICA LANCETS FINE MISC: 1.0000 | Freq: Every day | 5 refills | Status: DC

## 2016-10-19 MED ORDER — SIMVASTATIN 40 MG PO TABS: 40.0000 mg | ORAL_TABLET | Freq: Every day | ORAL | 5 refills | Status: DC

## 2016-10-19 NOTE — Patient Instructions (Signed)
Diabetes Mellitus and Food It is important for you to manage your blood sugar (glucose) level. Your blood glucose level can be greatly affected by what you eat. Eating healthier foods in the appropriate amounts throughout the day at about the same time each day will help you control your blood glucose level. It can also help slow or prevent worsening of your diabetes mellitus. Healthy eating may even help you improve the level of your blood pressure and reach or maintain a healthy weight. General recommendations for healthful eating and cooking habits include:  Eating meals and snacks regularly. Avoid going long periods of time without eating to lose weight.  Eating a diet that consists mainly of plant-based foods, such as fruits, vegetables, nuts, legumes, and whole grains.  Using low-heat cooking methods, such as baking, instead of high-heat cooking methods, such as deep frying.  Work with your dietitian to make sure you understand how to use the Nutrition Facts information on food labels. How can food affect me? Carbohydrates Carbohydrates affect your blood glucose level more than any other type of food. Your dietitian will help you determine how many carbohydrates to eat at each meal and teach you how to count carbohydrates. Counting carbohydrates is important to keep your blood glucose at a healthy level, especially if you are using insulin or taking certain medicines for diabetes mellitus. Alcohol Alcohol can cause sudden decreases in blood glucose (hypoglycemia), especially if you use insulin or take certain medicines for diabetes mellitus. Hypoglycemia can be a life-threatening condition. Symptoms of hypoglycemia (sleepiness, dizziness, and disorientation) are similar to symptoms of having too much alcohol. If your health care provider has given you approval to drink alcohol, do so in moderation and use the following guidelines:  Women should not have more than one drink per day, and men  should not have more than two drinks per day. One drink is equal to: ? 12 oz of beer. ? 5 oz of wine. ? 1 oz of hard liquor.  Do not drink on an empty stomach.  Keep yourself hydrated. Have water, diet soda, or unsweetened iced tea.  Regular soda, juice, and other mixers might contain a lot of carbohydrates and should be counted.  What foods are not recommended? As you make food choices, it is important to remember that all foods are not the same. Some foods have fewer nutrients per serving than other foods, even though they might have the same number of calories or carbohydrates. It is difficult to get your body what it needs when you eat foods with fewer nutrients. Examples of foods that you should avoid that are high in calories and carbohydrates but low in nutrients include:  Trans fats (most processed foods list trans fats on the Nutrition Facts label).  Regular soda.  Juice.  Candy.  Sweets, such as cake, pie, doughnuts, and cookies.  Fried foods.  What foods can I eat? Eat nutrient-rich foods, which will nourish your body and keep you healthy. The food you should eat also will depend on several factors, including:  The calories you need.  The medicines you take.  Your weight.  Your blood glucose level.  Your blood pressure level.  Your cholesterol level.  You should eat a variety of foods, including:  Protein. ? Lean cuts of meat. ? Proteins low in saturated fats, such as fish, egg whites, and beans. Avoid processed meats.  Fruits and vegetables. ? Fruits and vegetables that may help control blood glucose levels, such as apples,   mangoes, and yams.  Dairy products. ? Choose fat-free or low-fat dairy products, such as milk, yogurt, and cheese.  Grains, bread, pasta, and rice. ? Choose whole grain products, such as multigrain bread, whole oats, and brown rice. These foods may help control blood pressure.  Fats. ? Foods containing healthful fats, such as  nuts, avocado, olive oil, canola oil, and fish.  Does everyone with diabetes mellitus have the same meal plan? Because every person with diabetes mellitus is different, there is not one meal plan that works for everyone. It is very important that you meet with a dietitian who will help you create a meal plan that is just right for you. This information is not intended to replace advice given to you by your health care provider. Make sure you discuss any questions you have with your health care provider. Document Released: 01/11/2005 Document Revised: 09/22/2015 Document Reviewed: 03/13/2013 [employer] Interactive Patient Education  2017 [employer]  

## 2016-10-19 NOTE — Progress Notes (Signed)
Subjective:  Patient ID: Haley Mendez, female    DOB: 08-19-1955  Age: 61 y.o. MRN: [id number]  CC: Diabetes   HPI Haley Mendez is a 61 year old female with a history of type 2 diabetes mellitus (A1c 8.1 from today), hypertension, hyperlipidemia who presents today for a follow-up visit.  She has been compliant with her metformin and glipizide but has not been exercising. Her A1c was 8.1, seven months ago and is 8.1 today. She denies visual complaints or numbness in extremities. Yet to see her ophthalmologist for an annual eye exam and promises to do so.  Tolerating her antihypertensive with no complaints and also taking her statin as prescribed.  She complains of right-sided thoracic back pain which has been intermittent for the last 1 week after she moved a load of books.  Pain sometimes radiates anteriorly but is absent at this time. She has not taken any OTC medications.  Past Medical History:  Diagnosis Date  . Arthritis   . Bone spur of other site    left rotator cuff  . Diabetes mellitus without complication ([place])   . History of colon polyps   . Hyperlipidemia   . Hypertension     No past surgical history on file.  No Known Allergies   Outpatient Medications Prior to Visit  Medication Sig Dispense Refill  . lisinopril (PRINIVIL,ZESTRIL) 20 MG tablet TAKE 1 TABLET (20 MG TOTAL) BY MOUTH DAILY. 30 tablet 5  . Blood Glucose Monitoring Suppl (ONE TOUCH ULTRA 2) w/Device KIT BS check BID for E11.9 1 each 0  . glipiZIDE (GLUCOTROL XL) 10 MG 24 hr tablet Take 1 tablet (10 mg total) by mouth daily with breakfast. 30 tablet 5  . glucose blood (ONE TOUCH TEST STRIPS) test strip 1 each by Other route daily. Use to check blood sugar daily Dx 250.00 100 each 3  . hydrochlorothiazide (HYDRODIURIL) 25 MG tablet TAKE 1 TABLET (25 MG TOTAL) BY MOUTH DAILY. 30 tablet 5  . metFORMIN (GLUCOPHAGE) 1000 MG tablet Take 1 tablet (1,000 mg total) by mouth 2 (two) times daily with a meal.  60 tablet 5  . ONETOUCH DELICA LANCETS FINE MISC 1 each by Does not apply route daily. Dx 250.00 100 each 5  . simvastatin (ZOCOR) 40 MG tablet Take 1 tablet (40 mg total) by mouth at bedtime. 30 tablet 5   No facility-administered medications prior to visit.     ROS Review of Systems  Constitutional: Negative for activity change, appetite change and fatigue.  HENT: Negative for congestion, sinus pressure and sore throat.   Eyes: Negative for visual disturbance.  Respiratory: Negative for cough, chest tightness, shortness of breath and wheezing.   Cardiovascular: Negative for chest pain and palpitations.  Gastrointestinal: Negative for abdominal distention, abdominal pain and constipation.  Endocrine: Negative for polydipsia.  Genitourinary: Negative for dysuria and frequency.  Musculoskeletal: Positive for back pain. Negative for arthralgias.  Skin: Negative for rash.  Neurological: Negative for tremors, light-headedness and numbness.  Hematological: Does not bruise/bleed easily.  Psychiatric/Behavioral: Negative for agitation and behavioral problems.    Objective:  BP 124/69   Pulse 71   Temp 98.4 F (36.9 C) (Oral)   Wt 140 lb 12.8 oz (63.9 kg)   SpO2 98%   BMI 25.14 kg/m   BP/Weight 10/19/2016 03/16/2016 2/63/3354  Systolic BP 562 97 563  Diastolic BP 69 64 84  Wt. (Lbs) 140.8 141.8 136.6  BMI 25.14 25.32 24.39      Physical Exam  Constitutional: She is oriented to person, place, and time. She appears well-developed and well-nourished.  Cardiovascular: Normal rate, normal heart sounds and intact distal pulses.   No murmur heard. Pulmonary/Chest: Effort normal and breath sounds normal. She has no wheezes. She has no rales. She exhibits no tenderness.  Abdominal: Soft. Bowel sounds are normal. She exhibits no distension and no mass. There is no tenderness.  Musculoskeletal: Normal range of motion. She exhibits no tenderness (no tenderness elicited on palpation of  entire back and on range of motion).  Neurological: She is alert and oriented to person, place, and time.  Skin: Skin is warm and dry.  Psychiatric: She has a normal mood and affect.    CMP Latest Ref Rng & Units 03/16/2016 12/02/2015 04/01/2015  Glucose 65 - 99 mg/dL 115(H) 102(H) 143(H)  BUN 7 - 25 mg/dL 16 16 22   Creatinine 0.50 - 0.99 mg/dL 0.80 0.65 0.69  Sodium 135 - 146 mmol/L 135 139 141  Potassium 3.5 - 5.3 mmol/L 4.1 5.3 4.0  Chloride 98 - 110 mmol/L 104 102 110  CO2 20 - 31 mmol/L 21 23 21   Calcium 8.6 - 10.4 mg/dL 9.9 9.2 9.9  Total Protein 6.1 - 8.1 g/dL 7.1 6.8 -  Total Bilirubin 0.2 - 1.2 mg/dL 0.2 0.3 -  Alkaline Phos 33 - 130 U/L 86 73 -  AST 10 - 35 U/L 16 14 -  ALT 6 - 29 U/L 14 11 -    Lipid Panel     Component Value Date/Time   CHOL 139 12/02/2015 0945   TRIG 91 12/02/2015 0945   HDL 49 12/02/2015 0945   CHOLHDL 2.8 12/02/2015 0945   VLDL 18 12/02/2015 0945   LDLCALC 72 12/02/2015 0945    Lab Results  Component Value Date   HGBA1C 8.1 10/19/2016    Assessment & Plan:   1. Type 2 diabetes mellitus without complication, without long-term current use of insulin (HCC) A1c is 8.1 which is above goal of less than 7 Increased dose of glipizide Diabetic diet - POCT glucose (manual entry) - POCT glycosylated hemoglobin (Hb A1C) - glipiZIDE (GLUCOTROL XL) 10 MG 24 hr tablet; Take 2 tablets (20 mg total) by mouth daily with breakfast.  Dispense: 60 tablet; Refill: 5 - metFORMIN (GLUCOPHAGE) 1000 MG tablet; Take 1 tablet (1,000 mg total) by mouth 2 (two) times daily with a meal.  Dispense: 60 tablet; Refill: 5 - ONETOUCH DELICA LANCETS FINE MISC; 1 each by Does not apply route daily. Dx 250.00  Dispense: 100 each; Refill: 5 - Blood Glucose Monitoring Suppl (ONE TOUCH ULTRA 2) w/Device KIT; BS check BID for E11.9  Dispense: 1 each; Refill: 0  2. Pure hypercholesterolemia Controlled Low-cholesterol diet - simvastatin (ZOCOR) 40 MG tablet; Take 1 tablet (40 mg  total) by mouth at bedtime.  Dispense: 30 tablet; Refill: 5  3. Essential hypertension Controlled - hydrochlorothiazide (HYDRODIURIL) 25 MG tablet; Take 1 tablet (25 mg total) by mouth daily.  Dispense: 30 tablet; Refill: 5 - CMP14+EGFR; Future - Lipid panel; Future  4. Acute right-sided thoracic back pain Could be due to muscle spasm Advised to apply heat - cyclobenzaprine (FLEXERIL) 10 MG tablet; Take 1 tablet (10 mg total) by mouth 3 (three) times daily as needed for muscle spasms.  Dispense: 30 tablet; Refill: 0  5. Screening for viral disease - HIV antibody (with reflex); Future   Meds ordered this encounter  Medications  . glipiZIDE (GLUCOTROL XL) 10 MG 24 hr tablet  Sig: Take 2 tablets (20 mg total) by mouth daily with breakfast.    Dispense:  60 tablet    Refill:  5    Discontinue previous dose  . simvastatin (ZOCOR) 40 MG tablet    Sig: Take 1 tablet (40 mg total) by mouth at bedtime.    Dispense:  30 tablet    Refill:  5  . hydrochlorothiazide (HYDRODIURIL) 25 MG tablet    Sig: Take 1 tablet (25 mg total) by mouth daily.    Dispense:  30 tablet    Refill:  5  . metFORMIN (GLUCOPHAGE) 1000 MG tablet    Sig: Take 1 tablet (1,000 mg total) by mouth 2 (two) times daily with a meal.    Dispense:  60 tablet    Refill:  5  . cyclobenzaprine (FLEXERIL) 10 MG tablet    Sig: Take 1 tablet (10 mg total) by mouth 3 (three) times daily as needed for muscle spasms.    Dispense:  30 tablet    Refill:  0  . ONETOUCH DELICA LANCETS FINE MISC    Sig: 1 each by Does not apply route daily. Dx 250.00    Dispense:  100 each    Refill:  5  . glucose blood (ONE TOUCH TEST STRIPS) test strip    Sig: 1 each by Other route daily. Use to check blood sugar daily Dx 250.00    Dispense:  100 each    Refill:  3  . Blood Glucose Monitoring Suppl (ONE TOUCH ULTRA 2) w/Device KIT    Sig: BS check BID for E11.9    Dispense:  1 each    Refill:  0    Give pt cheapest meter that insurance  covers    Follow-up: Return in about 1 month (around 11/18/2016) for Pap smear.   Arnoldo Morale MD

## 2016-10-23 ENCOUNTER — Other Ambulatory Visit: Payer: Self-pay | Admitting: Pharmacist

## 2016-10-23 MED ORDER — GLUCOSE BLOOD VI STRP: ORAL_STRIP | 3 refills | Status: DC

## 2016-10-26 ENCOUNTER — Ambulatory Visit
Admission: RE | Admit: 2016-10-26 | Discharge: 2016-10-26 | Disposition: A | Discharge status: Home / Self Care | Payer: BLUE CROSS/BLUE SHIELD | Source: Ambulatory Visit | Attending: Family Medicine | Admitting: Family Medicine

## 2016-10-26 DIAGNOSIS — Z1231 Encounter for screening mammogram for malignant neoplasm of breast: Secondary | ICD-10-CM

## 2016-11-06 ENCOUNTER — Encounter: Payer: Self-pay | Admitting: *Deleted

## 2016-11-12 ENCOUNTER — Ambulatory Visit (HOSPITAL_COMMUNITY)
Admission: EM | Admit: 2016-11-12 | Discharge: 2016-11-12 | Disposition: A | Discharge status: Home / Self Care | Payer: BLUE CROSS/BLUE SHIELD | Attending: Family Medicine | Admitting: Family Medicine

## 2016-11-12 ENCOUNTER — Encounter (HOSPITAL_COMMUNITY): Payer: Self-pay | Admitting: Emergency Medicine

## 2016-11-12 DIAGNOSIS — N3090 Cystitis, unspecified without hematuria: Secondary | ICD-10-CM | POA: Diagnosis not present

## 2016-11-12 LAB — POCT URINALYSIS DIP (DEVICE)
Bilirubin Urine: NEGATIVE
GLUCOSE, UA: NEGATIVE mg/dL
Ketones, ur: NEGATIVE mg/dL
NITRITE: NEGATIVE
PH: 5.5 (ref 5.0–8.0)
PROTEIN: NEGATIVE mg/dL
Specific Gravity, Urine: 1.015 (ref 1.005–1.030)
Urobilinogen, UA: 0.2 mg/dL (ref 0.0–1.0)

## 2016-11-12 MED ORDER — CEPHALEXIN 500 MG PO CAPS: 500.0000 mg | ORAL_CAPSULE | Freq: Two times a day (BID) | ORAL | 0 refills | Status: AC

## 2016-11-12 NOTE — ED Provider Notes (Signed)
CSN: [id number]     Arrival date & time 11/12/16  1011 History   None    Chief Complaint  Patient presents with  . Dysuria   (Consider location/radiation/quality/duration/timing/severity/associated sxs/prior Treatment) 61 year old female with past medical history of diabetes, hypertension, hyperlipidemia, comes in with a four-day history of dysuria, urinary frequency, lower abdominal pain. She states she also started having hematuria, and some low back pain. Has not taken anything. Has had some chills, night sweats. Denies fever, nausea, vomiting, diarrhea. Has a history of constipation, and has been having strenuous bowel movements. Denies vaginal discharge, itching/pain, spotting. Her diabetes is stable but uncontrolled, last A1c 8.      Past Medical History:  Diagnosis Date  . Arthritis   . Bone spur of other site    left rotator cuff  . Diabetes mellitus without complication ([place])   . History of colon polyps   . Hyperlipidemia   . Hypertension    History reviewed. No pertinent surgical history. Family History  Problem Relation Age of Onset  . Early death Mother   . Diabetes Father   . Hypertension Father   . Diabetes Brother   . Diabetes Brother    Social History  Substance Use Topics  . Smoking status: Current Every Day Smoker    Packs/day: 0.50    Years: 15.00    Types: Cigarettes  . Smokeless tobacco: Never Used  . Alcohol use 1.2 oz/week    2 Cans of beer per week     Comment: occasional   OB History    No data available     Review of Systems  Constitutional: Positive for chills and diaphoresis. Negative for fatigue and fever.  Gastrointestinal: Positive for abdominal pain (suprapubic) and constipation. Negative for blood in stool, diarrhea, nausea and vomiting.  Genitourinary: Positive for decreased urine volume, difficulty urinating, frequency, hematuria, pelvic pain and urgency. Negative for flank pain, vaginal bleeding, vaginal discharge and vaginal  pain.    Allergies  Patient has no known allergies.  Home Medications   Prior to Admission medications   Medication Sig Start Date End Date Taking? Authorizing Provider  Blood Glucose Monitoring Suppl (ONE TOUCH ULTRA 2) w/Device KIT BS check BID for E11.9 10/19/16   Arnoldo Morale, MD  cephALEXin (KEFLEX) 500 MG capsule Take 1 capsule (500 mg total) by mouth 2 (two) times daily. 11/12/16 11/17/16  Ok Edwards, PA-C  cyclobenzaprine (FLEXERIL) 10 MG tablet Take 1 tablet (10 mg total) by mouth 3 (three) times daily as needed for muscle spasms. 10/19/16   Arnoldo Morale, MD  glipiZIDE (GLUCOTROL XL) 10 MG 24 hr tablet Take 2 tablets (20 mg total) by mouth daily with breakfast. 10/19/16   Arnoldo Morale, MD  glucose blood (ONE TOUCH TEST STRIPS) test strip Use to check blood sugar daily Dx E11.9 10/23/16   Arnoldo Morale, MD  hydrochlorothiazide (HYDRODIURIL) 25 MG tablet Take 1 tablet (25 mg total) by mouth daily. 10/19/16   Arnoldo Morale, MD  lisinopril (PRINIVIL,ZESTRIL) 20 MG tablet TAKE 1 TABLET (20 MG TOTAL) BY MOUTH DAILY. 03/16/16   Arnoldo Morale, MD  metFORMIN (GLUCOPHAGE) 1000 MG tablet Take 1 tablet (1,000 mg total) by mouth 2 (two) times daily with a meal. 10/19/16   Arnoldo Morale, MD  [facility] DELICA LANCETS FINE MISC 1 each by Does not apply route daily. Dx 250.00 10/19/16   Arnoldo Morale, MD  simvastatin (ZOCOR) 40 MG tablet Take 1 tablet (40 mg total) by mouth at bedtime. 10/19/16  Arnoldo Morale, MD   Meds Ordered and Administered this Visit  Medications - No data to display  BP 131/76 (BP Location: Right Arm)   Pulse 91   Temp 98.5 F (36.9 C) (Oral)   Resp 18   SpO2 100%  No data found.   Physical Exam  Constitutional: She is oriented to person, place, and time. She appears well-developed and well-nourished. No distress.  HENT:  Head: Normocephalic and atraumatic.  Eyes: Pupils are equal, round, and reactive to light. Conjunctivae are normal.  Cardiovascular: Normal rate,  regular rhythm and normal heart sounds.  Exam reveals no gallop and no friction rub.   No murmur heard. Pulmonary/Chest: Effort normal and breath sounds normal. She has no wheezes. She has no rales.  Abdominal: Soft. Bowel sounds are normal. She exhibits no mass. There is no tenderness. There is no rebound, no guarding and no CVA tenderness.  Neurological: She is alert and oriented to person, place, and time.  Skin: Skin is warm and dry.  Psychiatric: She has a normal mood and affect. Her behavior is normal. Judgment normal.    Urgent Care Course     Procedures (including critical care time)  Labs Review Labs Reviewed  POCT URINALYSIS DIP (DEVICE) - Abnormal; Notable for the following:       Result Value   Hgb urine dipstick MODERATE (*)    Leukocytes, UA LARGE (*)    All other components within normal limits    Imaging Review No results found.        MDM   1. Cystitis    Discussed lab results with patient, urine dipstick positive for a urinary tract infection. Patient requested generic form of antibiotic, as well as the cheapest option available. Start Keflex 500 mg twice a day for 5 days. Written prescription given to patient. Patient to contact us if unable to fill prescription. Patient to monitor for worsening of symptoms, nausea, vomiting, fever, to follow up for reevaluation. Given patient's history of constipation, with recent symptoms, patient to take MiraLAX. Patient expresses understanding, and agrees to plan.  Ok Edwards, PA-C 11/12/16 1139    Cathlean Sauer V, PA-C 11/12/16 1140

## 2016-11-12 NOTE — ED Triage Notes (Signed)
The patient presented to the [facility] with a complaint of dysuria with lower abdominal and back pain x 4 days.

## 2016-11-16 ENCOUNTER — Encounter: Payer: Self-pay | Admitting: Family Medicine

## 2016-11-16 ENCOUNTER — Other Ambulatory Visit (HOSPITAL_COMMUNITY)
Admission: RE | Admit: 2016-11-16 | Discharge: 2016-11-16 | Disposition: A | Discharge status: Home / Self Care | Payer: BLUE CROSS/BLUE SHIELD | Source: Ambulatory Visit | Attending: Family Medicine | Admitting: Family Medicine

## 2016-11-16 ENCOUNTER — Ambulatory Visit: Payer: BLUE CROSS/BLUE SHIELD | Attending: Family Medicine | Admitting: Family Medicine

## 2016-11-16 VITALS — BP 111/81 | HR 77 | Temp 98.7°F | Resp 18 | Ht 63.0 in | Wt 138.0 lb

## 2016-11-16 DIAGNOSIS — Z7984 Long term (current) use of oral hypoglycemic drugs: Secondary | ICD-10-CM | POA: Diagnosis not present

## 2016-11-16 DIAGNOSIS — Z124 Encounter for screening for malignant neoplasm of cervix: Secondary | ICD-10-CM | POA: Diagnosis not present

## 2016-11-16 DIAGNOSIS — E119 Type 2 diabetes mellitus without complications: Secondary | ICD-10-CM | POA: Diagnosis not present

## 2016-11-16 DIAGNOSIS — I10 Essential (primary) hypertension: Secondary | ICD-10-CM | POA: Insufficient documentation

## 2016-11-16 DIAGNOSIS — Z01419 Encounter for gynecological examination (general) (routine) without abnormal findings: Secondary | ICD-10-CM | POA: Insufficient documentation

## 2016-11-16 DIAGNOSIS — E785 Hyperlipidemia, unspecified: Secondary | ICD-10-CM | POA: Insufficient documentation

## 2016-11-16 DIAGNOSIS — M1990 Unspecified osteoarthritis, unspecified site: Secondary | ICD-10-CM | POA: Insufficient documentation

## 2016-11-16 DIAGNOSIS — Z114 Encounter for screening for human immunodeficiency virus [HIV]: Secondary | ICD-10-CM

## 2016-11-16 MED ORDER — LISINOPRIL 20 MG PO TABS: ORAL_TABLET | ORAL | 5 refills | Status: DC

## 2016-11-16 NOTE — Progress Notes (Signed)
Subjective:  Patient ID: Haley Mendez, female    DOB: Feb 07, 1956  Age: 61 y.o. MRN: [id number]  CC: Gynecologic Exam   HPI Haley Mendez is a 61 year old female with a history of type 2 diabetes mellitus (A1c 8.1), hypertension, hyperlipidemia who presents For a Pap smear today. She would also like to be tested for STDs.  Past Medical History:  Diagnosis Date  . Arthritis   . Bone spur of other site    left rotator cuff  . Diabetes mellitus without complication ([place])   . History of colon polyps   . Hyperlipidemia   . Hypertension     No past surgical history on file.  No Known Allergies    Outpatient Medications Prior to Visit  Medication Sig Dispense Refill  . Blood Glucose Monitoring Suppl (ONE TOUCH ULTRA 2) w/Device KIT BS check BID for E11.9 1 each 0  . cephALEXin (KEFLEX) 500 MG capsule Take 1 capsule (500 mg total) by mouth 2 (two) times daily. 10 capsule 0  . cyclobenzaprine (FLEXERIL) 10 MG tablet Take 1 tablet (10 mg total) by mouth 3 (three) times daily as needed for muscle spasms. 30 tablet 0  . glipiZIDE (GLUCOTROL XL) 10 MG 24 hr tablet Take 2 tablets (20 mg total) by mouth daily with breakfast. 60 tablet 5  . glucose blood (ONE TOUCH TEST STRIPS) test strip Use to check blood sugar daily Dx E11.9 100 each 3  . hydrochlorothiazide (HYDRODIURIL) 25 MG tablet Take 1 tablet (25 mg total) by mouth daily. 30 tablet 5  . metFORMIN (GLUCOPHAGE) 1000 MG tablet Take 1 tablet (1,000 mg total) by mouth 2 (two) times daily with a meal. 60 tablet 5  . ONETOUCH DELICA LANCETS FINE MISC 1 each by Does not apply route daily. Dx 250.00 100 each 5  . simvastatin (ZOCOR) 40 MG tablet Take 1 tablet (40 mg total) by mouth at bedtime. 30 tablet 5  . lisinopril (PRINIVIL,ZESTRIL) 20 MG tablet TAKE 1 TABLET (20 MG TOTAL) BY MOUTH DAILY. 30 tablet 5   No facility-administered medications prior to visit.     ROS Review of Systems  Constitutional: Negative for activity change and  appetite change.  HENT: Negative for sinus pressure and sore throat.   Respiratory: Negative for chest tightness, shortness of breath and wheezing.   Cardiovascular: Negative for chest pain and palpitations.  Gastrointestinal: Negative for abdominal distention, abdominal pain and constipation.  Genitourinary: Negative.   Musculoskeletal: Negative.   Psychiatric/Behavioral: Negative for behavioral problems and dysphoric mood.    Objective:  BP 111/81 (BP Location: Left Arm, Patient Position: Sitting, Cuff Size: Normal)   Pulse 77   Temp 98.7 F (37.1 C) (Oral)   Resp 18   Ht 5' 3"  (1.6 m)   Wt 138 lb (62.6 kg)   SpO2 98%   BMI 24.45 kg/m   BP/Weight 11/16/2016 11/12/2016 3/79/0240  Systolic BP 973 532 992  Diastolic BP 81 76 69  Wt. (Lbs) 138 - 140.8  BMI 24.45 - 25.14     Physical Exam  Constitutional: She is oriented to person, place, and time. She appears well-developed and well-nourished.  Cardiovascular: Normal rate, normal heart sounds and intact distal pulses.   No murmur heard. Pulmonary/Chest: Effort normal and breath sounds normal. She has no wheezes. She has no rales. She exhibits no tenderness.  Abdominal: Soft. Bowel sounds are normal. She exhibits no distension and no mass. There is no tenderness.  Genitourinary:  Genitourinary Comments: External genitalia, vagina,  cervix, adnexa all normal  Musculoskeletal: Normal range of motion.  Neurological: She is alert and oriented to person, place, and time.     Assessment & Plan:   1. Essential hypertension Controlled - lisinopril (PRINIVIL,ZESTRIL) 20 MG tablet; TAKE 1 TABLET (20 MG TOTAL) BY MOUTH DAILY.  Dispense: 30 tablet; Refill: 5  2. Screening for cervical cancer - Cytology - PAP [place]  3. Screening for HIV (human immunodeficiency virus) - HIV antibody (with reflex)   Meds ordered this encounter  Medications  . lisinopril (PRINIVIL,ZESTRIL) 20 MG tablet    Sig: TAKE 1 TABLET (20 MG TOTAL) BY  MOUTH DAILY.    Dispense:  30 tablet    Refill:  5    Follow-up: Return in about 3 months (around 02/16/2017) for Follow-up on diabetes.   This note has been created with Surveyor, quantity. Any transcriptional errors are unintentional.     Arnoldo Morale MD

## 2016-11-17 LAB — HIV ANTIBODY (ROUTINE TESTING W REFLEX): HIV Screen 4th Generation wRfx: NONREACTIVE

## 2016-11-20 ENCOUNTER — Ambulatory Visit: Payer: BLUE CROSS/BLUE SHIELD | Attending: Family Medicine | Admitting: Family Medicine

## 2016-11-20 ENCOUNTER — Encounter: Payer: Self-pay | Admitting: Family Medicine

## 2016-11-20 ENCOUNTER — Other Ambulatory Visit: Payer: Self-pay | Admitting: Family Medicine

## 2016-11-20 VITALS — BP 115/76 | HR 86 | Temp 98.9°F | Resp 18 | Ht 63.0 in | Wt 134.4 lb

## 2016-11-20 DIAGNOSIS — Z7984 Long term (current) use of oral hypoglycemic drugs: Secondary | ICD-10-CM | POA: Diagnosis not present

## 2016-11-20 DIAGNOSIS — R1013 Epigastric pain: Secondary | ICD-10-CM | POA: Diagnosis not present

## 2016-11-20 DIAGNOSIS — Z79899 Other long term (current) drug therapy: Secondary | ICD-10-CM | POA: Insufficient documentation

## 2016-11-20 DIAGNOSIS — E0800 Diabetes mellitus due to underlying condition with hyperosmolarity without nonketotic hyperglycemic-hyperosmolar coma (NKHHC): Secondary | ICD-10-CM | POA: Diagnosis not present

## 2016-11-20 DIAGNOSIS — E119 Type 2 diabetes mellitus without complications: Secondary | ICD-10-CM | POA: Diagnosis present

## 2016-11-20 DIAGNOSIS — E870 Hyperosmolality and hypernatremia: Secondary | ICD-10-CM | POA: Insufficient documentation

## 2016-11-20 DIAGNOSIS — K219 Gastro-esophageal reflux disease without esophagitis: Secondary | ICD-10-CM | POA: Diagnosis not present

## 2016-11-20 DIAGNOSIS — Z86010 Personal history of colonic polyps: Secondary | ICD-10-CM | POA: Diagnosis not present

## 2016-11-20 DIAGNOSIS — K5900 Constipation, unspecified: Secondary | ICD-10-CM | POA: Insufficient documentation

## 2016-11-20 DIAGNOSIS — E785 Hyperlipidemia, unspecified: Secondary | ICD-10-CM | POA: Insufficient documentation

## 2016-11-20 DIAGNOSIS — R110 Nausea: Secondary | ICD-10-CM | POA: Insufficient documentation

## 2016-11-20 DIAGNOSIS — I10 Essential (primary) hypertension: Secondary | ICD-10-CM | POA: Insufficient documentation

## 2016-11-20 LAB — CYTOLOGY - PAP
Bacterial vaginitis: POSITIVE — AB
CANDIDA VAGINITIS: NEGATIVE
CHLAMYDIA, DNA PROBE: NEGATIVE
DIAGNOSIS: NEGATIVE
HPV: NOT DETECTED
NEISSERIA GONORRHEA: NEGATIVE
Trichomonas: NEGATIVE

## 2016-11-20 LAB — GLUCOSE, POCT (MANUAL RESULT ENTRY): POC Glucose: 256 mg/dl — AB (ref 70–99)

## 2016-11-20 MED ORDER — OMEPRAZOLE 40 MG PO CPDR: 40.0000 mg | DELAYED_RELEASE_CAPSULE | Freq: Every day | ORAL | 1 refills | Status: DC

## 2016-11-20 MED ORDER — METRONIDAZOLE 0.75 % VA GEL: 1.0000 | Freq: Every day | VAGINAL | 0 refills | Status: DC

## 2016-11-20 NOTE — Progress Notes (Signed)
Patient presented to the office and is aware of results.

## 2016-11-20 NOTE — Progress Notes (Signed)
Subjective:  Patient ID: Haley Mendez, female    DOB: 12/11/1955  Age: 61 y.o. MRN: [id number]  CC: Abdominal Pain   HPI Haley Mendez is a 61 year old female with a history of type 2 diabetes mellitus (A1c 8.1), hypertension, hyperlipidemia who presents today with a two-week history of epigastric pain which radiates to her back and is worse at night. Pain causes her to bend over. It is associated with occasional nausea and heartburn. She denied vomiting, denies diarrhea but is sometimes constipated  Past Medical History:  Diagnosis Date  . Arthritis   . Bone spur of other site    left rotator cuff  . Diabetes mellitus without complication ([place])   . History of colon polyps   . Hyperlipidemia   . Hypertension     No past surgical history on file.  No Known Allergies   Outpatient Medications Prior to Visit  Medication Sig Dispense Refill  . Blood Glucose Monitoring Suppl (ONE TOUCH ULTRA 2) w/Device KIT BS check BID for E11.9 1 each 0  . glipiZIDE (GLUCOTROL XL) 10 MG 24 hr tablet Take 2 tablets (20 mg total) by mouth daily with breakfast. 60 tablet 5  . glucose blood (ONE TOUCH TEST STRIPS) test strip Use to check blood sugar daily Dx E11.9 100 each 3  . hydrochlorothiazide (HYDRODIURIL) 25 MG tablet Take 1 tablet (25 mg total) by mouth daily. 30 tablet 5  . lisinopril (PRINIVIL,ZESTRIL) 20 MG tablet TAKE 1 TABLET (20 MG TOTAL) BY MOUTH DAILY. 30 tablet 5  . metFORMIN (GLUCOPHAGE) 1000 MG tablet Take 1 tablet (1,000 mg total) by mouth 2 (two) times daily with a meal. 60 tablet 5  . ONETOUCH DELICA LANCETS FINE MISC 1 each by Does not apply route daily. Dx 250.00 100 each 5  . simvastatin (ZOCOR) 40 MG tablet Take 1 tablet (40 mg total) by mouth at bedtime. 30 tablet 5  . cyclobenzaprine (FLEXERIL) 10 MG tablet Take 1 tablet (10 mg total) by mouth 3 (three) times daily as needed for muscle spasms. (Patient not taking: Reported on 11/20/2016) 30 tablet 0   No  facility-administered medications prior to visit.     ROS Review of Systems  Constitutional: Negative for activity change and appetite change.  HENT: Negative for sinus pressure and sore throat.   Respiratory: Negative for chest tightness, shortness of breath and wheezing.   Cardiovascular: Negative for chest pain and palpitations.  Gastrointestinal: Positive for abdominal pain, constipation and nausea. Negative for abdominal distention, blood in stool, rectal pain and vomiting.  Genitourinary: Negative.   Musculoskeletal: Negative.   Psychiatric/Behavioral: Negative for behavioral problems and dysphoric mood.    Objective:  BP 115/76 (BP Location: Left Arm, Patient Position: Sitting, Cuff Size: Normal)   Pulse 86   Temp 98.9 F (37.2 C) (Oral)   Resp 18   Ht 5' 3"  (1.6 m)   Wt 134 lb 6.4 oz (61 kg)   SpO2 98%   BMI 23.81 kg/m   BP/Weight 11/20/2016 11/16/2016 8/88/7579  Systolic BP 728 206 015  Diastolic BP 76 81 76  Wt. (Lbs) 134.4 138 -  BMI 23.81 24.45 -      Physical Exam  Constitutional: She is oriented to person, place, and time. She appears well-developed and well-nourished.  Cardiovascular: Normal rate, normal heart sounds and intact distal pulses.   No murmur heard. Pulmonary/Chest: Effort normal and breath sounds normal. She has no wheezes. She has no rales. She exhibits no tenderness.  Abdominal: Soft.  Bowel sounds are normal. She exhibits no distension and no mass. There is tenderness (slight epigastric tenderness).  Musculoskeletal: Normal range of motion.  Neurological: She is alert and oriented to person, place, and time.     Lab Results  Component Value Date   HGBA1C 8.1 10/19/2016    Assessment & Plan:   1. Diabetes mellitus due to underlying condition with hyperosmolarity without coma, without long-term current use of insulin (HCC) A1c of 8.1 Continue current regimen - Glucose (CBG)  2. Epigastric pain Will need to exclude H. pylori  gastritis versus peptic ulcer disease - H. pylori breath test  3. Gastroesophageal reflux disease without esophagitis Avoid late meals - omeprazole (PRILOSEC) 40 MG capsule; Take 1 capsule (40 mg total) by mouth daily.  Dispense: 30 capsule; Refill: 1   Meds ordered this encounter  Medications  . omeprazole (PRILOSEC) 40 MG capsule    Sig: Take 1 capsule (40 mg total) by mouth daily.    Dispense:  30 capsule    Refill:  1    Follow-up: Return if symptoms worsen or fail to improve, for Follow-up of chronic medical conditions, keep previously scheduled appointment.   This note has been created with Surveyor, quantity. Any transcriptional errors are unintentional.     Arnoldo Morale MD

## 2016-11-20 NOTE — Patient Instructions (Signed)
Heartburn Heartburn is a type of pain or discomfort that can happen in the throat or chest. It is often described as a burning pain. It may also cause a bad taste in the mouth. Heartburn may feel worse when you lie down or bend over. It may be caused by stomach contents that move back up (reflux) into the tube that connects the mouth with the stomach (esophagus). Follow these instructions at home: Take these actions to lessen your discomfort and to help avoid problems. Diet  Follow a diet as told by your doctor. You may need to avoid foods and drinks such as: ? Coffee and tea (with or without caffeine). ? Drinks that contain alcohol. ? Energy drinks and sports drinks. ? Carbonated drinks or sodas. ? Chocolate and cocoa. ? Peppermint and mint flavorings. ? Garlic and onions. ? Horseradish. ? Spicy and acidic foods, such as peppers, chili powder, curry powder, vinegar, hot sauces, and BBQ sauce. ? Citrus fruit juices and citrus fruits, such as oranges, lemons, and limes. ? Tomato-based foods, such as red sauce, chili, salsa, and pizza with red sauce. ? Fried and fatty foods, such as donuts, french fries, potato chips, and high-fat dressings. ? High-fat meats, such as hot dogs, rib eye steak, sausage, ham, and bacon. ? High-fat dairy items, such as whole milk, butter, and cream cheese.  Eat small meals often. Avoid eating large meals.  Avoid drinking large amounts of liquid with your meals.  Avoid eating meals during the 2-3 hours before bedtime.  Avoid lying down right after you eat.  Do not exercise right after you eat. General instructions  Pay attention to any changes in your symptoms.  Take over-the-counter and prescription medicines only as told by your doctor. Do not take aspirin, ibuprofen, or other NSAIDs unless your doctor says it is okay.  Do not use any tobacco products, including cigarettes, chewing tobacco, and e-cigarettes. If you need help quitting, ask your  doctor.  Wear loose clothes. Do not wear anything tight around your waist.  Raise (elevate) the head of your bed about 6 inches (15 cm).  Try to lower your stress. If you need help doing this, ask your doctor.  If you are overweight, lose an amount of weight that is healthy for you. Ask your doctor about a safe weight loss goal.  Keep all follow-up visits as told by your doctor. This is important. Contact a doctor if:  You have new symptoms.  You lose weight and you do not know why it is happening.  You have trouble swallowing, or it hurts to swallow.  You have wheezing or a cough that keeps happening.  Your symptoms do not get better with treatment.  You have heartburn often for more than two weeks. Get help right away if:  You have pain in your arms, neck, jaw, teeth, or back.  You feel sweaty, dizzy, or light-headed.  You have chest pain or shortness of breath.  You throw up (vomit) and your throw up looks like blood or coffee grounds.  Your poop (stool) is bloody or black. This information is not intended to replace advice given to you by your health care provider. Make sure you discuss any questions you have with your health care provider. Document Released: 12/27/2010 Document Revised: 09/22/2015 Document Reviewed: 08/11/2014 Elsevier Interactive Patient Education  2018 Elsevier Inc.  

## 2016-11-20 NOTE — Progress Notes (Signed)
Patient has had toast  Patient has not had medication

## 2016-11-21 ENCOUNTER — Telehealth: Payer: Self-pay | Admitting: Family Medicine

## 2016-11-21 NOTE — Telephone Encounter (Signed)
Pt. Called requesting lab and pap results. Please f/u with pt.

## 2016-11-22 ENCOUNTER — Telehealth: Payer: Self-pay | Admitting: *Deleted

## 2016-11-22 LAB — H. PYLORI BREATH TEST: H pylori Breath Test: NEGATIVE

## 2016-11-22 LAB — CERVICOVAGINAL ANCILLARY ONLY: Herpes: NEGATIVE

## 2016-11-22 MED ORDER — METRONIDAZOLE 0.75 % VA GEL: 1.0000 | Freq: Every day | VAGINAL | 0 refills | Status: DC

## 2016-11-22 NOTE — Telephone Encounter (Signed)
-----   Message from Arnoldo Morale, MD sent at 11/22/2016  1:28 PM EDT ----- Breath test is negative for H. pylori

## 2016-11-22 NOTE — Telephone Encounter (Signed)
Patient verified DOB Patient is aware of BV being present in PAP.  Malignancy is negative. Patient is aware of metrogel being sent to the pharmacy. Patient requested medication [facility] to [facility]. Patient is aware of H. Pylori not being resulted from the lab just yet. Patient will receive another FU call regarding that result. No further questions at this time.

## 2016-11-22 NOTE — Telephone Encounter (Signed)
Patient is aware of h pylori being negative as well herpes being negative. Medical Assistant left message on patient's home and cell voicemail. Voicemail states to give a call back to [place] with [facility] at [phone].

## 2017-01-14 ENCOUNTER — Other Ambulatory Visit: Payer: Self-pay | Admitting: Family Medicine

## 2017-01-14 DIAGNOSIS — K219 Gastro-esophageal reflux disease without esophagitis: Secondary | ICD-10-CM

## 2017-02-09 ENCOUNTER — Other Ambulatory Visit: Payer: Self-pay | Admitting: Family Medicine

## 2017-02-09 DIAGNOSIS — K219 Gastro-esophageal reflux disease without esophagitis: Secondary | ICD-10-CM

## 2017-03-12 ENCOUNTER — Other Ambulatory Visit: Payer: Self-pay | Admitting: Family Medicine

## 2017-03-12 DIAGNOSIS — K219 Gastro-esophageal reflux disease without esophagitis: Secondary | ICD-10-CM

## 2017-04-15 ENCOUNTER — Ambulatory Visit: Payer: BLUE CROSS/BLUE SHIELD | Admitting: Family Medicine

## 2017-04-24 ENCOUNTER — Telehealth: Payer: Self-pay | Admitting: Pharmacist

## 2017-04-24 DIAGNOSIS — E119 Type 2 diabetes mellitus without complications: Secondary | ICD-10-CM

## 2017-04-24 DIAGNOSIS — I10 Essential (primary) hypertension: Secondary | ICD-10-CM

## 2017-04-24 DIAGNOSIS — E7800 Pure hypercholesterolemia, unspecified: Secondary | ICD-10-CM

## 2017-04-24 MED ORDER — METFORMIN HCL 1000 MG PO TABS: 1000.0000 mg | ORAL_TABLET | Freq: Two times a day (BID) | ORAL | 0 refills | Status: DC

## 2017-04-24 MED ORDER — SIMVASTATIN 40 MG PO TABS: 40.0000 mg | ORAL_TABLET | Freq: Every day | ORAL | 0 refills | Status: DC

## 2017-04-24 MED ORDER — LISINOPRIL 20 MG PO TABS: ORAL_TABLET | ORAL | 0 refills | Status: DC

## 2017-04-24 NOTE — Telephone Encounter (Signed)
Received fax from [facility] for refills. Refilled requested medications x 30 days, patient needs office visit for further refills.

## 2017-04-26 ENCOUNTER — Other Ambulatory Visit: Payer: Self-pay | Admitting: Pharmacist

## 2017-04-26 DIAGNOSIS — E119 Type 2 diabetes mellitus without complications: Secondary | ICD-10-CM

## 2017-04-26 MED ORDER — GLIPIZIDE ER 10 MG PO TB24: 20.0000 mg | ORAL_TABLET | Freq: Every day | ORAL | 0 refills | Status: DC

## 2017-05-23 ENCOUNTER — Other Ambulatory Visit: Payer: Self-pay | Admitting: Pharmacist

## 2017-05-23 DIAGNOSIS — E7800 Pure hypercholesterolemia, unspecified: Secondary | ICD-10-CM

## 2017-05-23 MED ORDER — SIMVASTATIN 40 MG PO TABS: 40.0000 mg | ORAL_TABLET | Freq: Every day | ORAL | 0 refills | Status: DC

## 2017-05-29 ENCOUNTER — Ambulatory Visit: Payer: BLUE CROSS/BLUE SHIELD | Attending: Family Medicine | Admitting: Family Medicine

## 2017-05-29 ENCOUNTER — Encounter: Payer: Self-pay | Admitting: Family Medicine

## 2017-05-29 VITALS — BP 113/69 | HR 65 | Temp 98.1°F | Ht 63.0 in | Wt 142.4 lb

## 2017-05-29 DIAGNOSIS — Z7984 Long term (current) use of oral hypoglycemic drugs: Secondary | ICD-10-CM | POA: Diagnosis not present

## 2017-05-29 DIAGNOSIS — Z86010 Personal history of colonic polyps: Secondary | ICD-10-CM | POA: Diagnosis not present

## 2017-05-29 DIAGNOSIS — M779 Enthesopathy, unspecified: Secondary | ICD-10-CM | POA: Diagnosis not present

## 2017-05-29 DIAGNOSIS — E7800 Pure hypercholesterolemia, unspecified: Secondary | ICD-10-CM | POA: Diagnosis not present

## 2017-05-29 DIAGNOSIS — Z23 Encounter for immunization: Secondary | ICD-10-CM

## 2017-05-29 DIAGNOSIS — F17210 Nicotine dependence, cigarettes, uncomplicated: Secondary | ICD-10-CM | POA: Diagnosis not present

## 2017-05-29 DIAGNOSIS — Z79899 Other long term (current) drug therapy: Secondary | ICD-10-CM | POA: Diagnosis not present

## 2017-05-29 DIAGNOSIS — E785 Hyperlipidemia, unspecified: Secondary | ICD-10-CM | POA: Insufficient documentation

## 2017-05-29 DIAGNOSIS — F17200 Nicotine dependence, unspecified, uncomplicated: Secondary | ICD-10-CM | POA: Diagnosis not present

## 2017-05-29 DIAGNOSIS — E119 Type 2 diabetes mellitus without complications: Secondary | ICD-10-CM

## 2017-05-29 DIAGNOSIS — I10 Essential (primary) hypertension: Secondary | ICD-10-CM

## 2017-05-29 LAB — GLUCOSE, POCT (MANUAL RESULT ENTRY): POC GLUCOSE: 130 mg/dL — AB (ref 70–99)

## 2017-05-29 LAB — POCT GLYCOSYLATED HEMOGLOBIN (HGB A1C): HEMOGLOBIN A1C: 8.3

## 2017-05-29 MED ORDER — LISINOPRIL-HYDROCHLOROTHIAZIDE 20-25 MG PO TABS
1.0000 | ORAL_TABLET | Freq: Every day | ORAL | 6 refills | Status: DC
Start: 2017-05-29 — End: 2017-11-18

## 2017-05-29 MED ORDER — BUPROPION HCL ER (SR) 150 MG PO TB12
150.0000 mg | ORAL_TABLET | Freq: Two times a day (BID) | ORAL | 3 refills | Status: DC
Start: 2017-05-29 — End: 2018-07-10

## 2017-05-29 MED ORDER — ZOSTER VAC RECOMB ADJUVANTED 50 MCG/0.5ML IM SUSR: 0.5000 mL | Freq: Once | INTRAMUSCULAR | 0 refills | Status: AC

## 2017-05-29 MED ORDER — CANAGLIFLOZIN 100 MG PO TABS: 100.0000 mg | ORAL_TABLET | Freq: Every day | ORAL | 6 refills | Status: DC

## 2017-05-29 MED ORDER — GLIPIZIDE ER 10 MG PO TB24: 20.0000 mg | ORAL_TABLET | Freq: Every day | ORAL | 6 refills | Status: DC

## 2017-05-29 MED ORDER — SIMVASTATIN 40 MG PO TABS: 40.0000 mg | ORAL_TABLET | Freq: Every day | ORAL | 6 refills | Status: DC

## 2017-05-29 MED ORDER — METFORMIN HCL 1000 MG PO TABS: 1000.0000 mg | ORAL_TABLET | Freq: Two times a day (BID) | ORAL | 6 refills | Status: DC

## 2017-05-29 MED FILL — SHINGRIX VIAL KIT: 50 | 1 days supply | Qty: 1 | Fill #0

## 2017-05-29 NOTE — Progress Notes (Signed)
Subjective:  Patient ID: Haley Mendez, female    DOB: Aug 24, 1955  Age: 62 y.o. MRN: [id number]  CC: Diabetes   HPI Haley Mendez is a 62 year old female with a history of type 2 diabetes mellitus (A1c 8.3), hypertension, hyperlipidemia who presents today for a follow-up visit.   A1c is 8.3 and has trended up from 8.1 previously but she endorses compliance with her medications.  She denies visual concerns or numbness in extremities and has not had any hypoglycemic episodes.  She does not exercise regularly.  With regards to her hypertension she has been compliant with her antihypertensive and denies any adverse effects. She tolerates her statin and denies myalgias. She has no chest pain, no shortness of breath.  She continues to smoke 10 cigarettes a day and has tried quitting but failed.  She is open to commencing Wellbutrin to help with smoking cessation.  Past Medical History:  Diagnosis Date  . Arthritis   . Bone spur of other site    left rotator cuff  . Diabetes mellitus without complication ([place])   . History of colon polyps   . Hyperlipidemia   . Hypertension     No past surgical history on file.  No Known Allergies   Outpatient Medications Prior to Visit  Medication Sig Dispense Refill  . glucose blood (ONE TOUCH TEST STRIPS) test strip Use to check blood sugar daily Dx E11.9 100 each 3  . ONETOUCH DELICA LANCETS FINE MISC 1 each by Does not apply route daily. Dx 250.00 100 each 5  . glipiZIDE (GLUCOTROL XL) 10 MG 24 hr tablet Take 2 tablets (20 mg total) by mouth daily with breakfast. 60 tablet 0  . hydrochlorothiazide (HYDRODIURIL) 25 MG tablet Take 1 tablet (25 mg total) by mouth daily. 30 tablet 5  . lisinopril (PRINIVIL,ZESTRIL) 20 MG tablet TAKE 1 TABLET (20 MG TOTAL) BY MOUTH DAILY. 30 tablet 0  . metFORMIN (GLUCOPHAGE) 1000 MG tablet Take 1 tablet (1,000 mg total) by mouth 2 (two) times daily with a meal. 60 tablet 0  . simvastatin (ZOCOR) 40 MG tablet  Take 1 tablet (40 mg total) by mouth at bedtime. 30 tablet 0  . Blood Glucose Monitoring Suppl (ONE TOUCH ULTRA 2) w/Device KIT BS check BID for E11.9 1 each 0  . omeprazole (PRILOSEC) 40 MG capsule TAKE 1 CAPSULE BY MOUTH EVERY DAY (Patient not taking: Reported on 05/29/2017) 30 capsule 0  . cyclobenzaprine (FLEXERIL) 10 MG tablet Take 1 tablet (10 mg total) by mouth 3 (three) times daily as needed for muscle spasms. (Patient not taking: Reported on 11/20/2016) 30 tablet 0  . metroNIDAZOLE (METROGEL VAGINAL) 0.75 % vaginal gel Place 1 Applicatorful vaginally at bedtime. (Patient not taking: Reported on 05/29/2017) 70 g 0   No facility-administered medications prior to visit.     ROS Review of Systems  Constitutional: Negative for activity change, appetite change and fatigue.  HENT: Negative for congestion, sinus pressure and sore throat.   Eyes: Negative for visual disturbance.  Respiratory: Negative for cough, chest tightness, shortness of breath and wheezing.   Cardiovascular: Negative for chest pain and palpitations.  Gastrointestinal: Negative for abdominal distention, abdominal pain and constipation.  Endocrine: Negative for polydipsia.  Genitourinary: Negative for dysuria and frequency.  Musculoskeletal: Negative for arthralgias and back pain.  Skin: Negative for rash.  Neurological: Negative for tremors, light-headedness and numbness.  Hematological: Does not bruise/bleed easily.  Psychiatric/Behavioral: Negative for agitation and behavioral problems.    Objective:  BP  113/69   Pulse 65   Temp 98.1 F (36.7 C) (Oral)   Ht 5' 3"  (1.6 m)   Wt 142 lb 6.4 oz (64.6 kg)   SpO2 98%   BMI 25.23 kg/m   BP/Weight 05/29/2017 11/20/2016 4/70/9628  Systolic BP 366 294 765  Diastolic BP 69 76 81  Wt. (Lbs) 142.4 134.4 138  BMI 25.23 23.81 24.45      Physical Exam  Constitutional: She is oriented to person, place, and time. She appears well-developed and well-nourished.    Cardiovascular: Normal rate, normal heart sounds and intact distal pulses.  No murmur heard. Pulmonary/Chest: Effort normal and breath sounds normal. She has no wheezes. She has no rales. She exhibits no tenderness.  Abdominal: Soft. Bowel sounds are normal. She exhibits no distension and no mass. There is no tenderness.  Musculoskeletal: Normal range of motion.  Neurological: She is alert and oriented to person, place, and time.  Skin: Skin is warm and dry.  Psychiatric: She has a normal mood and affect.     CMP Latest Ref Rng & Units 03/16/2016 12/02/2015 04/01/2015  Glucose 65 - 99 mg/dL 115(H) 102(H) 143(H)  BUN 7 - 25 mg/dL 16 16 22   Creatinine 0.50 - 0.99 mg/dL 0.80 0.65 0.69  Sodium 135 - 146 mmol/L 135 139 141  Potassium 3.5 - 5.3 mmol/L 4.1 5.3 4.0  Chloride 98 - 110 mmol/L 104 102 110  CO2 20 - 31 mmol/L 21 23 21   Calcium 8.6 - 10.4 mg/dL 9.9 9.2 9.9  Total Protein 6.1 - 8.1 g/dL 7.1 6.8 -  Total Bilirubin 0.2 - 1.2 mg/dL 0.2 0.3 -  Alkaline Phos 33 - 130 U/L 86 73 -  AST 10 - 35 U/L 16 14 -  ALT 6 - 29 U/L 14 11 -    Lipid Panel     Component Value Date/Time   CHOL 139 12/02/2015 0945   TRIG 91 12/02/2015 0945   HDL 49 12/02/2015 0945   CHOLHDL 2.8 12/02/2015 0945   VLDL 18 12/02/2015 0945   LDLCALC 72 12/02/2015 0945    Lab Results  Component Value Date   HGBA1C 8.3 05/29/2017    Assessment & Plan:   1. Pure hypercholesterolemia Controlled Low-cholesterol diet - simvastatin (ZOCOR) 40 MG tablet; Take 1 tablet (40 mg total) by mouth at bedtime.  Dispense: 30 tablet; Refill: 6  2. Type 2 diabetes mellitus without complication, without long-term current use of insulin (HCC) Uncontrolled with A1c of 8.3 Invokana added to regimen Counseled on Diabetic diet, my plate method, 465 minutes of moderate intensity exercise/week Keep blood sugar logs with fasting goals of 80-120 mg/dl, random of less than 180 and in the event of sugars less than 60 mg/dl or greater  than 400 mg/dl please notify the clinic ASAP. It is recommended that you undergo annual eye exams and annual foot exams - she plans to call her ophthalmologist to set up an appointment for her annual eye exam. Pneumonia vaccine is recommended. - POCT glucose (manual entry) - POCT glycosylated hemoglobin (Hb A1C) - canagliflozin (INVOKANA) 100 MG TABS tablet; Take 1 tablet (100 mg total) by mouth daily before breakfast.  Dispense: 30 tablet; Refill: 6 - CMP14+EGFR - Lipid panel - Microalbumin/Creatinine Ratio, Urine - metFORMIN (GLUCOPHAGE) 1000 MG tablet; Take 1 tablet (1,000 mg total) by mouth 2 (two) times daily with a meal.  Dispense: 60 tablet; Refill: 6 - glipiZIDE (GLUCOTROL XL) 10 MG 24 hr tablet; Take 2 tablets (20 mg  total) by mouth daily with breakfast.  Dispense: 60 tablet; Refill: 6  3. Essential hypertension Controlled Counseled on blood pressure goal of less than 130/80, low-sodium, DASH diet, medication compliance, 150 minutes of moderate intensity exercise per week. Discussed medication compliance, adverse effects. - lisinopril-hydrochlorothiazide (PRINZIDE,ZESTORETIC) 20-25 MG tablet; Take 1 tablet by mouth daily.  Dispense: 30 tablet; Refill: 6  4. TOBACCO ABUSE Smoking cessation support: smoking cessation hotline: 1-800-QUIT-NOW.  Smoking cessation classes are available through [facility]. Call [phone] or visit our website at [email or web address]  Spent 3 minutes counseling on dangers of tobacco use and benefits of quitting and patient is ready to quit.  - buPROPion (WELLBUTRIN SR) 150 MG 12 hr tablet; Take 1 tablet (150 mg total) by mouth 2 (two) times daily.  Dispense: 60 tablet; Refill: 3   Meds ordered this encounter  Medications  . simvastatin (ZOCOR) 40 MG tablet    Sig: Take 1 tablet (40 mg total) by mouth at bedtime.    Dispense:  30 tablet    Refill:  6  . canagliflozin (INVOKANA) 100 MG TABS tablet    Sig: Take 1 tablet  (100 mg total) by mouth daily before breakfast.    Dispense:  30 tablet    Refill:  6  . lisinopril-hydrochlorothiazide (PRINZIDE,ZESTORETIC) 20-25 MG tablet    Sig: Take 1 tablet by mouth daily.    Dispense:  30 tablet    Refill:  6    Discontinue lisinopril, discontinue HCTZ  . buPROPion (WELLBUTRIN SR) 150 MG 12 hr tablet    Sig: Take 1 tablet (150 mg total) by mouth 2 (two) times daily.    Dispense:  60 tablet    Refill:  3  . metFORMIN (GLUCOPHAGE) 1000 MG tablet    Sig: Take 1 tablet (1,000 mg total) by mouth 2 (two) times daily with a meal.    Dispense:  60 tablet    Refill:  6  . glipiZIDE (GLUCOTROL XL) 10 MG 24 hr tablet    Sig: Take 2 tablets (20 mg total) by mouth daily with breakfast.    Dispense:  60 tablet    Refill:  6    Follow-up: Return in about 6 months (around 11/26/2017) for Follow-up on diabetes mellitus.   Charlott Rakes MD

## 2017-05-29 NOTE — Patient Instructions (Signed)
Steps to Quit Smoking Smoking tobacco can be bad for your health. It can also affect almost every organ in your body. Smoking puts you and people around you at risk for many serious long-lasting (chronic) diseases. Quitting smoking is hard, but it is one of the best things that you can do for your health. It is never too late to quit. What are the benefits of quitting smoking? When you quit smoking, you lower your risk for getting serious diseases and conditions. They can include:  Lung cancer or lung disease.  Heart disease.  Stroke.  Heart attack.  Not being able to have children (infertility).  Weak bones (osteoporosis) and broken bones (fractures).  If you have coughing, wheezing, and shortness of breath, those symptoms may get better when you quit. You may also get sick less often. If you are pregnant, quitting smoking can help to lower your chances of having a baby of low birth weight. What can I do to help me quit smoking? Talk with your doctor about what can help you quit smoking. Some things you can do (strategies) include:  Quitting smoking totally, instead of slowly cutting back how much you smoke over a period of time.  Going to in-person counseling. You are more likely to quit if you go to many counseling sessions.  Using resources and support systems, such as: ? Online chats with a counselor. ? Phone quitlines. ? Printed self-help materials. ? Support groups or group counseling. ? Text messaging programs. ? Mobile phone apps or applications.  Taking medicines. Some of these medicines may have nicotine in them. If you are pregnant or breastfeeding, do not take any medicines to quit smoking unless your doctor says it is okay. Talk with your doctor about counseling or other things that can help you.  Talk with your doctor about using more than one strategy at the same time, such as taking medicines while you are also going to in-person counseling. This can help make  quitting easier. What things can I do to make it easier to quit? Quitting smoking might feel very hard at first, but there is a lot that you can do to make it easier. Take these steps:  Talk to your family and friends. Ask them to support and encourage you.  Call phone quitlines, reach out to support groups, or work with a counselor.  Ask people who smoke to not smoke around you.  Avoid places that make you want (trigger) to smoke, such as: ? Bars. ? Parties. ? Smoke-break areas at work.  Spend time with people who do not smoke.  Lower the stress in your life. Stress can make you want to smoke. Try these things to help your stress: ? Getting regular exercise. ? Deep-breathing exercises. ? Yoga. ? Meditating. ? Doing a body scan. To do this, close your eyes, focus on one area of your body at a time from head to toe, and notice which parts of your body are tense. Try to relax the muscles in those areas.  Download or buy apps on your mobile phone or tablet that can help you stick to your quit plan. There are many free apps, such as QuitGuide from the CDC (Centers for Disease Control and Prevention). You can find more support from smokefree.gov and other websites.  This information is not intended to replace advice given to you by your health care provider. Make sure you discuss any questions you have with your health care provider. Document Released: 02/10/2009 Document   Revised: 12/13/2015 Document Reviewed: 08/31/2014 Elsevier Interactive Patient Education  2018 Elsevier Inc.  

## 2017-05-30 ENCOUNTER — Telehealth: Payer: Self-pay | Admitting: Family Medicine

## 2017-05-30 LAB — CMP14+EGFR
ALBUMIN: 4.5 g/dL (ref 3.6–4.8)
ALT: 15 IU/L (ref 0–32)
AST: 18 IU/L (ref 0–40)
Albumin/Globulin Ratio: 1.8 (ref 1.2–2.2)
Alkaline Phosphatase: 92 IU/L (ref 39–117)
BUN / CREAT RATIO: 22 (ref 12–28)
BUN: 17 mg/dL (ref 8–27)
Bilirubin Total: <0.2 mg/dL (ref 0.0–1.2)
CALCIUM: 9.7 mg/dL (ref 8.7–10.3)
CO2: 20 mmol/L (ref 20–29)
CREATININE: 0.79 mg/dL (ref 0.57–1.00)
Chloride: 104 mmol/L (ref 96–106)
GFR calc Af Amer: 93 mL/min/{1.73_m2} (ref >59)
GFR, EST NON AFRICAN AMERICAN: 80 mL/min/{1.73_m2} (ref >59)
GLOBULIN, TOTAL: 2.5 g/dL (ref 1.5–4.5)
Glucose: 118 mg/dL — ABNORMAL HIGH (ref 65–99)
Potassium: 4.8 mmol/L (ref 3.5–5.2)
SODIUM: 142 mmol/L (ref 134–144)
Total Protein: 7 g/dL (ref 6.0–8.5)

## 2017-05-30 LAB — LIPID PANEL
CHOLESTEROL TOTAL: 156 mg/dL (ref 100–199)
Chol/HDL Ratio: 3.9 ratio (ref 0.0–4.4)
HDL: 40 mg/dL (ref >39)
LDL CALC: 102 mg/dL — AB (ref 0–99)
Triglycerides: 68 mg/dL (ref 0–149)
VLDL Cholesterol Cal: 14 mg/dL (ref 5–40)

## 2017-05-30 LAB — MICROALBUMIN / CREATININE URINE RATIO
Creatinine, Urine: 134.6 mg/dL
MICROALB/CREAT RATIO: 8.1 mg/g{creat} (ref 0.0–30.0)
MICROALBUM., U, RANDOM: 10.9 ug/mL

## 2017-05-30 NOTE — Telephone Encounter (Signed)
Spoke with the patient and reassured her on the phone that she is not a high risk patient for amputations given the absence of peripheral vascular disease, diabetic neuropathy, diabetic ulcers however she would like to switch to another oral pill.  Declined insulin. She will call her insurance company and get back to [place] with the covered alternatives so we can make the change.

## 2017-05-30 NOTE — Telephone Encounter (Signed)
Patient called and stated that the new medication you put her on, one of the side effects is amputation and she doesn't want to take it. She would like to speak with. Please call patient back as soon as possible.

## 2017-05-31 ENCOUNTER — Telehealth: Payer: Self-pay

## 2017-05-31 NOTE — Telephone Encounter (Signed)
Patient was called and informed of lab results. 

## 2017-06-21 ENCOUNTER — Other Ambulatory Visit: Payer: Self-pay | Admitting: Family Medicine

## 2017-06-21 DIAGNOSIS — I10 Essential (primary) hypertension: Secondary | ICD-10-CM

## 2017-07-24 ENCOUNTER — Other Ambulatory Visit: Payer: Self-pay | Admitting: Family Medicine

## 2017-07-24 DIAGNOSIS — I10 Essential (primary) hypertension: Secondary | ICD-10-CM

## 2017-08-02 ENCOUNTER — Ambulatory Visit: Payer: BLUE CROSS/BLUE SHIELD | Admitting: Family Medicine

## 2017-10-01 ENCOUNTER — Ambulatory Visit: Payer: BLUE CROSS/BLUE SHIELD | Admitting: Family Medicine

## 2017-10-14 ENCOUNTER — Other Ambulatory Visit: Payer: Self-pay | Admitting: Family Medicine

## 2017-10-14 DIAGNOSIS — Z1231 Encounter for screening mammogram for malignant neoplasm of breast: Secondary | ICD-10-CM

## 2017-10-30 ENCOUNTER — Ambulatory Visit: Payer: BLUE CROSS/BLUE SHIELD

## 2017-11-18 ENCOUNTER — Ambulatory Visit: Payer: BLUE CROSS/BLUE SHIELD | Attending: Family Medicine | Admitting: Family Medicine

## 2017-11-18 ENCOUNTER — Encounter: Payer: Self-pay | Admitting: Family Medicine

## 2017-11-18 DIAGNOSIS — E119 Type 2 diabetes mellitus without complications: Secondary | ICD-10-CM | POA: Insufficient documentation

## 2017-11-18 DIAGNOSIS — Z7984 Long term (current) use of oral hypoglycemic drugs: Secondary | ICD-10-CM | POA: Insufficient documentation

## 2017-11-18 DIAGNOSIS — Z833 Family history of diabetes mellitus: Secondary | ICD-10-CM | POA: Diagnosis not present

## 2017-11-18 DIAGNOSIS — Z86010 Personal history of colonic polyps: Secondary | ICD-10-CM | POA: Insufficient documentation

## 2017-11-18 DIAGNOSIS — Z79899 Other long term (current) drug therapy: Secondary | ICD-10-CM | POA: Diagnosis not present

## 2017-11-18 DIAGNOSIS — E7800 Pure hypercholesterolemia, unspecified: Secondary | ICD-10-CM | POA: Diagnosis not present

## 2017-11-18 DIAGNOSIS — I10 Essential (primary) hypertension: Secondary | ICD-10-CM | POA: Diagnosis not present

## 2017-11-18 MED ORDER — GLIPIZIDE ER 10 MG PO TB24: 20.0000 mg | ORAL_TABLET | Freq: Every day | ORAL | 6 refills | Status: DC

## 2017-11-18 MED ORDER — SIMVASTATIN 40 MG PO TABS: 40.0000 mg | ORAL_TABLET | Freq: Every day | ORAL | 6 refills | Status: DC

## 2017-11-18 MED ORDER — EMPAGLIFLOZIN 25 MG PO TABS: 25.0000 mg | ORAL_TABLET | Freq: Every day | ORAL | 6 refills | Status: DC

## 2017-11-18 MED ORDER — LISINOPRIL-HYDROCHLOROTHIAZIDE 20-25 MG PO TABS: 1.0000 | ORAL_TABLET | Freq: Every day | ORAL | 6 refills | Status: DC

## 2017-11-18 MED ORDER — METFORMIN HCL 500 MG PO TABS: 500.0000 mg | ORAL_TABLET | Freq: Two times a day (BID) | ORAL | 6 refills | Status: DC

## 2017-11-18 NOTE — Progress Notes (Signed)
Subjective:  Patient ID: Haley Mendez, female    DOB: [birth year]  Age: 62 y.o. MRN: [id number]  CC: Diabetes   HPI Haley Mendez is a 62 year old female with a history of type 2 diabetes mellitus (A1c 8.3), hypertension, hyperlipidemia who presents today for a follow-up visit. She informs me she  will be using CBD oil for management of her medical conditions as she is wary of the side effects of several medications including Invokana which she has seen in the media with news of amputations.  Her Dad was a diabetic and had amputations and her brother also passed away from diabetes and prior to his death had several sores on his lower extremities. She endorses compliance with her diabetic medications but does not check her sugar because she does not like poking her finger.  Denies blurry vision, paresthesias. She complains of growling of her stomach with excessive gas and heartburn which she states is not responding to omeprazole.  H. pylori breath test in 10/2016 was negative and she has an upcoming appointment with GI. She is concerned she has lost 3 pounds since her last office visit. Doing well on her antihypertensive and denies adverse effects. Tolerates her statin and denies myalgias.  Past Medical History:  Diagnosis Date  . Arthritis   . Bone spur of other site    left rotator cuff  . Diabetes mellitus without complication ([place])   . History of colon polyps   . Hyperlipidemia   . Hypertension     History reviewed. No pertinent surgical history.  No Known Allergies   Outpatient Medications Prior to Visit  Medication Sig Dispense Refill  . canagliflozin (INVOKANA) 100 MG TABS tablet Take 1 tablet (100 mg total) by mouth daily before breakfast. 30 tablet 6  . glipiZIDE (GLUCOTROL XL) 10 MG 24 hr tablet Take 2 tablets (20 mg total) by mouth daily with breakfast. 60 tablet 6  . lisinopril-hydrochlorothiazide (PRINZIDE,ZESTORETIC) 20-25 MG tablet Take 1 tablet by mouth daily. 30  tablet 6  . metFORMIN (GLUCOPHAGE) 1000 MG tablet Take 1 tablet (1,000 mg total) by mouth 2 (two) times daily with a meal. 60 tablet 6  . simvastatin (ZOCOR) 40 MG tablet Take 1 tablet (40 mg total) by mouth at bedtime. 30 tablet 6  . Blood Glucose Monitoring Suppl (ONE TOUCH ULTRA 2) w/Device KIT BS check BID for E11.9 (Patient not taking: Reported on 11/18/2017) 1 each 0  . buPROPion (WELLBUTRIN SR) 150 MG 12 hr tablet Take 1 tablet (150 mg total) by mouth 2 (two) times daily. (Patient not taking: Reported on 11/18/2017) 60 tablet 3  . glucose blood (ONE TOUCH TEST STRIPS) test strip Use to check blood sugar daily Dx E11.9 (Patient not taking: Reported on 11/18/2017) 100 each 3  . omeprazole (PRILOSEC) 40 MG capsule TAKE 1 CAPSULE BY MOUTH EVERY DAY (Patient not taking: Reported on 05/29/2017) 30 capsule 0  . ONETOUCH DELICA LANCETS FINE MISC 1 each by Does not apply route daily. Dx 250.00 (Patient not taking: Reported on 11/18/2017) 100 each 5   No facility-administered medications prior to visit.     ROS Review of Systems  Constitutional: Negative for activity change, appetite change and fatigue.  HENT: Negative for congestion, sinus pressure and sore throat.   Eyes: Negative for visual disturbance.  Respiratory: Negative for cough, chest tightness, shortness of breath and wheezing.   Cardiovascular: Negative for chest pain and palpitations.  Gastrointestinal: Negative for abdominal distention, abdominal pain and constipation.  Endocrine: Negative  for polydipsia.  Genitourinary: Negative for dysuria and frequency.  Musculoskeletal: Negative for arthralgias and back pain.  Skin: Negative for rash.  Neurological: Negative for tremors, light-headedness and numbness.  Hematological: Does not bruise/bleed easily.  Psychiatric/Behavioral: Negative for agitation and behavioral problems.    Objective:  BP 129/73   Pulse 69   Temp 98.5 F (36.9 C) (Oral)   Ht 5' 3"  (1.6 m)   Wt 139 lb (63  kg)   SpO2 96%   BMI 24.62 kg/m   BP/Weight 11/18/2017 05/29/2017 01/26/2445  Systolic BP 286 381 771  Diastolic BP 73 69 76  Wt. (Lbs) 139 142.4 134.4  BMI 24.62 25.23 23.81      Physical Exam  Constitutional: She is oriented to person, place, and time. She appears well-developed and well-nourished.  Cardiovascular: Normal rate, normal heart sounds and intact distal pulses.  No murmur heard. Pulmonary/Chest: Effort normal and breath sounds normal. She has no wheezes. She has no rales. She exhibits no tenderness.  Abdominal: Soft. Bowel sounds are normal. She exhibits no distension and no mass. There is no tenderness.  Musculoskeletal: Normal range of motion.  Neurological: She is alert and oriented to person, place, and time.  Skin: Skin is warm and dry.     CMP Latest Ref Rng & Units 05/29/2017 03/16/2016 12/02/2015  Glucose 65 - 99 mg/dL 118(H) 115(H) 102(H)  BUN 8 - 27 mg/dL 17 16 16   Creatinine 0.57 - 1.00 mg/dL 0.79 0.80 0.65  Sodium 134 - 144 mmol/L 142 135 139  Potassium 3.5 - 5.2 mmol/L 4.8 4.1 5.3  Chloride 96 - 106 mmol/L 104 104 102  CO2 20 - 29 mmol/L 20 21 23   Calcium 8.7 - 10.3 mg/dL 9.7 9.9 9.2  Total Protein 6.0 - 8.5 g/dL 7.0 7.1 6.8  Total Bilirubin 0.0 - 1.2 mg/dL <0.2 0.2 0.3  Alkaline Phos 39 - 117 IU/L 92 86 73  AST 0 - 40 IU/L 18 16 14   ALT 0 - 32 IU/L 15 14 11     Lipid Panel     Component Value Date/Time   CHOL 156 05/29/2017 1019   TRIG 68 05/29/2017 1019   HDL 40 05/29/2017 1019   CHOLHDL 3.9 05/29/2017 1019   CHOLHDL 2.8 12/02/2015 0945   VLDL 18 12/02/2015 0945   LDLCALC 102 (H) 05/29/2017 1019    Lab Results  Component Value Date   HGBA1C 8.3 05/29/2017    Assessment & Plan:   1. Type 2 diabetes mellitus without complication, without long-term current use of insulin (HCC) Uncontrolled with A1c of 8.3 Discontinue Invokana due to concerns about amputation Commence Jardiance Reduced dose of metformin due to GI complaints She  declines initiation of insulin Counseled on Diabetic diet, my plate method, 165 minutes of moderate intensity exercise/week Keep blood sugar logs with fasting goals of 80-120 mg/dl, random of less than 180 and in the event of sugars less than 60 mg/dl or greater than 400 mg/dl please notify the clinic ASAP. It is recommended that you undergo annual eye exams and annual foot exams. Pneumonia vaccine is recommended. - CMP14+EGFR; Future - Lipid panel; Future - empagliflozin (JARDIANCE) 25 MG TABS tablet; Take 25 mg by mouth daily.  Dispense: 30 tablet; Refill: 6 - metFORMIN (GLUCOPHAGE) 500 MG tablet; Take 1 tablet (500 mg total) by mouth 2 (two) times daily with a meal.  Dispense: 60 tablet; Refill: 6 - glipiZIDE (GLUCOTROL XL) 10 MG 24 hr tablet; Take 2 tablets (20 mg total)  by mouth daily with breakfast.  Dispense: 60 tablet; Refill: 6 - Hemoglobin A1c; Future  2. Pure hypercholesterolemia Controlled Low cholesterol diet - simvastatin (ZOCOR) 40 MG tablet; Take 1 tablet (40 mg total) by mouth at bedtime.  Dispense: 30 tablet; Refill: 6  3. Essential hypertension Controlled Counseled on blood pressure goal of less than 130/80, low-sodium, DASH diet, medication compliance, 150 minutes of moderate intensity exercise per week. Discussed medication compliance, adverse effects. - lisinopril-hydrochlorothiazide (PRINZIDE,ZESTORETIC) 20-25 MG tablet; Take 1 tablet by mouth daily.  Dispense: 30 tablet; Refill: 6   Meds ordered this encounter  Medications  . empagliflozin (JARDIANCE) 25 MG TABS tablet    Sig: Take 25 mg by mouth daily.    Dispense:  30 tablet    Refill:  6    Discontinue Invokana  . metFORMIN (GLUCOPHAGE) 500 MG tablet    Sig: Take 1 tablet (500 mg total) by mouth 2 (two) times daily with a meal.    Dispense:  60 tablet    Refill:  6    Discontinue previous dose  . simvastatin (ZOCOR) 40 MG tablet    Sig: Take 1 tablet (40 mg total) by mouth at bedtime.    Dispense:  30  tablet    Refill:  6  . lisinopril-hydrochlorothiazide (PRINZIDE,ZESTORETIC) 20-25 MG tablet    Sig: Take 1 tablet by mouth daily.    Dispense:  30 tablet    Refill:  6    Discontinue lisinopril, discontinue HCTZ  . glipiZIDE (GLUCOTROL XL) 10 MG 24 hr tablet    Sig: Take 2 tablets (20 mg total) by mouth daily with breakfast.    Dispense:  60 tablet    Refill:  6    Follow-up: Return in about 3 months (around 02/18/2018) for Follow-up of diabetes mellitus.   Charlott Rakes MD

## 2017-11-18 NOTE — Patient Instructions (Signed)
Diabetes Mellitus and Nutrition When you have diabetes (diabetes mellitus), it is very important to have healthy eating habits because your blood sugar (glucose) levels are greatly affected by what you eat and drink. Eating healthy foods in the appropriate amounts, at about the same times every day, can help you:  Control your blood glucose.  Lower your risk of heart disease.  Improve your blood pressure.  Reach or maintain a healthy weight.  Every person with diabetes is different, and each person has different needs for a meal plan. Your health care provider may recommend that you work with a diet and nutrition specialist (dietitian) to make a meal plan that is best for you. Your meal plan may vary depending on factors such as:  The calories you need.  The medicines you take.  Your weight.  Your blood glucose, blood pressure, and cholesterol levels.  Your activity level.  Other health conditions you have, such as heart or kidney disease.  How do carbohydrates affect me? Carbohydrates affect your blood glucose level more than any other type of food. Eating carbohydrates naturally increases the amount of glucose in your blood. Carbohydrate counting is a method for keeping track of how many carbohydrates you eat. Counting carbohydrates is important to keep your blood glucose at a healthy level, especially if you use insulin or take certain oral diabetes medicines. It is important to know how many carbohydrates you can safely have in each meal. This is different for every person. Your dietitian can help you calculate how many carbohydrates you should have at each meal and for snack. Foods that contain carbohydrates include:  Bread, cereal, rice, pasta, and crackers.  Potatoes and corn.  Peas, beans, and lentils.  Milk and yogurt.  Fruit and juice.  Desserts, such as cakes, cookies, ice cream, and candy.  How does alcohol affect me? Alcohol can cause a sudden decrease in blood  glucose (hypoglycemia), especially if you use insulin or take certain oral diabetes medicines. Hypoglycemia can be a life-threatening condition. Symptoms of hypoglycemia (sleepiness, dizziness, and confusion) are similar to symptoms of having too much alcohol. If your health care provider says that alcohol is safe for you, follow these guidelines:  Limit alcohol intake to no more than 1 drink per day for nonpregnant women and 2 drinks per day for men. One drink equals 12 oz of beer, 5 oz of wine, or 1 oz of hard liquor.  Do not drink on an empty stomach.  Keep yourself hydrated with water, diet soda, or unsweetened iced tea.  Keep in mind that regular soda, juice, and other mixers may contain a lot of sugar and must be counted as carbohydrates.  What are tips for following this plan? Reading food labels  Start by checking the serving size on the label. The amount of calories, carbohydrates, fats, and other nutrients listed on the label are based on one serving of the food. Many foods contain more than one serving per package.  Check the total grams (g) of carbohydrates in one serving. You can calculate the number of servings of carbohydrates in one serving by dividing the total carbohydrates by 15. For example, if a food has 30 g of total carbohydrates, it would be equal to 2 servings of carbohydrates.  Check the number of grams (g) of saturated and trans fats in one serving. Choose foods that have low or no amount of these fats.  Check the number of milligrams (mg) of sodium in one serving. Most people   should limit total sodium intake to less than 2,300 mg per day.  Always check the nutrition information of foods labeled as "low-fat" or "nonfat". These foods may be higher in added sugar or refined carbohydrates and should be avoided.  Talk to your dietitian to identify your daily goals for nutrients listed on the label. Shopping  Avoid buying canned, premade, or processed foods. These  foods tend to be high in fat, sodium, and added sugar.  Shop around the outside edge of the grocery store. This includes fresh fruits and vegetables, bulk grains, fresh meats, and fresh dairy. Cooking  Use low-heat cooking methods, such as baking, instead of high-heat cooking methods like deep frying.  Cook using healthy oils, such as olive, canola, or sunflower oil.  Avoid cooking with butter, cream, or high-fat meats. Meal planning  Eat meals and snacks regularly, preferably at the same times every day. Avoid going long periods of time without eating.  Eat foods high in fiber, such as fresh fruits, vegetables, beans, and whole grains. Talk to your dietitian about how many servings of carbohydrates you can eat at each meal.  Eat 4-6 ounces of lean protein each day, such as lean meat, chicken, fish, eggs, or tofu. 1 ounce is equal to 1 ounce of meat, chicken, or fish, 1 egg, or 1/4 cup of tofu.  Eat some foods each day that contain healthy fats, such as avocado, nuts, seeds, and fish. Lifestyle   Check your blood glucose regularly.  Exercise at least 30 minutes 5 or more days each week, or as told by your health care provider.  Take medicines as told by your health care provider.  Do not use any products that contain nicotine or tobacco, such as cigarettes and e-cigarettes. If you need help quitting, ask your health care provider.  Work with a counselor or diabetes educator to identify strategies to manage stress and any emotional and social challenges. What are some questions to ask my health care provider?  Do I need to meet with a diabetes educator?  Do I need to meet with a dietitian?  What number can I call if I have questions?  When are the best times to check my blood glucose? Where to find more information:  American Diabetes Association: diabetes.org/food-and-fitness/food  Academy of Nutrition and Dietetics:  www.eatright.org/resources/health/diseases-and-conditions/diabetes  National Institute of Diabetes and Digestive and Kidney Diseases (NIH): www.niddk.nih.gov/health-information/diabetes/overview/diet-eating-physical-activity Summary  A healthy meal plan will help you control your blood glucose and maintain a healthy lifestyle.  Working with a diet and nutrition specialist (dietitian) can help you make a meal plan that is best for you.  Keep in mind that carbohydrates and alcohol have immediate effects on your blood glucose levels. It is important to count carbohydrates and to use alcohol carefully. This information is not intended to replace advice given to you by your health care provider. Make sure you discuss any questions you have with your health care provider. Document Released: 01/11/2005 Document Revised: 05/21/2016 Document Reviewed: 05/21/2016 Elsevier Interactive Patient Education  2018 [employer]  

## 2017-11-19 ENCOUNTER — Ambulatory Visit: Payer: BLUE CROSS/BLUE SHIELD | Attending: Family Medicine

## 2017-11-19 DIAGNOSIS — E119 Type 2 diabetes mellitus without complications: Secondary | ICD-10-CM | POA: Diagnosis present

## 2017-11-19 NOTE — Progress Notes (Signed)
Patient here for lab visit only 

## 2017-11-20 LAB — CMP14+EGFR
ALBUMIN: 4.5 g/dL (ref 3.6–4.8)
ALT: 16 IU/L (ref 0–32)
AST: 14 IU/L (ref 0–40)
Albumin/Globulin Ratio: 2 (ref 1.2–2.2)
Alkaline Phosphatase: 83 IU/L (ref 39–117)
BUN/Creatinine Ratio: 22 (ref 12–28)
BUN: 20 mg/dL (ref 8–27)
Bilirubin Total: <0.2 mg/dL (ref 0.0–1.2)
CALCIUM: 9.7 mg/dL (ref 8.7–10.3)
CO2: 21 mmol/L (ref 20–29)
CREATININE: 0.9 mg/dL (ref 0.57–1.00)
Chloride: 104 mmol/L (ref 96–106)
GFR calc Af Amer: 79 mL/min/{1.73_m2} (ref >59)
GFR, EST NON AFRICAN AMERICAN: 68 mL/min/{1.73_m2} (ref >59)
GLOBULIN, TOTAL: 2.2 g/dL (ref 1.5–4.5)
Glucose: 67 mg/dL (ref 65–99)
Potassium: 4.7 mmol/L (ref 3.5–5.2)
SODIUM: 141 mmol/L (ref 134–144)
Total Protein: 6.7 g/dL (ref 6.0–8.5)

## 2017-11-20 LAB — LIPID PANEL
Chol/HDL Ratio: 4.8 ratio — ABNORMAL HIGH (ref 0.0–4.4)
Cholesterol, Total: 158 mg/dL (ref 100–199)
HDL: 33 mg/dL — ABNORMAL LOW (ref >39)
LDL Calculated: 104 mg/dL — ABNORMAL HIGH (ref 0–99)
Triglycerides: 105 mg/dL (ref 0–149)
VLDL Cholesterol Cal: 21 mg/dL (ref 5–40)

## 2017-11-20 LAB — HEMOGLOBIN A1C
Est. average glucose Bld gHb Est-mCnc: 171 mg/dL
Hgb A1c MFr Bld: 7.6 % — ABNORMAL HIGH (ref 4.8–5.6)

## 2017-11-22 ENCOUNTER — Telehealth: Payer: Self-pay

## 2017-11-22 NOTE — Telephone Encounter (Signed)
Patient was called and informed of lab results. 

## 2017-11-29 ENCOUNTER — Ambulatory Visit
Admission: RE | Admit: 2017-11-29 | Discharge: 2017-11-29 | Disposition: A | Discharge status: Home / Self Care | Payer: BLUE CROSS/BLUE SHIELD | Source: Ambulatory Visit | Attending: Family Medicine | Admitting: Family Medicine

## 2017-11-29 DIAGNOSIS — Z1231 Encounter for screening mammogram for malignant neoplasm of breast: Secondary | ICD-10-CM

## 2017-12-05 ENCOUNTER — Other Ambulatory Visit: Payer: Self-pay | Admitting: Gastroenterology

## 2017-12-05 DIAGNOSIS — R1111 Vomiting without nausea: Secondary | ICD-10-CM

## 2017-12-10 LAB — HM DIABETES EYE EXAM

## 2017-12-11 NOTE — Progress Notes (Signed)
[facility] Note  12/12/2017     CHIEF COMPLAINT Patient presents for Retina Evaluation   HISTORY OF PRESENT ILLNESS: Haley Mendez is a 62 y.o. female who presents to the clinic today for:   HPI    Retina Evaluation    In both eyes.  Associated Symptoms Negative for Flashes, Blind Spot, Photophobia, Scalp Tenderness, Fever, Weight Loss, Jaw Claudication, Glare, Pain, Floaters, Distortion, Redness, Trauma, Shoulder/Hip pain and Fatigue.  Context:  distance vision, mid-range vision and near vision.  Treatments tried include no treatments.  Response to treatment was no improvement.  I, the attending physician,  performed the HPI with the patient and updated documentation appropriately.          Comments    Pt presents on the referral of Dr. Rexene Edison for lattice degeneration and retinal hole OU, pt states she saw him for a regular eye exam on Tuesday, pt states she is not having an issues with her eyes other than occasional blurriness, pt is diabetic, pts last A1C was 7.6, pt does not check her blood sugar at home regularly       Last edited by Bernarda Caffey, MD on 12/12/2017  9:55 AM. (History)    Pt states she was seen for the first time at Dr. Patrici Ranks office for routine eye exam; Pt states she has had occasional floaters and flashes OU;   Referring physician: Clent Jacks, MD [place], [place] [address]  HISTORICAL INFORMATION:   Selected notes from the MEDICAL RECORD NUMBER Referred by A. Lundquist, PA for concern of supeiror lattice degeneration OU and retinal hole OU LEE: 08.13.19 (A. Lundquist) [BCVA OD: 20/20 OS: 20/20] Ocular Hx-melanosis, cataract OU, DES OU PMH-DM (taking Metformin, Jardiance, glipizide, last A1C - 7.6), HTN, high cholesterol, arthritis    CURRENT MEDICATIONS: Current Outpatient Medications (Ophthalmic Drugs)  Medication Sig  . prednisoLONE acetate (PRED FORTE) 1 % ophthalmic suspension Place 1 drop into  the right eye 4 (four) times daily for 7 days.   No current facility-administered medications for this visit.  (Ophthalmic Drugs)   Current Outpatient Medications (Other)  Medication Sig  . Blood Glucose Monitoring Suppl (ONE TOUCH ULTRA 2) w/Device KIT BS check BID for E11.9 (Patient not taking: Reported on 11/18/2017)  . buPROPion (WELLBUTRIN SR) 150 MG 12 hr tablet Take 1 tablet (150 mg total) by mouth 2 (two) times daily. (Patient not taking: Reported on 11/18/2017)  . empagliflozin (JARDIANCE) 25 MG TABS tablet Take 25 mg by mouth daily.  Marland Kitchen glipiZIDE (GLUCOTROL XL) 10 MG 24 hr tablet Take 2 tablets (20 mg total) by mouth daily with breakfast.  . glucose blood (ONE TOUCH TEST STRIPS) test strip Use to check blood sugar daily Dx E11.9 (Patient not taking: Reported on 11/18/2017)  . INVOKANA 100 MG TABS tablet   . lisinopril-hydrochlorothiazide (PRINZIDE,ZESTORETIC) 20-25 MG tablet Take 1 tablet by mouth daily.  . metFORMIN (GLUCOPHAGE) 500 MG tablet Take 1 tablet (500 mg total) by mouth 2 (two) times daily with a meal.  . omeprazole (PRILOSEC) 40 MG capsule TAKE 1 CAPSULE BY MOUTH EVERY DAY (Patient not taking: Reported on 05/29/2017)  . ONETOUCH DELICA LANCETS FINE MISC 1 each by Does not apply route daily. Dx 250.00 (Patient not taking: Reported on 11/18/2017)  . simvastatin (ZOCOR) 40 MG tablet Take 1 tablet (40 mg total) by mouth at bedtime.   No current facility-administered medications for this visit.  (Other)  REVIEW OF SYSTEMS: ROS    Positive for: Musculoskeletal, Endocrine, Cardiovascular, Eyes   Negative for: Constitutional, Gastrointestinal, Neurological, Skin, Genitourinary, HENT, Respiratory, Psychiatric, Allergic/Imm, Heme/Lymph   Last edited by Debbrah Alar, COT on 12/12/2017  9:00 AM. (History)       ALLERGIES Allergies  Allergen Reactions  . Align Prebiotic-Probiotic     PAST MEDICAL HISTORY Past Medical History:  Diagnosis Date  . Arthritis   . Bone  spur of other site    left rotator cuff  . Diabetes mellitus without complication ([place])   . History of colon polyps   . Hyperlipidemia   . Hypertension    History reviewed. No pertinent surgical history.  FAMILY HISTORY Family History  Problem Relation Age of Onset  . Early death Mother   . Diabetes Father   . Hypertension Father   . Diabetes Brother   . Diabetes Brother   . Amblyopia Neg Hx   . Blindness Neg Hx   . Cataracts Neg Hx   . Glaucoma Neg Hx   . Macular degeneration Neg Hx   . Retinal detachment Neg Hx   . Strabismus Neg Hx   . Retinitis pigmentosa Neg Hx     SOCIAL HISTORY Social History   Tobacco Use  . Smoking status: Current Every Day Smoker    Packs/day: 0.50    Years: 15.00    Pack years: 7.50    Types: Cigarettes  . Smokeless tobacco: Never Used  Substance Use Topics  . Alcohol use: Yes    Alcohol/week: 2.0 standard drinks    Types: 2 Cans of beer per week    Comment: occasional  . Drug use: No         OPHTHALMIC EXAM:  Base Eye Exam    Visual Acuity (Snellen - Linear)      Right Left   Dist cc 20/20 -1 20/20 -2   Dist ph cc 20/20 20/20 -1       Tonometry (Tonopen, 9:10 AM)      Right Left   Pressure 32 26       Tonometry #2 (Tonopen, 9:10 AM)      Right Left   Pressure 24 29       Tonometry #3 (Tonopen, 9:14 AM)      Right Left   Pressure 22 22       Pupils      Dark Light Shape React APD   Right 3 2 Round Brisk None   Left 3 2 Round Brisk None       Visual Fields (Counting fingers)      Left Right    Full Full       Extraocular Movement      Right Left    Full, Ortho Full, Ortho       Neuro/Psych    Oriented x3:  Yes   Mood/Affect:  Normal       Dilation    Both eyes:  1.0% Mydriacyl, 2.5% Phenylephrine @ 9:24 AM        Slit Lamp and Fundus Exam    Slit Lamp Exam      Right Left   Lids/Lashes Dermatochalasis - upper lid, Meibomian gland dysfunction Dermatochalasis - upper lid, Meibomian gland  dysfunction   Conjunctiva/Sclera Melanosis Melanosis   Cornea Arcus Arcus   Anterior Chamber Moderate depth, Temporal Narrow angle Moderate depth, Temporal Narrow angle   Iris Round and moderately dilated to 28m Round and moderately dilated to 651m  Lens 2+ Nuclear sclerosis, 2+ Cortical cataract 2+ Nuclear sclerosis, 2+ Cortical cataract   Vitreous Vitreous syneresis Vitreous syneresis       Fundus Exam      Right Left   Disc Pink and Sharp Pink and Sharp   C/D Ratio 0.4 0.4   Macula Good foveal reflex, Retinal pigment epithelial mottling, No heme or edema Good foveal reflex, Retinal pigment epithelial mottling, No heme or edema   Vessels Copper wiring, AV crossing changes Copper wiring, AV crossing changes   Periphery Attached, pigmented Lattice degeneration from 1100 to 1200 with atrophic holes, blot hemorrhage at 0500, pigmented lattice with atrophic hole and overlying vitreous syneresis at 0730, pigmented lattice at 1030 Attached, pigmented Lattice degeneration at 1200, blot hemorrhage at 0300, small patch of pigmented lattice at 0600, pigmented patch of lattice at 0700, very small patch of lattice at 1030 equator          IMAGING AND PROCEDURES  Imaging and Procedures for _0 @  OCT, Retina - OU - Both Eyes       Right Eye Quality was good. Central Foveal Thickness: 285. Progression has no prior data. Findings include normal foveal contour, no IRF, no SRF, vitreomacular adhesion .   Left Eye Quality was good. Central Foveal Thickness: 274. Progression has no prior data. Findings include normal foveal contour, no IRF, no SRF, vitreomacular adhesion .   Notes *Images captured and stored on drive  Diagnosis / Impression:  NFP, No IRF/SRF, VMA  Clinical management:  See below  Abbreviations: NFP - Normal foveal profile. CME - cystoid macular edema. PED - pigment epithelial detachment. IRF - intraretinal fluid. SRF - subretinal fluid. EZ - ellipsoid zone. ERM -  epiretinal membrane. ORA - outer retinal atrophy. ORT - outer retinal tubulation. SRHM - subretinal hyper-reflective material         Repair Retinal Breaks, Laser - OD - Right Eye       LASER PROCEDURE NOTE  Procedure:  Barrier laser retinopexy using slit lamp laser, RIGHT eye   Diagnosis:   Lattice degeneration with retinal holes, RIGHT eye                     Patches [id number], 0730 and 1030 anterior to equator   Surgeon: Bernarda Caffey, MD, PhD  Anesthesia: Topical  Informed consent obtained, operative eye marked, and time out performed prior to initiation of laser.   Laser settings:  Lumenis [id number] laser, slit lamp Lens: Mainster PRP 165 Power: 280 mW Spot size: 200 microns Duration: 50 msec  # spots: [phone]  Placement of laser: Using a Mainster PRP 165 contact lens at the slit lamp, laser was placed in three confluent rows around patches of lattice with reitnal holes anterior to equator with additional rows anteriorly. Additional anterior laser was administered using laser indirect at the 730 lesion but was unable to be completed due to pt having difficulty holding eye still enough to safely deliver laser.  Complications: None.  Patient tolerated the procedure well and received written and verbal post-procedure care information/education.                  ASSESSMENT/PLAN:    ICD-10-CM   1. Lattice degeneration of both retinas H35.413 Repair Retinal Breaks, Laser - OD - Right Eye  2. 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 Retinal edema H35.81 OCT, Retina - OU - Both Eyes  4. Mild nonproliferative diabetic retinopathy  of both eyes without macular edema associated with type 2 diabetes mellitus ([place]) Q22.2979   5. Essential hypertension I10   6. Hypertensive retinopathy of both eyes H35.033   7. Combined forms of age-related cataract of both eyes H25.813     1,2. Lattice degeneration w/ atrophic holes, OU - OD:  pigmented patches from 1100 to 1200 with atrophic holes, patch at 0730, and 1030 - OS: pigmented lattice at 1200, 0600, 0700, 1030 equator - discussed findings, prognosis, and treatment options including observation - recommend laser retinopexy OU, OD first - pt wishes to proceed with laser retinopexy OD today (08.15.19) - RBA of procedure discussed, questions answered - informed consent obtained and signed - see procedure note  Pt had difficulty completing anterior portion of 0730 patch of lattice  May need subconj lido + LIO to complete that final patch - start PF OD QID x 7 days - f/u in 2 wks for laser retinopexy OS, sooner prn  3,4. Mild nonproliferative diabetic retinopathy w/o DME, both eyes - The incidence, risk factors for progression, natural history and treatment options for diabetic retinopathy were discussed with patient.   - The need for close monitoring of blood glucose, blood pressure, and serum lipids, avoiding cigarette or any type of tobacco, and the need for long term follow up was also discussed with patient. - exam without macular pathology but scattered DBH in periphery OU  - OCT without diabetic macular edema, both eyes  - f/u in 1 year, sooner prn  5,6. Hypertensive retinopathy OU - discussed importance of tight BP control - monitor  7. Combined form age related cataract OU-  - The symptoms of cataract, surgical options, and treatments and risks were discussed with patient. - discussed diagnosis and progression - not yet visually significant - monitor for now   Ophthalmic Meds Ordered this visit:  Meds ordered this encounter  Medications  . prednisoLONE acetate (PRED FORTE) 1 % ophthalmic suspension    Sig: Place 1 drop into the right eye 4 (four) times daily for 7 days.    Dispense:  10 mL    Refill:  0       Return in about 2 weeks (around 12/26/2017) for F/U lattice degen OU, DFE, OCT.  There are no Patient Instructions on file for this  visit.   Explained the diagnoses, plan, and follow up with the patient and they expressed understanding.  Patient expressed understanding of the importance of proper follow up care.   This document serves as a record of services personally performed by Gardiner Sleeper, MD, PhD. It was created on their behalf by Ernest Mallick, OA, an ophthalmic assistant. The creation of this record is the provider's dictation and/or activities during the visit.    Electronically signed by: Ernest Mallick, OA  08.14.2019 12:43 AM   This document serves as a record of services personally performed by Gardiner Sleeper, MD, PhD. It was created on their behalf by Catha Brow, [place], a certified ophthalmic assistant. The creation of this record is the provider's dictation and/or activities during the visit.  Electronically signed by: Catha Brow, COA  08.15.19 12:43 AM    Gardiner Sleeper, M.D., Ph.D. Diseases & Surgery of the Retina and Vitreous [facility]   I have reviewed the above documentation for accuracy and completeness, and I agree with the above. Gardiner Sleeper, M.D., Ph.D. 12/13/17 12:43 AM    Abbreviations: M myopia (nearsighted); A astigmatism; H hyperopia (  farsighted); P presbyopia; Mrx spectacle prescription;  CTL contact lenses; OD right eye; OS left eye; OU both eyes  XT exotropia; ET esotropia; PEK punctate epithelial keratitis; PEE punctate epithelial erosions; DES dry eye syndrome; MGD meibomian gland dysfunction; ATs artificial tears; PFAT's preservative free artificial tears; [place] nuclear sclerotic cataract; PSC posterior subcapsular cataract; ERM epi-retinal membrane; PVD posterior vitreous detachment; RD retinal detachment; DM diabetes mellitus; DR diabetic retinopathy; NPDR non-proliferative diabetic retinopathy; PDR proliferative diabetic retinopathy; CSME clinically significant macular edema; DME diabetic macular edema; dbh dot blot hemorrhages; CWS cotton wool  spot; POAG primary open angle glaucoma; C/D cup-to-disc ratio; HVF humphrey visual field; GVF goldmann visual field; OCT optical coherence tomography; IOP intraocular pressure; BRVO Branch retinal vein occlusion; CRVO central retinal vein occlusion; CRAO central retinal artery occlusion; BRAO branch retinal artery occlusion; RT retinal tear; SB scleral buckle; PPV pars plana vitrectomy; VH Vitreous hemorrhage; PRP panretinal laser photocoagulation; IVK intravitreal kenalog; VMT vitreomacular traction; MH Macular hole;  NVD neovascularization of the disc; NVE neovascularization elsewhere; AREDS age related eye disease study; ARMD age related macular degeneration; POAG primary open angle glaucoma; EBMD epithelial/anterior basement membrane dystrophy; ACIOL anterior chamber intraocular lens; IOL intraocular lens; PCIOL posterior chamber intraocular lens; Phaco/IOL phacoemulsification with intraocular lens placement; [place] photorefractive keratectomy; LASIK laser assisted in situ keratomileusis; HTN hypertension; DM diabetes mellitus; COPD chronic obstructive pulmonary disease

## 2017-12-12 ENCOUNTER — Ambulatory Visit (INDEPENDENT_AMBULATORY_CARE_PROVIDER_SITE_OTHER): Payer: BLUE CROSS/BLUE SHIELD | Admitting: Ophthalmology

## 2017-12-12 ENCOUNTER — Encounter (INDEPENDENT_AMBULATORY_CARE_PROVIDER_SITE_OTHER): Payer: Self-pay | Admitting: Ophthalmology

## 2017-12-12 DIAGNOSIS — H25813 Combined forms of age-related cataract, bilateral: Secondary | ICD-10-CM

## 2017-12-12 DIAGNOSIS — H35413 Lattice degeneration of retina, bilateral: Secondary | ICD-10-CM | POA: Diagnosis not present

## 2017-12-12 DIAGNOSIS — E113293 Type 2 diabetes mellitus with mild nonproliferative diabetic retinopathy without macular edema, bilateral: Secondary | ICD-10-CM

## 2017-12-12 DIAGNOSIS — H33323 Round hole, bilateral: Secondary | ICD-10-CM | POA: Diagnosis not present

## 2017-12-12 DIAGNOSIS — H3581 Retinal edema: Secondary | ICD-10-CM

## 2017-12-12 DIAGNOSIS — I10 Essential (primary) hypertension: Secondary | ICD-10-CM

## 2017-12-12 DIAGNOSIS — H35033 Hypertensive retinopathy, bilateral: Secondary | ICD-10-CM

## 2017-12-12 MED ORDER — PREDNISOLONE ACETATE 1 % OP SUSP: 1.0000 [drp] | Freq: Four times a day (QID) | OPHTHALMIC | 0 refills | Status: AC

## 2017-12-13 ENCOUNTER — Ambulatory Visit
Admission: RE | Admit: 2017-12-13 | Discharge: 2017-12-13 | Disposition: A | Discharge status: Home / Self Care | Payer: BLUE CROSS/BLUE SHIELD | Source: Ambulatory Visit | Attending: Gastroenterology | Admitting: Gastroenterology

## 2017-12-13 ENCOUNTER — Encounter (INDEPENDENT_AMBULATORY_CARE_PROVIDER_SITE_OTHER): Payer: Self-pay | Admitting: Ophthalmology

## 2017-12-13 DIAGNOSIS — R1111 Vomiting without nausea: Secondary | ICD-10-CM

## 2017-12-25 NOTE — Progress Notes (Signed)
[facility] Note  12/26/2017     CHIEF COMPLAINT Patient presents for Retina Follow Up   HISTORY OF PRESENT ILLNESS: Haley Mendez is a 62 y.o. female who presents to the clinic today for:   HPI    Retina Follow Up    Patient presents with  Other.  In both eyes.  This started 1 month ago.  Severity is mild.  Since onset it is gradually improving.  I, the attending physician,  performed the HPI with the patient and updated documentation appropriately.          Comments    F/U Lattice degen. Patient states her vision is occasionally blurry and she occasionally has floaters OU. Pt reports does not monitor BS daily , Bs 120 (12/25/17), BS are usually WINL per pt. Denies new visual issues. Pt is ready for tx today if indicated.        Last edited by Bernarda Caffey, MD on 12/26/2017  9:52 AM. (History)    Pt states she was seen for the first time at Dr. Patrici Ranks office for routine eye exam; Pt states she has had occasional floaters and flashes OU;   Referring physician: Charlott Rakes, MD [place], [place] [address]  HISTORICAL INFORMATION:   Selected notes from the [place] Referred by A. Lundquist, PA for concern of supeiror lattice degeneration OU and retinal hole OU LEE: 08.13.19 (A. Lundquist) [BCVA OD: 20/20 OS: 20/20] Ocular Hx-melanosis, cataract OU, DES OU PMH-DM (taking Metformin, Jardiance, glipizide, last A1C - 7.6), HTN, high cholesterol, arthritis    CURRENT MEDICATIONS: No current outpatient medications on file. (Ophthalmic Drugs)   No current facility-administered medications for this visit.  (Ophthalmic Drugs)   Current Outpatient Medications (Other)  Medication Sig  . Blood Glucose Monitoring Suppl (ONE TOUCH ULTRA 2) w/Device KIT BS check BID for E11.9  . buPROPion (WELLBUTRIN SR) 150 MG 12 hr tablet Take 1 tablet (150 mg total) by mouth 2 (two) times daily.  . empagliflozin (JARDIANCE) 25 MG TABS  tablet Take 25 mg by mouth daily.  Marland Kitchen glipiZIDE (GLUCOTROL XL) 10 MG 24 hr tablet Take 2 tablets (20 mg total) by mouth daily with breakfast.  . glucose blood (ONE TOUCH TEST STRIPS) test strip Use to check blood sugar daily Dx E11.9  . INVOKANA 100 MG TABS tablet   . lisinopril-hydrochlorothiazide (PRINZIDE,ZESTORETIC) 20-25 MG tablet Take 1 tablet by mouth daily.  . metFORMIN (GLUCOPHAGE) 500 MG tablet Take 1 tablet (500 mg total) by mouth 2 (two) times daily with a meal.  . omeprazole (PRILOSEC) 40 MG capsule TAKE 1 CAPSULE BY MOUTH EVERY DAY  . ONETOUCH DELICA LANCETS FINE MISC 1 each by Does not apply route daily. Dx 250.00  . simvastatin (ZOCOR) 40 MG tablet Take 1 tablet (40 mg total) by mouth at bedtime.   No current facility-administered medications for this visit.  (Other)      REVIEW OF SYSTEMS: ROS    Positive for: Endocrine, Eyes   Negative for: Constitutional, Gastrointestinal, Neurological, Skin, Genitourinary, Musculoskeletal, HENT, Cardiovascular, Respiratory, Psychiatric, Allergic/Imm, Heme/Lymph   Last edited by Zenovia Jordan, LPN on 1/94/1740  8:14 AM. (History)       ALLERGIES Allergies  Allergen Reactions  . Align Prebiotic-Probiotic     PAST MEDICAL HISTORY Past Medical History:  Diagnosis Date  . Arthritis   . Bone spur of other site    left rotator cuff  . Diabetes mellitus without complication (  [place])   . History of colon polyps   . Hyperlipidemia   . Hypertension    History reviewed. No pertinent surgical history.  FAMILY HISTORY Family History  Problem Relation Age of Onset  . Early death Mother   . Diabetes Father   . Hypertension Father   . Diabetes Brother   . Diabetes Brother   . Amblyopia Neg Hx   . Blindness Neg Hx   . Cataracts Neg Hx   . Glaucoma Neg Hx   . Macular degeneration Neg Hx   . Retinal detachment Neg Hx   . Strabismus Neg Hx   . Retinitis pigmentosa Neg Hx     SOCIAL HISTORY Social History   Tobacco Use  .  Smoking status: Current Every Day Smoker    Packs/day: 0.50    Years: 15.00    Pack years: 7.50    Types: Cigarettes  . Smokeless tobacco: Never Used  Substance Use Topics  . Alcohol use: Yes    Alcohol/week: 2.0 standard drinks    Types: 2 Cans of beer per week    Comment: occasional  . Drug use: No         OPHTHALMIC EXAM:  Base Eye Exam    Visual Acuity (Snellen - Linear)      Right Left   Dist [place] 20/20 -1 20/20 -2   Dist ph [place] 20/20 20/20 -1       Tonometry (Tonopen, 9:43 AM)      Right Left   Pressure 19 20       Pupils      Dark Light Shape React APD   Right 4 3 Round Brisk None   Left 4 3 Round Brisk None       Visual Fields (Counting fingers)      Left Right    Full Full       Extraocular Movement      Right Left    Full, Ortho Full, Ortho       Neuro/Psych    Oriented x3:  Yes   Mood/Affect:  Normal       Dilation    Both eyes:  1.0% Mydriacyl, 2.5% Phenylephrine @ 9:44 AM        Slit Lamp and Fundus Exam    Slit Lamp Exam      Right Left   Lids/Lashes Dermatochalasis - upper lid, Meibomian gland dysfunction Dermatochalasis - upper lid, Meibomian gland dysfunction   Conjunctiva/Sclera Melanosis Melanosis   Cornea Arcus Arcus   Anterior Chamber Moderate depth, Temporal Narrow angle Moderate depth, Temporal Narrow angle   Iris Round and moderately dilated to 50m Round and moderately dilated to 675m  Lens 2+ Nuclear sclerosis, 2+ Cortical cataract 2+ Nuclear sclerosis, 2+ Cortical cataract   Vitreous Vitreous syneresis Vitreous syneresis       Fundus Exam      Right Left   Disc Pink and Sharp Pink and Sharp   C/D Ratio 0.4 0.4   Macula Good foveal reflex, Retinal pigment epithelial mottling, No heme or edema Good foveal reflex, Retinal pigment epithelial mottling, No heme or edema   Vessels Copper wiring, AV crossing changes Copper wiring, AV crossing changes   Periphery Attached, pigmented Lattice degeneration from 1100 to 1200 with  atrophic holes -- good early laser surrounding, blot hemorrhage at 0500, pigmented lattice with atrophic hole and overlying vitreous syneresis at 0730 -- good early laser posteriorly -- room for fill in, pigmented lattice at 1030 --  good early laser surrounding Attached, pigmented Lattice degeneration at 1200, blot hemorrhage at 0300, small patch of pigmented lattice at 0600, pigmented patch of lattice at 0700, very small patch of lattice at 1030 equator          IMAGING AND PROCEDURES  Imaging and Procedures for @TODAY @   LASER PROCEDURE NOTE  Procedure:     Barrier laser retinopexy using slit lamp laser, RIGHT eye-- touch up  Diagnosis:      Lattice degeneration with retinal holes, RIGHT eye                 Patches [id number], 0730 and 1030 anterior to equator  Surgeon:        Bernarda Caffey, MD, PhD  Anesthesia:    Topical  Informed consent obtained, operative eye marked, and time out performed prior to initiation of laser.  Laser settings: Lumenis Smart532 laser, slit lamp Lens: [place] PRP 165 Power: 270 mW Spot size: 200 microns Duration: 30 msec # spots: 232  Placement of laser: Using a [place] PRP 165 contact lens at the slit lamp, touch up laser was placed in three confluent rows around 730 patch of lattice with reitnal holes anterior to equator  Complications: None.  Patient tolerated the procedure well and received written and verbal post-procedure care information/education.         ASSESSMENT/PLAN:    ICD-10-CM   1. Lattice degeneration of both retinas H35.413 CANCELED: OCT, Retina - OU - Both Eyes  2. 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 Retinal edema H35.81 CANCELED: OCT, Retina - OU - Both Eyes  4. Mild nonproliferative diabetic retinopathy of both eyes without macular edema associated with type 2 diabetes mellitus ([place]) M35.5974   5. Essential hypertension I10   6. Hypertensive retinopathy of both eyes H35.033   7. Combined  forms of age-related cataract of both eyes H25.813     1,2. Lattice degeneration w/ atrophic holes, OU - OD: pigmented patches from 1100 to 1200 with atrophic holes, patch at 0730, and 1030 - OS: pigmented lattice at 1200, 0600, 0700, 1030 equator - s/p laser retinopexy OD 8.15.19 -- good early laser changes, but 0730 region could use touch up - recommend touch up laser retinopexy OD - pt wishes to proceed with laser retinopexy OD today (8.29.19) - RBA of procedure discussed, questions answered - informed consent obtained and signed - see procedure note - restart PF OD QID x 7 days - f/u in 2 wks for laser retinopexy OS, sooner prn (pt will need subconj lido for laser OS)  3,4. Mild nonproliferative diabetic retinopathy w/o DME, both eyes - The incidence, risk factors for progression, natural history and treatment options for diabetic retinopathy were discussed with patient.   - The need for close monitoring of blood glucose, blood pressure, and serum lipids, avoiding cigarette or any type of tobacco, and the need for long term follow up was also discussed with patient. - exam without macular pathology but scattered DBH in periphery OU  - OCT without diabetic macular edema, both eyes  - f/u in 1 year, sooner prn  5,6. Hypertensive retinopathy OU - discussed importance of tight BP control - monitor  7. Combined form age related cataract OU-  - The symptoms of cataract, surgical options, and treatments and risks were discussed with patient. - discussed diagnosis and progression - not yet visually significant - monitor for now   Ophthalmic Meds Ordered this visit:  No orders of the defined types were  placed in this encounter.      Return for 2-3 wks for laser retinopexy OS.  There are no Patient Instructions on file for this visit.   Explained the diagnoses, plan, and follow up with the patient and they expressed understanding.  Patient expressed understanding of the  importance of proper follow up care.   This document serves as a record of services personally performed by Gardiner Sleeper, MD, PhD. It was created on their behalf by Ernest Mallick, OA, an ophthalmic assistant. The creation of this record is the provider's dictation and/or activities during the visit.    Electronically signed by: Ernest Mallick, OA  08.28.2019 12:19 AM     Gardiner Sleeper, M.D., Ph.D. Diseases & Surgery of the Retina and Vitreous [facility]   I have reviewed the above documentation for accuracy and completeness, and I agree with the above. Gardiner Sleeper, M.D., Ph.D. 12/27/17 12:19 AM     Abbreviations: M myopia (nearsighted); A astigmatism; H hyperopia (farsighted); P presbyopia; Mrx spectacle prescription;  CTL contact lenses; OD right eye; OS left eye; OU both eyes  XT exotropia; ET esotropia; PEK punctate epithelial keratitis; PEE punctate epithelial erosions; DES dry eye syndrome; MGD meibomian gland dysfunction; ATs artificial tears; PFAT's preservative free artificial tears; [place] nuclear sclerotic cataract; PSC posterior subcapsular cataract; ERM epi-retinal membrane; PVD posterior vitreous detachment; RD retinal detachment; DM diabetes mellitus; DR diabetic retinopathy; NPDR non-proliferative diabetic retinopathy; PDR proliferative diabetic retinopathy; CSME clinically significant macular edema; DME diabetic macular edema; dbh dot blot hemorrhages; CWS cotton wool spot; POAG primary open angle glaucoma; C/D cup-to-disc ratio; HVF humphrey visual field; GVF goldmann visual field; OCT optical coherence tomography; IOP intraocular pressure; BRVO Branch retinal vein occlusion; CRVO central retinal vein occlusion; CRAO central retinal artery occlusion; BRAO branch retinal artery occlusion; RT retinal tear; SB scleral buckle; PPV pars plana vitrectomy; VH Vitreous hemorrhage; PRP panretinal laser photocoagulation; IVK intravitreal kenalog; VMT vitreomacular  traction; MH Macular hole;  NVD neovascularization of the disc; NVE neovascularization elsewhere; AREDS age related eye disease study; ARMD age related macular degeneration; POAG primary open angle glaucoma; EBMD epithelial/anterior basement membrane dystrophy; ACIOL anterior chamber intraocular lens; IOL intraocular lens; PCIOL posterior chamber intraocular lens; Phaco/IOL phacoemulsification with intraocular lens placement; [place] photorefractive keratectomy; LASIK laser assisted in situ keratomileusis; HTN hypertension; DM diabetes mellitus; COPD chronic obstructive pulmonary disease

## 2017-12-26 ENCOUNTER — Ambulatory Visit (INDEPENDENT_AMBULATORY_CARE_PROVIDER_SITE_OTHER): Payer: BLUE CROSS/BLUE SHIELD | Admitting: Ophthalmology

## 2017-12-26 ENCOUNTER — Encounter (INDEPENDENT_AMBULATORY_CARE_PROVIDER_SITE_OTHER): Payer: Self-pay | Admitting: Ophthalmology

## 2017-12-26 DIAGNOSIS — H35413 Lattice degeneration of retina, bilateral: Secondary | ICD-10-CM

## 2017-12-26 DIAGNOSIS — E113293 Type 2 diabetes mellitus with mild nonproliferative diabetic retinopathy without macular edema, bilateral: Secondary | ICD-10-CM

## 2017-12-26 DIAGNOSIS — I10 Essential (primary) hypertension: Secondary | ICD-10-CM

## 2017-12-26 DIAGNOSIS — H33323 Round hole, bilateral: Secondary | ICD-10-CM

## 2017-12-26 DIAGNOSIS — H35033 Hypertensive retinopathy, bilateral: Secondary | ICD-10-CM

## 2017-12-26 DIAGNOSIS — H25813 Combined forms of age-related cataract, bilateral: Secondary | ICD-10-CM

## 2017-12-26 DIAGNOSIS — H3581 Retinal edema: Secondary | ICD-10-CM

## 2017-12-27 ENCOUNTER — Encounter (INDEPENDENT_AMBULATORY_CARE_PROVIDER_SITE_OTHER): Payer: Self-pay | Admitting: Ophthalmology

## 2018-01-08 NOTE — Progress Notes (Addendum)
[facility] Note  01/09/2018     CHIEF COMPLAINT Patient presents for Retina Follow Up   HISTORY OF PRESENT ILLNESS: Haley Mendez is a 62 y.o. female who presents to the clinic today for:   HPI    Retina Follow Up    Patient presents with  Other.  In both eyes.  This started 2 weeks ago.  Severity is mild.  Since onset it is stable.  I, the attending physician,  performed the HPI with the patient and updated documentation appropriately.          Comments    F/U Lattice degen. OU. Patient stets her vision is "about the same", pt states yesterday her Os was more blurry than usual. Pt states she was seeing a lash in OD, "but it wasn't, maybe it was trash". Pt completed PF as instructed.BS 155 (01/08/18), pt states BS fluctuates, sometimes vision becomes " blurry when BS are high", Pt reports she does not monitor her BS QD. Pt is ready for tx today if indicted.       Last edited by Bernarda Caffey, MD on 01/09/2018  2:40 PM. (History)    Pt states she was seen for the first time at Dr. Patrici Ranks office for routine eye exam; Pt states she has had occasional floaters and flashes OU;   Referring physician: Charlott Rakes, MD [place], [place] [id number]  HISTORICAL INFORMATION:   Selected notes from the [facility] Referred by A. Lundquist, PA for concern of supeiror lattice degeneration OU and retinal hole OU LEE: 08.13.19 (A. Lundquist) [BCVA OD: 20/20 OS: 20/20] Ocular Hx-melanosis, cataract OU, DES OU PMH-DM (taking Metformin, Jardiance, glipizide, last A1C - 7.6), HTN, high cholesterol, arthritis    CURRENT MEDICATIONS: Current Outpatient Medications (Ophthalmic Drugs)  Medication Sig  . prednisoLONE acetate (PRED FORTE) 1 % ophthalmic suspension Place 1 drop into the left eye 4 (four) times daily for 7 days.   No current facility-administered medications for this visit.  (Ophthalmic Drugs)   Current Outpatient Medications  (Other)  Medication Sig  . Blood Glucose Monitoring Suppl (ONE TOUCH ULTRA 2) w/Device KIT BS check BID for E11.9  . buPROPion (WELLBUTRIN SR) 150 MG 12 hr tablet Take 1 tablet (150 mg total) by mouth 2 (two) times daily.  . empagliflozin (JARDIANCE) 25 MG TABS tablet Take 25 mg by mouth daily.  Marland Kitchen glipiZIDE (GLUCOTROL XL) 10 MG 24 hr tablet Take 2 tablets (20 mg total) by mouth daily with breakfast.  . glucose blood (ONE TOUCH TEST STRIPS) test strip Use to check blood sugar daily Dx E11.9  . INVOKANA 100 MG TABS tablet   . lisinopril-hydrochlorothiazide (PRINZIDE,ZESTORETIC) 20-25 MG tablet Take 1 tablet by mouth daily.  . metFORMIN (GLUCOPHAGE) 500 MG tablet Take 1 tablet (500 mg total) by mouth 2 (two) times daily with a meal.  . omeprazole (PRILOSEC) 40 MG capsule TAKE 1 CAPSULE BY MOUTH EVERY DAY  . ONETOUCH DELICA LANCETS FINE MISC 1 each by Does not apply route daily. Dx 250.00  . simvastatin (ZOCOR) 40 MG tablet Take 1 tablet (40 mg total) by mouth at bedtime.   No current facility-administered medications for this visit.  (Other)      REVIEW OF SYSTEMS: ROS    Positive for: Endocrine, Eyes   Negative for: Constitutional, Gastrointestinal, Neurological, Skin, Genitourinary, Musculoskeletal, HENT, Cardiovascular, Respiratory, Psychiatric, Allergic/Imm, Heme/Lymph   Last edited by Zenovia Jordan, LPN on 0/86/7619  5:09 AM. (  History)       ALLERGIES Allergies  Allergen Reactions  . Align Prebiotic-Probiotic     PAST MEDICAL HISTORY Past Medical History:  Diagnosis Date  . Arthritis   . Bone spur of other site    left rotator cuff  . Diabetes mellitus without complication ([place])   . History of colon polyps   . Hyperlipidemia   . Hypertension    History reviewed. No pertinent surgical history.  FAMILY HISTORY Family History  Problem Relation Age of Onset  . Early death Mother   . Diabetes Father   . Hypertension Father   . Diabetes Brother   . Diabetes  Brother   . Amblyopia Neg Hx   . Blindness Neg Hx   . Cataracts Neg Hx   . Glaucoma Neg Hx   . Macular degeneration Neg Hx   . Retinal detachment Neg Hx   . Strabismus Neg Hx   . Retinitis pigmentosa Neg Hx     SOCIAL HISTORY Social History   Tobacco Use  . Smoking status: Current Every Day Smoker    Packs/day: 0.50    Years: 15.00    Pack years: 7.50    Types: Cigarettes  . Smokeless tobacco: Never Used  Substance Use Topics  . Alcohol use: Yes    Alcohol/week: 2.0 standard drinks    Types: 2 Cans of beer per week    Comment: occasional  . Drug use: No         OPHTHALMIC EXAM:  Base Eye Exam    Visual Acuity (Snellen - Linear)      Right Left   Dist [place] 20/25 20/25 +1   Dist ph [place] 20/20 -1 20/25 +2       Tonometry (Tonopen, 9:47 AM)      Right Left   Pressure 18 16       Pupils      Dark Light Shape React APD   Right 4 3 Round Brisk None   Left 4 3 Round Brisk None       Visual Fields (Counting fingers)      Left Right    Full Full       Extraocular Movement      Right Left    Full, Ortho Full, Ortho       Neuro/Psych    Oriented x3:  Yes   Mood/Affect:  Normal       Dilation    Both eyes:  1.0% Mydriacyl, 2.5% Phenylephrine @ 9:47 AM        Slit Lamp and Fundus Exam    Slit Lamp Exam      Right Left   Lids/Lashes Dermatochalasis - upper lid, Meibomian gland dysfunction Dermatochalasis - upper lid, Meibomian gland dysfunction   Conjunctiva/Sclera Melanosis Melanosis   Cornea Arcus Arcus   Anterior Chamber Moderate depth, Temporal Narrow angle Moderate depth, Temporal Narrow angle   Iris Round and moderately dilated to 28m Round and moderately dilated to 662m  Lens 2+ Nuclear sclerosis, 2+ Cortical cataract 2+ Nuclear sclerosis, 2+ Cortical cataract   Vitreous Vitreous syneresis Vitreous syneresis       Fundus Exam      Right Left   Disc Pink and Sharp Pink and Sharp   C/D Ratio 0.4 0.4   Macula Good foveal reflex, Retinal pigment  epithelial mottling, No heme or edema Good foveal reflex, Retinal pigment epithelial mottling, No heme or edema   Vessels Copper wiring, AV crossing changes Copper wiring,  AV crossing changes   Periphery Attached, pigmented Lattice degeneration from 1100 to 1200 with atrophic holes -- good early laser surrounding, blot hemorrhage at 0500, pigmented lattice with atrophic hole and overlying vitreous syneresis at 0730 -- good early laser posteriorly -- room for fill in, pigmented lattice at 1030 -- good early laser surrounding Attached, pigmented Lattice degeneration at 1200, blot hemorrhage at 0300, small patch of pigmented lattice at 0600, pigmented patch of lattice at 0700, very small patch of lattice at 1030 equator          IMAGING AND PROCEDURES  Imaging and Procedures for _0 @  OCT, Retina - OU - Both Eyes       Right Eye Quality was good. Central Foveal Thickness: 288. Progression has been stable. Findings include normal foveal contour, no IRF, no SRF, vitreomacular adhesion  (New CWS along proximal ST arcade).   Left Eye Quality was good. Central Foveal Thickness: 271. Progression has been stable. Findings include normal foveal contour, no IRF, no SRF, vitreomacular adhesion .   Notes *Images captured and stored on drive  Diagnosis / Impression:  NFP, No IRF/SRF, VMA  Clinical management:  See below  Abbreviations: NFP - Normal foveal profile. CME - cystoid macular edema. PED - pigment epithelial detachment. IRF - intraretinal fluid. SRF - subretinal fluid. EZ - ellipsoid zone. ERM - epiretinal membrane. ORA - outer retinal atrophy. ORT - outer retinal tubulation. SRHM - subretinal hyper-reflective material         Repair Retinal Breaks, Laser - OS - Left Eye       LASER PROCEDURE NOTE  Procedure:  Barrier laser retinopexy using slit lamp laser, LEFT eye   Diagnosis:   Lattice degeneration and retinal breaks, LEFT eye                     Lattice at 12, 430, 600,  700                         Retinal hole at 1                         Retinal break at 1030  Surgeon: Bernarda Caffey, MD, PhD  Anesthesia: Topical, subconjunctival block  Informed consent obtained, operative eye marked, and time out performed prior to initiation of laser.   Laser settings:  Lumenis [id number] laser, slit lamp Lens: [place] PRP 165 Power: 270 mW Spot size: 200 microns Duration: 30 msec  # spots: 587  Placement of laser: Using a [place] PRP 165 contact lens at the slit lamp, laser was placed in three confluent rows around flap tear at 1 oclock anterior to equator with additional rows anteriorly.  Complications: None.  Patient tolerated the procedure much better with subconj anesthesia and received written and verbal post-procedure care information/education.                 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 Retinal edema H35.81 OCT, Retina - OU - Both Eyes  4. Mild nonproliferative diabetic retinopathy of both eyes without macular edema associated with type 2 diabetes mellitus ([place]) Y60.6301   5. Essential hypertension I10   6. Hypertensive retinopathy  of both eyes H35.033   7. Combined forms of age-related cataract of both eyes H25.813     1,2. Lattice degeneration w/ atrophic holes, OU - OD: pigmented patches from 1100 to 1200 with atrophic holes, patch at 0730, and 1030 - OS: pigmented lattice at 1200, 0600, 0700, 1030 equator; round atrophic hole at 0100 - s/p laser retinopexy OD (08.15.19), S/P touch up laser retinopexy OD (08.29.19) -- good laser changes around all lesions OD - recommend laser retinopexy OS w/ subconj lido - pt wishes to proceed with laser retinopexy OS today (09.12.19) - RBA of procedure discussed, questions answered -  informed consent obtained and signed - see procedure note - start PF OS QID x 7 days - f/u in 2-3 wks  3,4. Mild nonproliferative diabetic retinopathy w/o DME, both eyes - The incidence, risk factors for progression, natural history and treatment options for diabetic retinopathy were discussed with patient.   - The need for close monitoring of blood glucose, blood pressure, and serum lipids, avoiding cigarette or any type of tobacco, and the need for long term follow up was also discussed with patient. - exam without macular pathology but scattered DBH in periphery OU   - OCT without diabetic macular edema, both eyes - f/u in 1 year, sooner prn  5,6. Hypertensive retinopathy OU - discussed importance of tight BP control - monitor  7. Combined form age related cataract OU-  - The symptoms of cataract, surgical options, and treatments and risks were discussed with patient. - discussed diagnosis and progression - not yet visually significant - monitor for now   Ophthalmic Meds Ordered this visit:  Meds ordered this encounter  Medications  . prednisoLONE acetate (PRED FORTE) 1 % ophthalmic suspension    Sig: Place 1 drop into the left eye 4 (four) times daily for 7 days.    Dispense:  10 mL    Refill:  0       Return in about 3 weeks (around 01/30/2018) for F/U laser ret OU, DFE, OCT.  There are no Patient Instructions on file for this visit.   Explained the diagnoses, plan, and follow up with the patient and they expressed understanding.  Patient expressed understanding of the importance of proper follow up care.   This document serves as a record of services personally performed by Gardiner Sleeper, MD, PhD. It was created on their behalf by Catha Brow, [place], a certified ophthalmic assistant. The creation of this record is the provider's dictation and/or activities during the visit.  Electronically signed by: Catha Brow, COA  09.11.19 4:16 PM    Gardiner Sleeper,  M.D., Ph.D. Diseases & Surgery of the Retina and Vitreous [facility]  I have reviewed the above documentation for accuracy and completeness, and I agree with the above. Gardiner Sleeper, M.D., Ph.D. 01/09/18 4:16 PM     Abbreviations: M myopia (nearsighted); A astigmatism; H hyperopia (farsighted); P presbyopia; Mrx spectacle prescription;  CTL contact lenses; OD right eye; OS left eye; OU both eyes  XT exotropia; ET esotropia; PEK punctate epithelial keratitis; PEE punctate epithelial erosions; DES dry eye syndrome; MGD meibomian gland dysfunction; ATs artificial tears; PFAT's preservative free artificial tears; [place] nuclear sclerotic cataract; PSC posterior subcapsular cataract; ERM epi-retinal membrane; PVD posterior vitreous detachment; RD retinal detachment; DM diabetes mellitus; DR diabetic retinopathy; NPDR non-proliferative diabetic retinopathy; PDR proliferative diabetic retinopathy; CSME clinically significant macular edema; DME diabetic macular edema; dbh dot blot hemorrhages; CWS cotton  wool spot; POAG primary open angle glaucoma; C/D cup-to-disc ratio; HVF humphrey visual field; GVF goldmann visual field; OCT optical coherence tomography; IOP intraocular pressure; BRVO Branch retinal vein occlusion; CRVO central retinal vein occlusion; CRAO central retinal artery occlusion; BRAO branch retinal artery occlusion; RT retinal tear; SB scleral buckle; PPV pars plana vitrectomy; VH Vitreous hemorrhage; PRP panretinal laser photocoagulation; IVK intravitreal kenalog; VMT vitreomacular traction; MH Macular hole;  NVD neovascularization of the disc; NVE neovascularization elsewhere; AREDS age related eye disease study; ARMD age related macular degeneration; POAG primary open angle glaucoma; EBMD epithelial/anterior basement membrane dystrophy; ACIOL anterior chamber intraocular lens; IOL intraocular lens; PCIOL posterior chamber intraocular lens; Phaco/IOL phacoemulsification with  intraocular lens placement; [place] photorefractive keratectomy; LASIK laser assisted in situ keratomileusis; HTN hypertension; DM diabetes mellitus; COPD chronic obstructive pulmonary disease

## 2018-01-09 ENCOUNTER — Ambulatory Visit (INDEPENDENT_AMBULATORY_CARE_PROVIDER_SITE_OTHER): Payer: BLUE CROSS/BLUE SHIELD | Admitting: Ophthalmology

## 2018-01-09 ENCOUNTER — Encounter (INDEPENDENT_AMBULATORY_CARE_PROVIDER_SITE_OTHER): Payer: Self-pay | Admitting: Ophthalmology

## 2018-01-09 DIAGNOSIS — H25813 Combined forms of age-related cataract, bilateral: Secondary | ICD-10-CM

## 2018-01-09 DIAGNOSIS — I10 Essential (primary) hypertension: Secondary | ICD-10-CM

## 2018-01-09 DIAGNOSIS — H33323 Round hole, bilateral: Secondary | ICD-10-CM | POA: Diagnosis not present

## 2018-01-09 DIAGNOSIS — H35413 Lattice degeneration of retina, bilateral: Secondary | ICD-10-CM

## 2018-01-09 DIAGNOSIS — E113293 Type 2 diabetes mellitus with mild nonproliferative diabetic retinopathy without macular edema, bilateral: Secondary | ICD-10-CM

## 2018-01-09 DIAGNOSIS — H3581 Retinal edema: Secondary | ICD-10-CM

## 2018-01-09 DIAGNOSIS — H35033 Hypertensive retinopathy, bilateral: Secondary | ICD-10-CM

## 2018-01-09 MED ORDER — PREDNISOLONE ACETATE 1 % OP SUSP: 1.0000 [drp] | Freq: Four times a day (QID) | OPHTHALMIC | 0 refills | Status: AC

## 2018-02-04 ENCOUNTER — Encounter (INDEPENDENT_AMBULATORY_CARE_PROVIDER_SITE_OTHER): Payer: BLUE CROSS/BLUE SHIELD | Admitting: Ophthalmology

## 2018-02-17 NOTE — Progress Notes (Addendum)
[facility] Note  02/18/2018     CHIEF COMPLAINT Patient presents for Retina Follow Up   HISTORY OF PRESENT ILLNESS: Haley Mendez is a 62 y.o. female who presents to the clinic today for:   HPI    Retina Follow Up    In both eyes.  This started 3 weeks ago.  Severity is mild.  Since onset it is gradually improving.  I, the attending physician,  performed the HPI with the patient and updated documentation appropriately.          Comments    F/U Laser retinopexy OS (01/09/18). Patient states she continues  to see "a lash floating around " OU. Pt states her vision has improved. Bs are not monitored at home, denies hypo/hyperglycemic episodes.        Last edited by Bernarda Caffey, MD on 02/18/2018 10:44 AM. (History)    pt states her vision has gotten better, she has a few floaters every now and then, pt states she has been using Visine, pt states she used the PF as directed and then used the Visine  Referring physician: Charlott Rakes, MD [place], [place] [address]  HISTORICAL INFORMATION:   Selected notes from the [place] Referred by A. Lundquist, PA for concern of supeiror lattice degeneration OU and retinal hole OU LEE: 08.13.19 (A. Lundquist) [BCVA OD: 20/20 OS: 20/20] Ocular Hx-melanosis, cataract OU, DES OU PMH-DM (taking Metformin, Jardiance, glipizide, last A1C - 7.6), HTN, high cholesterol, arthritis    CURRENT MEDICATIONS: No current outpatient medications on file. (Ophthalmic Drugs)   No current facility-administered medications for this visit.  (Ophthalmic Drugs)   Current Outpatient Medications (Other)  Medication Sig  . Blood Glucose Monitoring Suppl (ONE TOUCH ULTRA 2) w/Device KIT BS check BID for E11.9  . buPROPion (WELLBUTRIN SR) 150 MG 12 hr tablet Take 1 tablet (150 mg total) by mouth 2 (two) times daily.  . empagliflozin (JARDIANCE) 25 MG TABS tablet Take 25 mg by mouth daily.  [place] Kitchen glipiZIDE  (GLUCOTROL XL) 10 MG 24 hr tablet Take 2 tablets (20 mg total) by mouth daily with breakfast.  . glucose blood (ONE TOUCH TEST STRIPS) test strip Use to check blood sugar daily Dx E11.9  . INVOKANA 100 MG TABS tablet   . lisinopril-hydrochlorothiazide (PRINZIDE,ZESTORETIC) 20-25 MG tablet Take 1 tablet by mouth daily.  . metFORMIN (GLUCOPHAGE) 500 MG tablet Take 1 tablet (500 mg total) by mouth 2 (two) times daily with a meal.  . omeprazole (PRILOSEC) 40 MG capsule TAKE 1 CAPSULE BY MOUTH EVERY DAY  . ONETOUCH DELICA LANCETS FINE MISC 1 each by Does not apply route daily. Dx 250.00  . simvastatin (ZOCOR) 40 MG tablet Take 1 tablet (40 mg total) by mouth at bedtime.   No current facility-administered medications for this visit.  (Other)      REVIEW OF SYSTEMS: ROS    Positive for: Endocrine, Eyes   Negative for: Constitutional, Gastrointestinal, Neurological, Skin, Genitourinary, Musculoskeletal, HENT, Cardiovascular, Respiratory, Psychiatric, Allergic/Imm, Heme/Lymph   Last edited by Zenovia Jordan, LPN on 38/75/6433  2:95 AM. (History)       ALLERGIES Allergies  Allergen Reactions  . Align Prebiotic-Probiotic     PAST MEDICAL HISTORY Past Medical History:  Diagnosis Date  . Arthritis   . Bone spur of other site    left rotator cuff  . Diabetes mellitus without complication ([place])   . History of colon polyps   .  Hyperlipidemia   . Hypertension    History reviewed. No pertinent surgical history.  FAMILY HISTORY Family History  Problem Relation Age of Onset  . Early death Mother   . Diabetes Father   . Hypertension Father   . Diabetes Brother   . Diabetes Brother   . Amblyopia Neg Hx   . Blindness Neg Hx   . Cataracts Neg Hx   . Glaucoma Neg Hx   . Macular degeneration Neg Hx   . Retinal detachment Neg Hx   . Strabismus Neg Hx   . Retinitis pigmentosa Neg Hx     SOCIAL HISTORY Social History   Tobacco Use  . Smoking status: Current Every Day Smoker     Packs/day: 0.50    Years: 15.00    Pack years: 7.50    Types: Cigarettes  . Smokeless tobacco: Never Used  Substance Use Topics  . Alcohol use: Yes    Alcohol/week: 2.0 standard drinks    Types: 2 Cans of beer per week    Comment: occasional  . Drug use: No         OPHTHALMIC EXAM:  Base Eye Exam    Visual Acuity (Snellen - Linear)      Right Left   Dist [place] 20/20 -1 20/20 -2   Dist ph [place] NI 20/20 -1       Tonometry (Tonopen, 10:01 AM)      Right Left   Pressure 16 14       Pupils      Dark Light Shape React APD   Right 4 3 Round Brisk None   Left 4 3 Round Brisk None       Visual Fields (Counting fingers)      Left Right    Full Full       Extraocular Movement      Right Left    Full, Ortho Full, Ortho       Neuro/Psych    Oriented x3:  Yes   Mood/Affect:  Normal       Dilation    Both eyes:  1.0% Mydriacyl, 2.5% Phenylephrine @ 10:01 AM        Slit Lamp and Fundus Exam    Slit Lamp Exam      Right Left   Lids/Lashes Dermatochalasis - upper lid, Meibomian gland dysfunction Dermatochalasis - upper lid, Meibomian gland dysfunction   Conjunctiva/Sclera Melanosis Melanosis   Cornea Arcus Arcus   Anterior Chamber Moderate depth, Temporal Narrow angle Moderate depth, Temporal Narrow angle   Iris Round and moderately dilated to 2m Round and moderately dilated to 621m  Lens 2+ Nuclear sclerosis, 2+ Cortical cataract 2+ Nuclear sclerosis, 2+ Cortical cataract   Vitreous Vitreous syneresis Vitreous syneresis       Fundus Exam      Right Left   Disc Pink and Sharp Pink and Sharp   C/D Ratio 0.4 0.4   Macula Good foveal reflex, Retinal pigment epithelial mottling, No heme or edema Good foveal reflex, Retinal pigment epithelial mottling, No heme or edema   Vessels Copper wiring, AV crossing changes Copper wiring, AV crossing changes   Periphery Attached, pigmented Lattice degeneration from 1100 to 1200 with atrophic holes -- good laser surrounding, blot  hemorrhage at 0500 improved, pigmented lattice with atrophic hole and overlying vitreous syneresis at 0730 and 1030 -- good laser surrounding Attached, pigmented Lattice degeneration at 1200, blot hemorrhage at 0300, small patch of pigmented lattice at 0600, pigmented patch of  lattice at 0730, very small patch of lattice at 1030 equator -- good early laser changes surrounding all lesions ?0730 patch may need touch up          IMAGING AND PROCEDURES  Imaging and Procedures for @TODAY @          ASSESSMENT/PLAN:    ICD-10-CM   1. Lattice degeneration of both retinas H35.413   2. 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 Retinal edema H35.81   4. 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 Essential hypertension I10   6. Hypertensive retinopathy of both eyes H35.033   7. Combined forms of age-related cataract of both eyes H25.813     1,2. Lattice degeneration w/ atrophic holes, OU - OD: pigmented patches from 1100 to 1200 with atrophic holes, patch at 0730, and 1030 - OS: pigmented lattice at 1200, 0600, 0730, 1030 equator; round atrophic hole at 0100 - s/p laser retinopexy OD (08.15.19), S/P touch up laser retinopexy OD (08.29.19) -- good laser changes around all lesions OD - s/p laser retinopexy OS (09.12.19) -- good early laser in place, ?730 patch may need touch up - f/u 4-6 wks  3,4. Mild nonproliferative diabetic retinopathy w/o DME, both eyes - The incidence, risk factors for progression, natural history and treatment options for diabetic retinopathy were discussed with patient.   - The need for close monitoring of blood glucose, blood pressure, and serum lipids, avoiding cigarette or any type of tobacco, and the need for long term follow up was also discussed with patient. - exam without macular pathology but scattered DBH in periphery OU   - OCT without diabetic macular edema, both  eyes - f/u in 1 year, sooner prn  5,6. Hypertensive retinopathy OU - discussed importance of tight BP control - monitor  7. Combined form age related cataract OU-  - The symptoms of cataract, surgical options, and treatments and risks were discussed with patient. - discussed diagnosis and progression - not yet visually significant - monitor for now   Ophthalmic Meds Ordered this visit:  No orders of the defined types were placed in this encounter.      Return for F/U lattice degeneration 4-6 weeks, possible laser OS.  There are no Patient Instructions on file for this visit.   Explained the diagnoses, plan, and follow up with the patient and they expressed understanding.  Patient expressed understanding of the importance of proper follow up care.   This document serves as a record of services personally performed by Gardiner Sleeper, MD, PhD. It was created on their behalf by Ernest Mallick, OA, an ophthalmic assistant. The creation of this record is the provider's dictation and/or activities during the visit.    Electronically signed by: Ernest Mallick, OA  10.21.19 7:58 AM    Gardiner Sleeper, M.D., Ph.D. Diseases & Surgery of the Retina and Vitreous [facility]   I have reviewed the above documentation for accuracy and completeness, and I agree with the above. Gardiner Sleeper, M.D., Ph.D. 02/19/18 7:58 AM     Abbreviations: M myopia (nearsighted); A astigmatism; H hyperopia (farsighted); P presbyopia; Mrx spectacle prescription;  CTL contact lenses; OD right eye; OS left eye; OU both eyes  XT exotropia; ET esotropia; PEK punctate epithelial keratitis; PEE punctate epithelial erosions; DES dry eye syndrome; MGD meibomian gland dysfunction; ATs artificial tears; PFAT's preservative free artificial tears; [place] nuclear sclerotic  cataract; PSC posterior subcapsular cataract; ERM epi-retinal membrane; PVD posterior vitreous detachment; RD retinal detachment; DM  diabetes mellitus; DR diabetic retinopathy; NPDR non-proliferative diabetic retinopathy; PDR proliferative diabetic retinopathy; CSME clinically significant macular edema; DME diabetic macular edema; dbh dot blot hemorrhages; CWS cotton wool spot; POAG primary open angle glaucoma; C/D cup-to-disc ratio; HVF humphrey visual field; GVF goldmann visual field; OCT optical coherence tomography; IOP intraocular pressure; BRVO Branch retinal vein occlusion; CRVO central retinal vein occlusion; CRAO central retinal artery occlusion; BRAO branch retinal artery occlusion; RT retinal tear; SB scleral buckle; PPV pars plana vitrectomy; VH Vitreous hemorrhage; PRP panretinal laser photocoagulation; IVK intravitreal kenalog; VMT vitreomacular traction; MH Macular hole;  NVD neovascularization of the disc; NVE neovascularization elsewhere; AREDS age related eye disease study; ARMD age related macular degeneration; POAG primary open angle glaucoma; EBMD epithelial/anterior basement membrane dystrophy; ACIOL anterior chamber intraocular lens; IOL intraocular lens; PCIOL posterior chamber intraocular lens; Phaco/IOL phacoemulsification with intraocular lens placement; [place] photorefractive keratectomy; LASIK laser assisted in situ keratomileusis; HTN hypertension; DM diabetes mellitus; COPD chronic obstructive pulmonary disease

## 2018-02-18 ENCOUNTER — Ambulatory Visit (INDEPENDENT_AMBULATORY_CARE_PROVIDER_SITE_OTHER): Payer: BLUE CROSS/BLUE SHIELD | Admitting: Ophthalmology

## 2018-02-18 ENCOUNTER — Encounter (INDEPENDENT_AMBULATORY_CARE_PROVIDER_SITE_OTHER): Payer: Self-pay | Admitting: Ophthalmology

## 2018-02-18 DIAGNOSIS — I10 Essential (primary) hypertension: Secondary | ICD-10-CM

## 2018-02-18 DIAGNOSIS — H25813 Combined forms of age-related cataract, bilateral: Secondary | ICD-10-CM

## 2018-02-18 DIAGNOSIS — H35413 Lattice degeneration of retina, bilateral: Secondary | ICD-10-CM

## 2018-02-18 DIAGNOSIS — H3581 Retinal edema: Secondary | ICD-10-CM

## 2018-02-18 DIAGNOSIS — H35033 Hypertensive retinopathy, bilateral: Secondary | ICD-10-CM

## 2018-02-18 DIAGNOSIS — E113293 Type 2 diabetes mellitus with mild nonproliferative diabetic retinopathy without macular edema, bilateral: Secondary | ICD-10-CM

## 2018-02-18 DIAGNOSIS — H33323 Round hole, bilateral: Secondary | ICD-10-CM

## 2018-02-19 ENCOUNTER — Encounter (INDEPENDENT_AMBULATORY_CARE_PROVIDER_SITE_OTHER): Payer: Self-pay | Admitting: Ophthalmology

## 2018-04-01 ENCOUNTER — Encounter (INDEPENDENT_AMBULATORY_CARE_PROVIDER_SITE_OTHER): Payer: BLUE CROSS/BLUE SHIELD | Admitting: Ophthalmology

## 2018-04-03 ENCOUNTER — Ambulatory Visit: Payer: BLUE CROSS/BLUE SHIELD | Attending: Family Medicine | Admitting: Family Medicine

## 2018-04-03 ENCOUNTER — Encounter: Payer: Self-pay | Admitting: Family Medicine

## 2018-04-03 VITALS — BP 96/54 | HR 71 | Temp 98.2°F | Ht 63.0 in | Wt 132.0 lb

## 2018-04-03 DIAGNOSIS — E1100 Type 2 diabetes mellitus with hyperosmolarity without nonketotic hyperglycemic-hyperosmolar coma (NKHHC): Secondary | ICD-10-CM | POA: Diagnosis not present

## 2018-04-03 DIAGNOSIS — Z7984 Long term (current) use of oral hypoglycemic drugs: Secondary | ICD-10-CM | POA: Insufficient documentation

## 2018-04-03 DIAGNOSIS — Z794 Long term (current) use of insulin: Secondary | ICD-10-CM

## 2018-04-03 DIAGNOSIS — I10 Essential (primary) hypertension: Secondary | ICD-10-CM | POA: Diagnosis not present

## 2018-04-03 DIAGNOSIS — E7800 Pure hypercholesterolemia, unspecified: Secondary | ICD-10-CM

## 2018-04-03 DIAGNOSIS — Z79899 Other long term (current) drug therapy: Secondary | ICD-10-CM | POA: Insufficient documentation

## 2018-04-03 LAB — GLUCOSE, POCT (MANUAL RESULT ENTRY): POC Glucose: 298 mg/dl — AB (ref 70–99)

## 2018-04-03 LAB — POCT GLYCOSYLATED HEMOGLOBIN (HGB A1C): Hemoglobin A1C: 8.4 % — AB (ref 4.0–5.6)

## 2018-04-03 MED ORDER — INSULIN GLARGINE 100 UNIT/ML SOLOSTAR PEN: 15.0000 [IU] | PEN_INJECTOR | Freq: Every day | SUBCUTANEOUS | 6 refills | Status: DC

## 2018-04-03 MED ORDER — SIMVASTATIN 40 MG PO TABS: 40.0000 mg | ORAL_TABLET | Freq: Every day | ORAL | 6 refills | Status: DC

## 2018-04-03 MED ORDER — GLIPIZIDE ER 10 MG PO TB24: 20.0000 mg | ORAL_TABLET | Freq: Every day | ORAL | 6 refills | Status: DC

## 2018-04-03 MED ORDER — LISINOPRIL-HYDROCHLOROTHIAZIDE 20-25 MG PO TABS: 1.0000 | ORAL_TABLET | Freq: Every day | ORAL | 6 refills | Status: DC

## 2018-04-03 MED ORDER — EMPAGLIFLOZIN 25 MG PO TABS: 25.0000 mg | ORAL_TABLET | Freq: Every day | ORAL | 6 refills | Status: DC

## 2018-04-03 NOTE — Progress Notes (Signed)
Subjective:  Patient ID: Haley Mendez, female    DOB: June 29, 1955  Age: 63 y.o. MRN: [id number]  CC: Diabetes   HPI Haley Mendez  is a 62 year old female with a history of type 2 diabetes mellitus (A1c 8.4), hypertension, hyperlipidemia who presents today for a follow-up visit. Her diabetes is uncontrolled despite compliance with Jardiance, glipizide.  She denies hypoglycemia, visual concerns, numbness in extremity and has tolerated her antihypertensive with no complaints of adverse effect. Also doing a statin with no complaints of myalgias. Denies additional concerns at this time.  Past Medical History:  Diagnosis Date  . Arthritis   . Bone spur of other site    left rotator cuff  . Diabetes mellitus without complication ([place])   . History of colon polyps   . Hyperlipidemia   . Hypertension     No past surgical history on file.  Allergies  Allergen Reactions  . Acidophilus      Outpatient Medications Prior to Visit  Medication Sig Dispense Refill  . empagliflozin (JARDIANCE) 25 MG TABS tablet Take 25 mg by mouth daily. 30 tablet 6  . glipiZIDE (GLUCOTROL XL) 10 MG 24 hr tablet Take 2 tablets (20 mg total) by mouth daily with breakfast. 60 tablet 6  . lisinopril-hydrochlorothiazide (PRINZIDE,ZESTORETIC) 20-25 MG tablet Take 1 tablet by mouth daily. 30 tablet 6  . metFORMIN (GLUCOPHAGE) 500 MG tablet Take 1 tablet (500 mg total) by mouth 2 (two) times daily with a meal. 60 tablet 6  . simvastatin (ZOCOR) 40 MG tablet Take 1 tablet (40 mg total) by mouth at bedtime. 30 tablet 6  . Blood Glucose Monitoring Suppl (ONE TOUCH ULTRA 2) w/Device KIT BS check BID for E11.9 (Patient not taking: Reported on 04/03/2018) 1 each 0  . buPROPion (WELLBUTRIN SR) 150 MG 12 hr tablet Take 1 tablet (150 mg total) by mouth 2 (two) times daily. (Patient not taking: Reported on 04/03/2018) 60 tablet 3  . glucose blood (ONE TOUCH TEST STRIPS) test strip Use to check blood sugar daily Dx E11.9  (Patient not taking: Reported on 04/03/2018) 100 each 3  . omeprazole (PRILOSEC) 40 MG capsule TAKE 1 CAPSULE BY MOUTH EVERY DAY (Patient not taking: Reported on 04/03/2018) 30 capsule 0  . ONETOUCH DELICA LANCETS FINE MISC 1 each by Does not apply route daily. Dx 250.00 (Patient not taking: Reported on 04/03/2018) 100 each 5  . INVOKANA 100 MG TABS tablet   5   No facility-administered medications prior to visit.     ROS Review of Systems  Constitutional: Negative for activity change, appetite change and fatigue.  HENT: Negative for congestion, sinus pressure and sore throat.   Eyes: Negative for visual disturbance.  Respiratory: Negative for cough, chest tightness, shortness of breath and wheezing.   Cardiovascular: Negative for chest pain and palpitations.  Gastrointestinal: Negative for abdominal distention, abdominal pain and constipation.  Endocrine: Negative for polydipsia.  Genitourinary: Negative for dysuria and frequency.  Musculoskeletal: Negative for arthralgias and back pain.  Skin: Negative for rash.  Neurological: Negative for tremors, light-headedness and numbness.  Hematological: Does not bruise/bleed easily.  Psychiatric/Behavioral: Negative for agitation and behavioral problems.    Objective:  BP (!) 96/54   Pulse 71   Temp 98.2 F (36.8 C) (Oral)   Ht 5' 3"  (1.6 m)   Wt 132 lb (59.9 kg)   SpO2 96%   BMI 23.38 kg/m   BP/Weight 04/03/2018 11/18/2017 2/33/0076  Systolic BP 96 226 333  Diastolic BP 54  73 69  Wt. (Lbs) 132 139 142.4  BMI 23.38 24.62 25.23     Physical Exam  Constitutional: She is oriented to person, place, and time. She appears well-developed and well-nourished.  Cardiovascular: Normal rate, normal heart sounds and intact distal pulses.  No murmur heard. Pulmonary/Chest: Effort normal and breath sounds normal. She has no wheezes. She has no rales. She exhibits no tenderness.  Abdominal: Soft. Bowel sounds are normal. She exhibits no  distension and no mass. There is no tenderness.  Musculoskeletal: Normal range of motion.  Neurological: She is alert and oriented to person, place, and time.  Psychiatric: She has a normal mood and affect.     CMP Latest Ref Rng & Units 11/19/2017 05/29/2017 03/16/2016  Glucose 65 - 99 mg/dL 67 118(H) 115(H)  BUN 8 - 27 mg/dL 20 17 16   Creatinine 0.57 - 1.00 mg/dL 0.90 0.79 0.80  Sodium 134 - 144 mmol/L 141 142 135  Potassium 3.5 - 5.2 mmol/L 4.7 4.8 4.1  Chloride 96 - 106 mmol/L 104 104 104  CO2 20 - 29 mmol/L 21 20 21   Calcium 8.7 - 10.3 mg/dL 9.7 9.7 9.9  Total Protein 6.0 - 8.5 g/dL 6.7 7.0 7.1  Total Bilirubin 0.0 - 1.2 mg/dL <0.2 <0.2 0.2  Alkaline Phos 39 - 117 IU/L 83 92 86  AST 0 - 40 IU/L 14 18 16   ALT 0 - 32 IU/L 16 15 14     Lipid Panel     Component Value Date/Time   CHOL 158 11/19/2017 0854   TRIG 105 11/19/2017 0854   HDL 33 (L) 11/19/2017 0854   CHOLHDL 4.8 (H) 11/19/2017 0854   CHOLHDL 2.8 12/02/2015 0945   VLDL 18 12/02/2015 0945   LDLCALC 104 (H) 11/19/2017 0854    Lab Results  Component Value Date   HGBA1C 8.4 (A) 04/03/2018    Assessment & Plan:   1. Type 2 diabetes mellitus with hyperosmolarity without coma, with long-term current use of insulin (HCC) Controlled with A1c of 8.4 which has increased from 7.6 previously Lantus added to regimen Diabetic diet, lifestyle modifications - POCT glucose (manual entry) - POCT glycosylated hemoglobin (Hb A1C) - Insulin Glargine (LANTUS SOLOSTAR) 100 UNIT/ML Solostar Pen; Inject 15 Units into the skin at bedtime.  Dispense: 5 pen; Refill: 6 - glipiZIDE (GLUCOTROL XL) 10 MG 24 hr tablet; Take 2 tablets (20 mg total) by mouth daily with breakfast.  Dispense: 60 tablet; Refill: 6 - empagliflozin (JARDIANCE) 25 MG TABS tablet; Take 25 mg by mouth daily.  Dispense: 30 tablet; Refill: 6  2. Pure hypercholesterolemia LDL is 104 which is slightly above goal of less than 100 Low-cholesterol diet - simvastatin  (ZOCOR) 40 MG tablet; Take 1 tablet (40 mg total) by mouth at bedtime.  Dispense: 30 tablet; Refill: 6  3. Essential hypertension Controlled Blood pressure is on the hypotensive side We will reduce dose of antihypertensive if still low at next visit - lisinopril-hydrochlorothiazide (PRINZIDE,ZESTORETIC) 20-25 MG tablet; Take 1 tablet by mouth daily.  Dispense: 30 tablet; Refill: 6   Meds ordered this encounter  Medications  . Insulin Glargine (LANTUS SOLOSTAR) 100 UNIT/ML Solostar Pen    Sig: Inject 15 Units into the skin at bedtime.    Dispense:  5 pen    Refill:  6    Discontinue previous dose  . simvastatin (ZOCOR) 40 MG tablet    Sig: Take 1 tablet (40 mg total) by mouth at bedtime.    Dispense:  30  tablet    Refill:  6  . lisinopril-hydrochlorothiazide (PRINZIDE,ZESTORETIC) 20-25 MG tablet    Sig: Take 1 tablet by mouth daily.    Dispense:  30 tablet    Refill:  6    Discontinue lisinopril, discontinue HCTZ  . glipiZIDE (GLUCOTROL XL) 10 MG 24 hr tablet    Sig: Take 2 tablets (20 mg total) by mouth daily with breakfast.    Dispense:  60 tablet    Refill:  6  . empagliflozin (JARDIANCE) 25 MG TABS tablet    Sig: Take 25 mg by mouth daily.    Dispense:  30 tablet    Refill:  6    Discontinue Invokana    Follow-up: Return in about 3 months (around 07/03/2018) for follow up of chronic medical conditions.   Charlott Rakes MD

## 2018-04-03 NOTE — Patient Instructions (Signed)
Diabetes Mellitus and Nutrition When you have diabetes (diabetes mellitus), it is very important to have healthy eating habits because your blood sugar (glucose) levels are greatly affected by what you eat and drink. Eating healthy foods in the appropriate amounts, at about the same times every day, can help you:  Control your blood glucose.  Lower your risk of heart disease.  Improve your blood pressure.  Reach or maintain a healthy weight.  Every person with diabetes is different, and each person has different needs for a meal plan. Your health care provider may recommend that you work with a diet and nutrition specialist (dietitian) to make a meal plan that is best for you. Your meal plan may vary depending on factors such as:  The calories you need.  The medicines you take.  Your weight.  Your blood glucose, blood pressure, and cholesterol levels.  Your activity level.  Other health conditions you have, such as heart or kidney disease.  How do carbohydrates affect me? Carbohydrates affect your blood glucose level more than any other type of food. Eating carbohydrates naturally increases the amount of glucose in your blood. Carbohydrate counting is a method for keeping track of how many carbohydrates you eat. Counting carbohydrates is important to keep your blood glucose at a healthy level, especially if you use insulin or take certain oral diabetes medicines. It is important to know how many carbohydrates you can safely have in each meal. This is different for every person. Your dietitian can help you calculate how many carbohydrates you should have at each meal and for snack. Foods that contain carbohydrates include:  Bread, cereal, rice, pasta, and crackers.  Potatoes and corn.  Peas, beans, and lentils.  Milk and yogurt.  Fruit and juice.  Desserts, such as cakes, cookies, ice cream, and candy.  How does alcohol affect me? Alcohol can cause a sudden decrease in blood  glucose (hypoglycemia), especially if you use insulin or take certain oral diabetes medicines. Hypoglycemia can be a life-threatening condition. Symptoms of hypoglycemia (sleepiness, dizziness, and confusion) are similar to symptoms of having too much alcohol. If your health care provider says that alcohol is safe for you, follow these guidelines:  Limit alcohol intake to no more than 1 drink per day for nonpregnant women and 2 drinks per day for men. One drink equals 12 oz of beer, 5 oz of wine, or 1 oz of hard liquor.  Do not drink on an empty stomach.  Keep yourself hydrated with water, diet soda, or unsweetened iced tea.  Keep in mind that regular soda, juice, and other mixers may contain a lot of sugar and must be counted as carbohydrates.  What are tips for following this plan? Reading food labels  Start by checking the serving size on the label. The amount of calories, carbohydrates, fats, and other nutrients listed on the label are based on one serving of the food. Many foods contain more than one serving per package.  Check the total grams (g) of carbohydrates in one serving. You can calculate the number of servings of carbohydrates in one serving by dividing the total carbohydrates by 15. For example, if a food has 30 g of total carbohydrates, it would be equal to 2 servings of carbohydrates.  Check the number of grams (g) of saturated and trans fats in one serving. Choose foods that have low or no amount of these fats.  Check the number of milligrams (mg) of sodium in one serving. Most people   should limit total sodium intake to less than 2,300 mg per day.  Always check the nutrition information of foods labeled as "low-fat" or "nonfat". These foods may be higher in added sugar or refined carbohydrates and should be avoided.  Talk to your dietitian to identify your daily goals for nutrients listed on the label. Shopping  Avoid buying canned, premade, or processed foods. These  foods tend to be high in fat, sodium, and added sugar.  Shop around the outside edge of the grocery store. This includes fresh fruits and vegetables, bulk grains, fresh meats, and fresh dairy. Cooking  Use low-heat cooking methods, such as baking, instead of high-heat cooking methods like deep frying.  Cook using healthy oils, such as olive, canola, or sunflower oil.  Avoid cooking with butter, cream, or high-fat meats. Meal planning  Eat meals and snacks regularly, preferably at the same times every day. Avoid going long periods of time without eating.  Eat foods high in fiber, such as fresh fruits, vegetables, beans, and whole grains. Talk to your dietitian about how many servings of carbohydrates you can eat at each meal.  Eat 4-6 ounces of lean protein each day, such as lean meat, chicken, fish, eggs, or tofu. 1 ounce is equal to 1 ounce of meat, chicken, or fish, 1 egg, or 1/4 cup of tofu.  Eat some foods each day that contain healthy fats, such as avocado, nuts, seeds, and fish. Lifestyle   Check your blood glucose regularly.  Exercise at least 30 minutes 5 or more days each week, or as told by your health care provider.  Take medicines as told by your health care provider.  Do not use any products that contain nicotine or tobacco, such as cigarettes and e-cigarettes. If you need help quitting, ask your health care provider.  Work with a counselor or diabetes educator to identify strategies to manage stress and any emotional and social challenges. What are some questions to ask my health care provider?  Do I need to meet with a diabetes educator?  Do I need to meet with a dietitian?  What number can I call if I have questions?  When are the best times to check my blood glucose? Where to find more information:  American Diabetes Association: diabetes.org/food-and-fitness/food  Academy of Nutrition and Dietetics:  www.eatright.org/resources/health/diseases-and-conditions/diabetes  National Institute of Diabetes and Digestive and Kidney Diseases (NIH): www.niddk.nih.gov/health-information/diabetes/overview/diet-eating-physical-activity Summary  A healthy meal plan will help you control your blood glucose and maintain a healthy lifestyle.  Working with a diet and nutrition specialist (dietitian) can help you make a meal plan that is best for you.  Keep in mind that carbohydrates and alcohol have immediate effects on your blood glucose levels. It is important to count carbohydrates and to use alcohol carefully. This information is not intended to replace advice given to you by your health care provider. Make sure you discuss any questions you have with your health care provider. Document Released: 01/11/2005 Document Revised: 05/21/2016 Document Reviewed: 05/21/2016 Elsevier Interactive Patient Education  2018 [employer]  

## 2018-04-04 ENCOUNTER — Encounter: Payer: Self-pay | Admitting: Family Medicine

## 2018-04-08 ENCOUNTER — Other Ambulatory Visit: Payer: Self-pay

## 2018-04-08 ENCOUNTER — Telehealth: Payer: Self-pay | Admitting: Family Medicine

## 2018-04-08 DIAGNOSIS — E119 Type 2 diabetes mellitus without complications: Secondary | ICD-10-CM

## 2018-04-08 MED ORDER — ONETOUCH DELICA LANCETS FINE MISC: 1.0000 | Freq: Every day | 5 refills | Status: DC

## 2018-04-08 MED ORDER — INSULIN PEN NEEDLE 32G X 4 MM MISC: 3 refills | Status: DC

## 2018-04-08 NOTE — Telephone Encounter (Signed)
1) Medication(s) Requested (by name): lancets 2) Pharmacy of Choice:  [facility] on [address]

## 2018-04-09 ENCOUNTER — Other Ambulatory Visit: Payer: Self-pay | Admitting: Family Medicine

## 2018-04-09 DIAGNOSIS — E119 Type 2 diabetes mellitus without complications: Secondary | ICD-10-CM

## 2018-04-28 ENCOUNTER — Telehealth: Payer: Self-pay | Admitting: Family Medicine

## 2018-04-28 ENCOUNTER — Other Ambulatory Visit: Payer: Self-pay | Admitting: Family Medicine

## 2018-04-28 DIAGNOSIS — E119 Type 2 diabetes mellitus without complications: Secondary | ICD-10-CM

## 2018-04-28 MED ORDER — ONETOUCH VERIO W/DEVICE KIT: PACK | 0 refills | Status: DC

## 2018-04-28 MED ORDER — ONETOUCH DELICA LANCETS 33G MISC: 2 refills | Status: DC

## 2018-04-28 MED ORDER — GLUCOSE BLOOD VI STRP: ORAL_STRIP | 2 refills | Status: DC

## 2018-04-28 NOTE — Telephone Encounter (Signed)
1) Medication(s) Requested (by name): Meter and test strips  2) Pharmacy of Choice: [facility]  3) Special Requests:   Approved medications will be sent to the pharmacy, we will reach out if there is an issue.  Requests made after 3pm may not be addressed until the following business day!  If a patient is unsure of the name of the medication(s) please note and ask patient to call back when they are able to provide all info, do not send to responsible party until all information is available!

## 2018-06-27 ENCOUNTER — Ambulatory Visit: Payer: Self-pay | Attending: Family Medicine

## 2018-06-30 ENCOUNTER — Telehealth: Payer: Self-pay | Admitting: Family Medicine

## 2018-06-30 MED FILL — glipiZIDE XL 10 MG TB24: 10 | 30 days supply | Qty: 60 | Fill #0

## 2018-06-30 MED FILL — LISINOPRIL-HCTZ 20-25 MG TA: 20-25 | 30 days supply | Qty: 30 | Fill #0

## 2018-06-30 MED FILL — JARDIANCE 25 MG TABLET: 25 | 14 days supply | Qty: 14 | Fill #0

## 2018-06-30 NOTE — Telephone Encounter (Signed)
LVM to inform that we are missing the bank statement that show on the taxes form we need 3 months of the statements to complete the application

## 2018-07-02 MED FILL — !LANTUS SOLOSTAR 100UNITS/M: 100 | 15 days supply | Qty: 3 | Fill #0

## 2018-07-07 ENCOUNTER — Telehealth: Payer: Self-pay | Admitting: Family Medicine

## 2018-07-07 NOTE — Telephone Encounter (Signed)
that is what she told me, but I just left a VM inform her that we need the pay-stub since it show that she still active in the bank account and give her the 14 day or her application will be denied. according the rules by [facility]

## 2018-07-10 ENCOUNTER — Other Ambulatory Visit: Payer: Self-pay | Admitting: Family Medicine

## 2018-07-10 ENCOUNTER — Ambulatory Visit: Payer: Self-pay | Attending: Family Medicine | Admitting: Family Medicine

## 2018-07-10 ENCOUNTER — Other Ambulatory Visit: Payer: Self-pay

## 2018-07-10 ENCOUNTER — Encounter: Payer: Self-pay | Admitting: Family Medicine

## 2018-07-10 ENCOUNTER — Telehealth: Payer: Self-pay | Admitting: Family Medicine

## 2018-07-10 VITALS — BP 99/61 | HR 63 | Temp 98.5°F | Ht 63.0 in | Wt 130.6 lb

## 2018-07-10 DIAGNOSIS — F17210 Nicotine dependence, cigarettes, uncomplicated: Secondary | ICD-10-CM

## 2018-07-10 DIAGNOSIS — F17200 Nicotine dependence, unspecified, uncomplicated: Secondary | ICD-10-CM

## 2018-07-10 DIAGNOSIS — E1100 Type 2 diabetes mellitus with hyperosmolarity without nonketotic hyperglycemic-hyperosmolar coma (NKHHC): Secondary | ICD-10-CM

## 2018-07-10 DIAGNOSIS — I10 Essential (primary) hypertension: Secondary | ICD-10-CM

## 2018-07-10 DIAGNOSIS — Z794 Long term (current) use of insulin: Secondary | ICD-10-CM

## 2018-07-10 LAB — POCT GLYCOSYLATED HEMOGLOBIN (HGB A1C): HbA1c, POC (controlled diabetic range): 7.8 % — AB (ref 0.0–7.0)

## 2018-07-10 LAB — GLUCOSE, POCT (MANUAL RESULT ENTRY): POC GLUCOSE: 145 mg/dL — AB (ref 70–99)

## 2018-07-10 MED ORDER — TRUE METRIX METER DEVI: 1.0000 | Freq: Three times a day (TID) | 0 refills | Status: DC

## 2018-07-10 MED ORDER — EMPAGLIFLOZIN 25 MG PO TABS: 25.0000 mg | ORAL_TABLET | Freq: Every day | ORAL | 6 refills | Status: DC

## 2018-07-10 MED ORDER — LISINOPRIL-HYDROCHLOROTHIAZIDE 10-12.5 MG PO TABS: 1.0000 | ORAL_TABLET | Freq: Every day | ORAL | 3 refills | Status: DC

## 2018-07-10 MED ORDER — TRUEPLUS LANCETS 28G MISC: 1.0000 | Freq: Three times a day (TID) | 12 refills | Status: DC

## 2018-07-10 MED ORDER — GLUCOSE BLOOD VI STRP: ORAL_STRIP | 12 refills | Status: DC

## 2018-07-10 MED ORDER — GLIPIZIDE ER 10 MG PO TB24: 20.0000 mg | ORAL_TABLET | Freq: Every day | ORAL | 6 refills | Status: DC

## 2018-07-10 MED ORDER — BUPROPION HCL ER (SR) 150 MG PO TB12: 150.0000 mg | ORAL_TABLET | Freq: Two times a day (BID) | ORAL | 3 refills | Status: DC

## 2018-07-10 NOTE — Progress Notes (Signed)
Subjective:  Patient ID: Haley Mendez, female    DOB: 01/30/1956  Age: 63 y.o. MRN: [id number]  CC: Diabetes   HPI Haley Mendez s a 63 year old female with a history of type 2 diabetes mellitus (A1c 7.8), hypertension, hyperlipidemia who presents today for a follow-up visit. Her A1c is 7.8 which is down from 8.4 previously.  She was commenced on Lantus at her last visit but never started it.  Her metformin was also discontinued at her last visit. She denies visual symptoms, numbness in extremities, hypoglycemia. She would also like a prescription for glucometer. Her blood pressure is in the hypotensive range and she endorses intermittent dizziness but no syncope. Denies chest pains, dyspnea, wheezing.  Past Medical History:  Diagnosis Date   Arthritis    Bone spur of other site    left rotator cuff   Diabetes mellitus without complication ([place])    History of colon polyps    Hyperlipidemia    Hypertension     History reviewed. No pertinent surgical history.  Family History  Problem Relation Age of Onset   Early death Mother    Diabetes Father    Hypertension Father    Diabetes Brother    Diabetes Brother    Amblyopia Neg Hx    Blindness Neg Hx    Cataracts Neg Hx    Glaucoma Neg Hx    Macular degeneration Neg Hx    Retinal detachment Neg Hx    Strabismus Neg Hx    Retinitis pigmentosa Neg Hx     Allergies  Allergen Reactions   Acidophilus     Outpatient Medications Prior to Visit  Medication Sig Dispense Refill   Blood Glucose Monitoring Suppl (ONETOUCH VERIO) w/Device KIT USE AS DIRECTED to test blood sugar three times daily 1 kit 0   glucose blood (ONETOUCH VERIO) test strip Use as directed to test blood sugar three times daily 300 each 2   Insulin Pen Needle (TRUEPLUS PEN NEEDLES) 32G X 4 MM MISC Use as directed to administer insulin daily 100 each 3   omeprazole (PRILOSEC) 40 MG capsule TAKE 1 CAPSULE BY MOUTH EVERY DAY 30 capsule  0   ONETOUCH DELICA LANCETS 18E MISC Use as directed to test blood sugar three times daily 300 each 2   ONETOUCH DELICA LANCETS FINE MISC 1 each by Does not apply route daily. Dx E11.9 100 each 5   simvastatin (ZOCOR) 40 MG tablet Take 1 tablet (40 mg total) by mouth at bedtime. 30 tablet 6   buPROPion (WELLBUTRIN SR) 150 MG 12 hr tablet Take 1 tablet (150 mg total) by mouth 2 (two) times daily. 60 tablet 3   empagliflozin (JARDIANCE) 25 MG TABS tablet Take 25 mg by mouth daily. 30 tablet 6   glipiZIDE (GLUCOTROL XL) 10 MG 24 hr tablet Take 2 tablets (20 mg total) by mouth daily with breakfast. 60 tablet 6   lisinopril-hydrochlorothiazide (PRINZIDE,ZESTORETIC) 20-25 MG tablet Take 1 tablet by mouth daily. 30 tablet 6   Insulin Glargine (LANTUS SOLOSTAR) 100 UNIT/ML Solostar Pen Inject 15 Units into the skin at bedtime. (Patient not taking: Reported on 07/10/2018) 5 pen 6   No facility-administered medications prior to visit.      ROS Review of Systems General: negative for fever, weight loss, appetite change Eyes: no visual symptoms. ENT: no ear symptoms, no sinus tenderness, no nasal congestion or sore throat. Neck: no pain  Respiratory: no wheezing, shortness of breath, cough Cardiovascular: no chest pain, no dyspnea  on exertion, no pedal edema, no orthopnea. Gastrointestinal: no abdominal pain, no diarrhea, no constipation Genito-Urinary: no urinary frequency, no dysuria, no polyuria. Hematologic: no bruising Endocrine: no cold or heat intolerance Neurological: no headaches, no seizures, no tremors Musculoskeletal: no joint pains, no joint swelling Skin: no pruritus, no rash. Psychological: no depression, no anxiety,    Objective:  BP 99/61    Pulse 63    Temp 98.5 F (36.9 C) (Oral)    Ht 5' 3"  (1.6 m)    Wt 130 lb 9.6 oz (59.2 kg)    SpO2 98%    BMI 23.13 kg/m   BP/Weight 07/10/2018 04/03/2018 7/82/9562  Systolic BP 99 96 130  Diastolic BP 61 54 73  Wt. (Lbs) 130.6  132 139  BMI 23.13 23.38 24.62      Physical Exam Constitutional: normal appearing,  Eyes: PERRLA HEENT: Head is atraumatic, normal sinuses, normal oropharynx, normal appearing tonsils and palate, tympanic membrane is normal bilaterally. Neck: normal range of motion, no thyromegaly, no JVD Cardiovascular: normal rate and rhythm, normal heart sounds, no murmurs, rub or gallop, no pedal edema Respiratory: Normal breath sounds, clear to auscultation bilaterally, no wheezes, no rales, no rhonchi Abdomen: soft, not tender to palpation, normal bowel sounds, no enlarged organs Musculoskeletal: Full ROM, no tenderness in joints Skin: warm and dry, no lesions. Neurological: alert, oriented x3, cranial nerves I-XII grossly intact , normal motor strength, normal sensation. Psychological: normal mood.   CMP Latest Ref Rng & Units 11/19/2017 05/29/2017 03/16/2016  Glucose 65 - 99 mg/dL 67 118(H) 115(H)  BUN 8 - 27 mg/dL 20 17 16   Creatinine 0.57 - 1.00 mg/dL 0.90 0.79 0.80  Sodium 134 - 144 mmol/L 141 142 135  Potassium 3.5 - 5.2 mmol/L 4.7 4.8 4.1  Chloride 96 - 106 mmol/L 104 104 104  CO2 20 - 29 mmol/L 21 20 21   Calcium 8.7 - 10.3 mg/dL 9.7 9.7 9.9  Total Protein 6.0 - 8.5 g/dL 6.7 7.0 7.1  Total Bilirubin 0.0 - 1.2 mg/dL <0.2 <0.2 0.2  Alkaline Phos 39 - 117 IU/L 83 92 86  AST 0 - 40 IU/L 14 18 16   ALT 0 - 32 IU/L 16 15 14     Lipid Panel     Component Value Date/Time   CHOL 158 11/19/2017 0854   TRIG 105 11/19/2017 0854   HDL 33 (L) 11/19/2017 0854   CHOLHDL 4.8 (H) 11/19/2017 0854   CHOLHDL 2.8 12/02/2015 0945   VLDL 18 12/02/2015 0945   LDLCALC 104 (H) 11/19/2017 0854    CBC    Component Value Date/Time   WBC 9.4 07/13/2013 1530   RBC 4.58 07/13/2013 1530   HGB 12.2 07/13/2013 1530   HCT 36.7 07/13/2013 1530   PLT 290 07/13/2013 1530   MCV 80.1 07/13/2013 1530   MCH 26.6 07/13/2013 1530   MCHC 33.2 07/13/2013 1530   RDW 17.0 (H) 07/13/2013 1530   LYMPHSABS 3.9  07/13/2013 1530   MONOABS 0.8 07/13/2013 1530   EOSABS 0.3 07/13/2013 1530   BASOSABS 0.1 07/13/2013 1530    Lab Results  Component Value Date   HGBA1C 7.8 (A) 07/10/2018    Assessment & Plan:   1. Type 2 diabetes mellitus with hyperosmolarity without coma, with long-term current use of insulin (HCC) A1c of 7.8 which has slightly improved from 8.4 previously She never commenced Lantus Placed on clinical pharmacist schedule for teaching on Lantus administration Counseled on Diabetic diet, my plate method, 865 minutes of  moderate intensity exercise/week Keep blood sugar logs with fasting goals of 80-120 mg/dl, random of less than 180 and in the event of sugars less than 60 mg/dl or greater than 400 mg/dl please notify the clinic ASAP. It is recommended that you undergo annual eye exams and annual foot exams. Pneumonia vaccine is recommended. - POCT glucose (manual entry) - POCT glycosylated hemoglobin (Hb A1C) - CMP14+EGFR - Lipid panel - Microalbumin / creatinine urine ratio - empagliflozin (JARDIANCE) 25 MG TABS tablet; Take 25 mg by mouth daily.  Dispense: 30 tablet; Refill: 6 - glipiZIDE (GLUCOTROL XL) 10 MG 24 hr tablet; Take 2 tablets (20 mg total) by mouth daily with breakfast.  Dispense: 60 tablet; Refill: 6 - glucose blood (TRUE METRIX BLOOD GLUCOSE TEST) test strip; Use 3 times daily before meals  Dispense: 100 each; Refill: 12 - Blood Glucose Monitoring Suppl (TRUE METRIX METER) DEVI; 1 each by Does not apply route 3 (three) times daily before meals.  Dispense: 1 Device; Refill: 0 - TRUEplus Lancets 28G MISC; 1 each by Does not apply route 3 (three) times daily before meals.  Dispense: 100 each; Refill: 12  2. TOBACCO ABUSE Spent 3 minutes counseling on smoking cessation and she is not ready to quit at this time - buPROPion (WELLBUTRIN SR) 150 MG 12 hr tablet; Take 1 tablet (150 mg total) by mouth 2 (two) times daily.  Dispense: 60 tablet; Refill: 3  3. Essential  hypertension Controlled Blood pressure is on the low side and I have decreased her dose of lisinopril/HCTZ Counseled on blood pressure goal of less than 130/80, low-sodium, DASH diet, medication compliance, 150 minutes of moderate intensity exercise per week. Discussed medication compliance, adverse effects. - lisinopril-hydrochlorothiazide (PRINZIDE,ZESTORETIC) 10-12.5 MG tablet; Take 1 tablet by mouth daily.  Dispense: 30 tablet; Refill: 3   Meds ordered this encounter  Medications   buPROPion (WELLBUTRIN SR) 150 MG 12 hr tablet    Sig: Take 1 tablet (150 mg total) by mouth 2 (two) times daily.    Dispense:  60 tablet    Refill:  3   empagliflozin (JARDIANCE) 25 MG TABS tablet    Sig: Take 25 mg by mouth daily.    Dispense:  30 tablet    Refill:  6    Discontinue Invokana   glipiZIDE (GLUCOTROL XL) 10 MG 24 hr tablet    Sig: Take 2 tablets (20 mg total) by mouth daily with breakfast.    Dispense:  60 tablet    Refill:  6   lisinopril-hydrochlorothiazide (PRINZIDE,ZESTORETIC) 10-12.5 MG tablet    Sig: Take 1 tablet by mouth daily.    Dispense:  30 tablet    Refill:  3    Discontinue previous dose   glucose blood (TRUE METRIX BLOOD GLUCOSE TEST) test strip    Sig: Use 3 times daily before meals    Dispense:  100 each    Refill:  12   Blood Glucose Monitoring Suppl (TRUE METRIX METER) DEVI    Sig: 1 each by Does not apply route 3 (three) times daily before meals.    Dispense:  1 Device    Refill:  0   TRUEplus Lancets 28G MISC    Sig: 1 each by Does not apply route 3 (three) times daily before meals.    Dispense:  100 each    Refill:  12    Follow-up: Return in about 3 months (around 10/10/2018) for 1 week follow up with luke; 3 month follow up with  PCP.       Charlott Rakes, MD, FAAFP. [facility] and [place], [place]   07/10/2018, 3:25 PM

## 2018-07-10 NOTE — Telephone Encounter (Signed)
Pt is very rude, she said that we are only given help to our kind of people, not to her, I told her that we need that to help anyone, that we need the statement or we can denied the application, she still insisted that we only help our people and she hand up, I don't know if she is going to bring the statement or not at this point.?

## 2018-07-10 NOTE — Telephone Encounter (Signed)
I called Pt to request the bank statement for the Month of February, she claim she still did not received and she provide [place] the 3 last 3 bank statement that she had

## 2018-07-11 LAB — LIPID PANEL W/O CHOL/HDL RATIO
Cholesterol, Total: 178 mg/dL (ref 100–199)
HDL: 42 mg/dL (ref >39)
LDL Calculated: 120 mg/dL — ABNORMAL HIGH (ref 0–99)
Triglycerides: 82 mg/dL (ref 0–149)
VLDL Cholesterol Cal: 16 mg/dL (ref 5–40)

## 2018-07-11 LAB — COMPREHENSIVE METABOLIC PANEL
A/G RATIO: 1.8 (ref 1.2–2.2)
ALK PHOS: 115 IU/L (ref 39–117)
ALT: 16 IU/L (ref 0–32)
AST: 19 IU/L (ref 0–40)
Albumin: 4.7 g/dL (ref 3.8–4.8)
BUN/Creatinine Ratio: 27 (ref 12–28)
BUN: 35 mg/dL — ABNORMAL HIGH (ref 8–27)
Bilirubin Total: 0.3 mg/dL (ref 0.0–1.2)
CO2: 19 mmol/L — ABNORMAL LOW (ref 20–29)
Calcium: 9.8 mg/dL (ref 8.7–10.3)
Chloride: 105 mmol/L (ref 96–106)
Creatinine, Ser: 1.3 mg/dL — ABNORMAL HIGH (ref 0.57–1.00)
GFR calc Af Amer: 50 mL/min/{1.73_m2} — ABNORMAL LOW (ref >59)
GFR calc non Af Amer: 44 mL/min/{1.73_m2} — ABNORMAL LOW (ref >59)
Globulin, Total: 2.6 g/dL (ref 1.5–4.5)
Glucose: 129 mg/dL — ABNORMAL HIGH (ref 65–99)
Potassium: 5.1 mmol/L (ref 3.5–5.2)
Sodium: 142 mmol/L (ref 134–144)
Total Protein: 7.3 g/dL (ref 6.0–8.5)

## 2018-07-11 LAB — MICROALBUMIN / CREATININE URINE RATIO
Creatinine, Urine: 91.9 mg/dL
Microalb/Creat Ratio: 5 mg/g creat (ref 0–29)
Microalbumin, Urine: 4.3 ug/mL

## 2018-07-14 MED FILL — SIMVASTATIN 40 MG TABLET: 40 | 30 days supply | Qty: 30 | Fill #0

## 2018-07-14 MED FILL — !LANTUS SOLOSTAR 100UNITS/M: 100 | 20 days supply | Qty: 3 | Fill #1

## 2018-07-14 MED FILL — JARDIANCE 25 MG TABLET: 25 | 14 days supply | Qty: 14 | Fill #1

## 2018-07-17 ENCOUNTER — Telehealth: Payer: Self-pay | Admitting: Family Medicine

## 2018-07-17 DIAGNOSIS — E1100 Type 2 diabetes mellitus with hyperosmolarity without nonketotic hyperglycemic-hyperosmolar coma (NKHHC): Secondary | ICD-10-CM

## 2018-07-17 DIAGNOSIS — Z794 Long term (current) use of insulin: Principal | ICD-10-CM

## 2018-07-17 MED ORDER — TRUEPLUS LANCETS 28G MISC: 1.0000 | Freq: Three times a day (TID) | 12 refills | Status: DC

## 2018-07-17 MED ORDER — GLUCOSE BLOOD VI STRP: ORAL_STRIP | 12 refills | Status: DC

## 2018-07-17 MED ORDER — TRUE METRIX METER DEVI: 1.0000 | Freq: Three times a day (TID) | 0 refills | Status: DC

## 2018-07-17 MED FILL — TRUE METRIX TEST STRIP: 100 days supply | Qty: 100 | Fill #0

## 2018-07-17 MED FILL — TRUEplus LANCETS 28G MISC: 33 days supply | Qty: 100 | Fill #0

## 2018-07-17 MED FILL — !TRUE METRIX BLOOD GLUCOSE: 1 days supply | Qty: 1 | Fill #0

## 2018-07-17 NOTE — Telephone Encounter (Signed)
Pt called in for results would like a call back with update or for her results to be sent over by mail

## 2018-07-17 NOTE — Telephone Encounter (Signed)
-----   Message from Charlott Rakes, MD sent at 07/16/2018  2:21 PM EDT ----- Liver and kidney function are normal however cholesterol has trended up slightly compared to previous labs.  Please advise to comply with a low-cholesterol diet and exercise.

## 2018-07-17 NOTE — Telephone Encounter (Signed)
Patient was called and informed of lab results. Patient had no questions.  

## 2018-07-23 ENCOUNTER — Ambulatory Visit: Payer: Self-pay | Attending: Family Medicine | Admitting: Pharmacist

## 2018-07-23 ENCOUNTER — Other Ambulatory Visit: Payer: Self-pay

## 2018-07-23 DIAGNOSIS — Z79899 Other long term (current) drug therapy: Secondary | ICD-10-CM

## 2018-07-23 NOTE — Progress Notes (Signed)
Patient was educated on the use of the Lantus pen. Reviewed necessary supplies and operation of the pen. Also reviewed goal blood glucose levels. Patient was able to demonstrate use. All questions and concerns were addressed.  

## 2018-07-24 ENCOUNTER — Other Ambulatory Visit: Payer: Self-pay | Admitting: Family Medicine

## 2018-07-24 DIAGNOSIS — F17200 Nicotine dependence, unspecified, uncomplicated: Secondary | ICD-10-CM

## 2018-07-29 MED FILL — LISINOPRIL-HCTZ 20-25 MG TA: 20-25 | 30 days supply | Qty: 30 | Fill #1

## 2018-07-29 MED FILL — glipiZIDE XL 10 MG TB24: 10 | 30 days supply | Qty: 60 | Fill #1

## 2018-07-29 MED FILL — !LANTUS SOLOSTAR 100UNITS/M: 100 | 40 days supply | Qty: 6 | Fill #2

## 2018-08-04 ENCOUNTER — Other Ambulatory Visit: Payer: Self-pay | Admitting: Family Medicine

## 2018-08-04 DIAGNOSIS — E1100 Type 2 diabetes mellitus with hyperosmolarity without nonketotic hyperglycemic-hyperosmolar coma (NKHHC): Secondary | ICD-10-CM

## 2018-08-04 DIAGNOSIS — Z794 Long term (current) use of insulin: Principal | ICD-10-CM

## 2018-08-04 MED FILL — JARDIANCE 25 MG TABLET: 25 | 30 days supply | Qty: 30 | Fill #0

## 2018-08-28 MED FILL — glipiZIDE XL 10 MG TB24: 10 | 30 days supply | Qty: 60 | Fill #2

## 2018-08-28 MED FILL — !LANTUS SOLOSTAR 100UNITS/M: 100 | 40 days supply | Qty: 6 | Fill #3

## 2018-08-28 MED FILL — JARDIANCE 25 MG TABLET: 25 | 30 days supply | Qty: 30 | Fill #1

## 2018-08-28 MED FILL — LISINOPRIL-HCTZ 20-25 MG TA: 20-25 | 30 days supply | Qty: 30 | Fill #2

## 2018-08-28 MED FILL — ?SIMVASTATIN 40MG TABS: 40 | 30 days supply | Qty: 30 | Fill #1

## 2018-09-30 MED FILL — LISINOPRIL-HCTZ 20-25 MG TA: 20-25 | 30 days supply | Qty: 30 | Fill #3

## 2018-09-30 MED FILL — ?SIMVASTATIN 40MG TABS: 40 | 30 days supply | Qty: 30 | Fill #2

## 2018-09-30 MED FILL — glipiZIDE XL 10 MG TB24: 10 | 30 days supply | Qty: 60 | Fill #3

## 2018-10-22 ENCOUNTER — Ambulatory Visit: Payer: Self-pay | Attending: Family Medicine | Admitting: Family Medicine

## 2018-10-22 ENCOUNTER — Other Ambulatory Visit: Payer: Self-pay

## 2018-10-22 ENCOUNTER — Encounter: Payer: Self-pay | Admitting: Family Medicine

## 2018-10-22 VITALS — BP 108/68 | HR 69 | Temp 98.0°F | Ht 63.0 in | Wt 130.2 lb

## 2018-10-22 DIAGNOSIS — F17200 Nicotine dependence, unspecified, uncomplicated: Secondary | ICD-10-CM

## 2018-10-22 DIAGNOSIS — E7800 Pure hypercholesterolemia, unspecified: Secondary | ICD-10-CM

## 2018-10-22 DIAGNOSIS — E1100 Type 2 diabetes mellitus with hyperosmolarity without nonketotic hyperglycemic-hyperosmolar coma (NKHHC): Secondary | ICD-10-CM

## 2018-10-22 DIAGNOSIS — Z794 Long term (current) use of insulin: Secondary | ICD-10-CM

## 2018-10-22 DIAGNOSIS — I10 Essential (primary) hypertension: Secondary | ICD-10-CM

## 2018-10-22 LAB — GLUCOSE, POCT (MANUAL RESULT ENTRY): POC Glucose: 160 mg/dl — AB (ref 70–99)

## 2018-10-22 LAB — POCT GLYCOSYLATED HEMOGLOBIN (HGB A1C): HbA1c, POC (controlled diabetic range): 8.3 % — AB (ref 0.0–7.0)

## 2018-10-22 MED ORDER — JARDIANCE 25 MG PO TABS: 25.0000 mg | ORAL_TABLET | Freq: Every day | ORAL | 2 refills | Status: DC

## 2018-10-22 MED ORDER — LISINOPRIL-HYDROCHLOROTHIAZIDE 10-12.5 MG PO TABS: 1.0000 | ORAL_TABLET | Freq: Every day | ORAL | 3 refills | Status: DC

## 2018-10-22 MED ORDER — LANTUS SOLOSTAR 100 UNIT/ML ~~LOC~~ SOPN: 20.0000 [IU] | PEN_INJECTOR | Freq: Every day | SUBCUTANEOUS | 6 refills | Status: DC

## 2018-10-22 MED ORDER — GLIPIZIDE ER 10 MG PO TB24: 20.0000 mg | ORAL_TABLET | Freq: Every day | ORAL | 6 refills | Status: DC

## 2018-10-22 MED ORDER — SIMVASTATIN 40 MG PO TABS: 40.0000 mg | ORAL_TABLET | Freq: Every day | ORAL | 6 refills | Status: DC

## 2018-10-22 MED FILL — LISINOPRIL-HCTZ 10-12.5 MG: 10-12.5 | 30 days supply | Qty: 30 | Fill #0

## 2018-10-22 MED FILL — **JARDIANCE 25 MG TABLET: 25 | 30 days supply | Qty: 30 | Fill #0

## 2018-10-22 MED FILL — $LANTUS SOLOSTAR 100 UNITS/: 100 | 30 days supply | Qty: 6 | Fill #0

## 2018-10-22 NOTE — Progress Notes (Signed)
Subjective:  Patient ID: Haley Mendez, female    DOB: 01/06/1956  Age: 63 y.o. MRN: [id number]  CC: Diabetes   HPI Haley Mendez is a 63 year old female with a history of type 2 diabetes mellitus (A1c 8.3), hypertension, hyperlipidemia who presents today for a follow-up visit. Her A1c is 8.3 which is up from 7.8 previously and she endorses compliance with Lantus 15 units, Jardiance, glipizide but she has not been adherent to her diabetic diet and exercise.  Denies hypoglycemia, numbness in extremities. She has been compliant with her antihypertensive but endorses some lightheadedness and her blood pressure is on the hypotensive side.  At her last visit I had decreased her lisinopril/HCTZ from 20/25 mg to 10/12.5 however she still obtained 20/25 mg from the pharmacy. Her last lipid panel revealed elevated LDL of 120 mg and she is on simvastatin 40 mg; denies myalgias with her medication.  She smokes half a pack of cigarettes per day which she states is down from 1 pack/day previously She has no additional concerns today.  Past Medical History:  Diagnosis Date  . Arthritis   . Bone spur of other site    left rotator cuff  . Diabetes mellitus without complication ([place])   . History of colon polyps   . Hyperlipidemia   . Hypertension     History reviewed. No pertinent surgical history.  Family History  Problem Relation Age of Onset  . Early death Mother   . Diabetes Father   . Hypertension Father   . Diabetes Brother   . Diabetes Brother   . Amblyopia Neg Hx   . Blindness Neg Hx   . Cataracts Neg Hx   . Glaucoma Neg Hx   . Macular degeneration Neg Hx   . Retinal detachment Neg Hx   . Strabismus Neg Hx   . Retinitis pigmentosa Neg Hx     Allergies  Allergen Reactions  . Acidophilus     Outpatient Medications Prior to Visit  Medication Sig Dispense Refill  . glucose blood (TRUE METRIX BLOOD GLUCOSE TEST) test strip Use 3 times daily before meals 100 each 12  . Insulin  Pen Needle (TRUEPLUS PEN NEEDLES) 32G X 4 MM MISC Use as directed to administer insulin daily 100 each 3  . TRUEplus Lancets 28G MISC 1 each by Does not apply route 3 (three) times daily before meals. 100 each 12  . glipiZIDE (GLUCOTROL XL) 10 MG 24 hr tablet Take 2 tablets (20 mg total) by mouth daily with breakfast. 60 tablet 6  . Insulin Glargine (LANTUS SOLOSTAR) 100 UNIT/ML Solostar Pen Inject 15 Units into the skin at bedtime. 5 pen 6  . JARDIANCE 25 MG TABS tablet TAKE 1 TABLET BY MOUTH DAILY. 30 tablet 2  . lisinopril-hydrochlorothiazide (PRINZIDE,ZESTORETIC) 10-12.5 MG tablet Take 1 tablet by mouth daily. 30 tablet 3  . simvastatin (ZOCOR) 40 MG tablet Take 1 tablet (40 mg total) by mouth at bedtime. 30 tablet 6  . Blood Glucose Monitoring Suppl (TRUE METRIX METER) DEVI 1 each by Does not apply route 3 (three) times daily before meals. 1 Device 0  . buPROPion (WELLBUTRIN SR) 150 MG 12 hr tablet TAKE 1 TABLET BY MOUTH TWICE A DAY (Patient not taking: Reported on 10/22/2018) 180 tablet 0  . omeprazole (PRILOSEC) 40 MG capsule TAKE 1 CAPSULE BY MOUTH EVERY DAY (Patient not taking: Reported on 10/22/2018) 30 capsule 0   No facility-administered medications prior to visit.      ROS Review of  Systems  Constitutional: Negative for activity change, appetite change and fatigue.  HENT: Negative for congestion, sinus pressure and sore throat.   Eyes: Negative for visual disturbance.  Respiratory: Negative for cough, chest tightness, shortness of breath and wheezing.   Cardiovascular: Negative for chest pain and palpitations.  Gastrointestinal: Negative for abdominal distention, abdominal pain and constipation.  Endocrine: Negative for polydipsia.  Genitourinary: Negative for dysuria and frequency.  Musculoskeletal: Negative for arthralgias and back pain.  Skin: Negative for rash.  Neurological: Negative for tremors, light-headedness and numbness.  Hematological: Does not bruise/bleed easily.   Psychiatric/Behavioral: Negative for agitation and behavioral problems.    Objective:  BP 108/68   Pulse 69   Temp 98 F (36.7 C) (Oral)   Ht 5\' 3"  (1.6 m)   Wt 130 lb 3.2 oz (59.1 kg)   SpO2 98%   BMI 23.06 kg/m   BP/Weight 10/22/2018 07/10/2018 70/09/2374  Systolic BP 283 99 96  Diastolic BP 68 61 54  Wt. (Lbs) 130.2 130.6 132  BMI 23.06 23.13 23.38      Physical Exam Constitutional:      Appearance: She is well-developed.  Cardiovascular:     Rate and Rhythm: Normal rate.     Heart sounds: Normal heart sounds. No murmur.  Pulmonary:     Effort: Pulmonary effort is normal.     Breath sounds: Normal breath sounds. No wheezing or rales.  Chest:     Chest wall: No tenderness.  Abdominal:     General: Bowel sounds are normal. There is no distension.     Palpations: Abdomen is soft. There is no mass.     Tenderness: There is no abdominal tenderness.  Musculoskeletal: Normal range of motion.  Neurological:     Mental Status: She is alert and oriented to person, place, and time.     CMP Latest Ref Rng & Units 07/10/2018 11/19/2017 05/29/2017  Glucose 65 - 99 mg/dL 129(H) 67 118(H)  BUN 8 - 27 mg/dL 35(H) 20 17  Creatinine 0.57 - 1.00 mg/dL 1.30(H) 0.90 0.79  Sodium 134 - 144 mmol/L 142 141 142  Potassium 3.5 - 5.2 mmol/L 5.1 4.7 4.8  Chloride 96 - 106 mmol/L 105 104 104  CO2 20 - 29 mmol/L 19(L) 21 20  Calcium 8.7 - 10.3 mg/dL 9.8 9.7 9.7  Total Protein 6.0 - 8.5 g/dL 7.3 6.7 7.0  Total Bilirubin 0.0 - 1.2 mg/dL 0.3 <0.2 <0.2  Alkaline Phos 39 - 117 IU/L 115 83 92  AST 0 - 40 IU/L 19 14 18   ALT 0 - 32 IU/L 16 16 15     Lipid Panel     Component Value Date/Time   CHOL 178 07/10/2018 0954   TRIG 82 07/10/2018 0954   HDL 42 07/10/2018 0954   CHOLHDL 4.8 (H) 11/19/2017 0854   CHOLHDL 2.8 12/02/2015 0945   VLDL 18 12/02/2015 0945   LDLCALC 120 (H) 07/10/2018 0954    CBC    Component Value Date/Time   WBC 9.4 07/13/2013 1530   RBC 4.58 07/13/2013 1530   HGB  12.2 07/13/2013 1530   HCT 36.7 07/13/2013 1530   PLT 290 07/13/2013 1530   MCV 80.1 07/13/2013 1530   MCH 26.6 07/13/2013 1530   MCHC 33.2 07/13/2013 1530   RDW 17.0 (H) 07/13/2013 1530   LYMPHSABS 3.9 07/13/2013 1530   MONOABS 0.8 07/13/2013 1530   EOSABS 0.3 07/13/2013 1530   BASOSABS 0.1 07/13/2013 1530    Lab Results  Component  Value Date   HGBA1C 8.3 (A) 10/22/2018    Assessment & Plan:   1. Type 2 diabetes mellitus with hyperosmolarity without coma, with long-term current use of insulin (HCC) Uncontrolled with A1c of 8.3 which has trended up from 7.8 previously Increased Lantus dose Counseled on Diabetic diet, my plate method, 659 minutes of moderate intensity exercise/week Keep blood sugar logs with fasting goals of 80-120 mg/dl, random of less than 180 and in the event of sugars less than 60 mg/dl or greater than 400 mg/dl please notify the clinic ASAP. It is recommended that you undergo annual eye exams and annual foot exams. Pneumonia vaccine is recommended. - POCT glucose (manual entry) - POCT glycosylated hemoglobin (Hb A1C) - empagliflozin (JARDIANCE) 25 MG TABS tablet; Take 25 mg by mouth daily.  Dispense: 30 tablet; Refill: 2 - Insulin Glargine (LANTUS SOLOSTAR) 100 UNIT/ML Solostar Pen; Inject 20 Units into the skin at bedtime.  Dispense: 5 pen; Refill: 6 - glipiZIDE (GLUCOTROL XL) 10 MG 24 hr tablet; Take 2 tablets (20 mg total) by mouth daily with breakfast.  Dispense: 60 tablet; Refill: 6  2. Essential hypertension Blood pressure is on the hypotensive side Emphasized she needs to be on 10/12.5 of lisinopril/HCTZ and I have resent the prescription to the pharmacy - lisinopril-hydrochlorothiazide (ZESTORETIC) 10-12.5 MG tablet; Take 1 tablet by mouth daily.  Dispense: 30 tablet; Refill: 3  3. Pure hypercholesterolemia Uncontrolled his LDL goal is less than 100 and she is at 120 Dietary modifications emphasized We will consider switching to atorvastatin 80 mg  at next visit if LDL remains elevated - simvastatin (ZOCOR) 40 MG tablet; Take 1 tablet (40 mg total) by mouth at bedtime.  Dispense: 30 tablet; Refill: 6  4. TOBACCO ABUSE Counseled on smoking cessation-spent 3 minutes and she is working on quitting; she will be obtaining the nicotine patches   Meds ordered this encounter  Medications  . empagliflozin (JARDIANCE) 25 MG TABS tablet    Sig: Take 25 mg by mouth daily.    Dispense:  30 tablet    Refill:  2  . lisinopril-hydrochlorothiazide (ZESTORETIC) 10-12.5 MG tablet    Sig: Take 1 tablet by mouth daily.    Dispense:  30 tablet    Refill:  3    Discontinue 20/25mg   . Insulin Glargine (LANTUS SOLOSTAR) 100 UNIT/ML Solostar Pen    Sig: Inject 20 Units into the skin at bedtime.    Dispense:  5 pen    Refill:  6    Discontinue previous dose  . glipiZIDE (GLUCOTROL XL) 10 MG 24 hr tablet    Sig: Take 2 tablets (20 mg total) by mouth daily with breakfast.    Dispense:  60 tablet    Refill:  6  . simvastatin (ZOCOR) 40 MG tablet    Sig: Take 1 tablet (40 mg total) by mouth at bedtime.    Dispense:  30 tablet    Refill:  6    Follow-up: Return in about 3 months (around 01/22/2019) for Medical conditions.       Charlott Rakes, MD, FAAFP. [facility] and [place], [place]   10/22/2018, 10:20 AM

## 2018-10-22 NOTE — Progress Notes (Signed)
Patient is fasting. 

## 2018-10-22 NOTE — Patient Instructions (Signed)
Steps to Quit Smoking  Smoking tobacco can be harmful to your health and can affect almost every organ in your body. Smoking puts you, and those around you, at risk for developing many serious chronic diseases. Quitting smoking is difficult, but it is one of the best things that you can do for your health. It is never too late to quit. What are the benefits of quitting smoking? When you quit smoking, you lower your risk of developing serious diseases and conditions, such as:  Lung cancer or lung disease, such as COPD.  Heart disease.  Stroke.  Heart attack.  Infertility.  Osteoporosis and bone fractures. Additionally, symptoms such as coughing, wheezing, and shortness of breath may get better when you quit. You may also find that you get sick less often because your body is stronger at fighting off colds and infections. If you are pregnant, quitting smoking can help to reduce your chances of having a baby of low birth weight. How do I get ready to quit? When you decide to quit smoking, create a plan to make sure that you are successful. Before you quit:  Pick a date to quit. Set a date within the next two weeks to give you time to prepare.  Write down the reasons why you are quitting. Keep this list in places where you will see it often, such as on your bathroom mirror or in your car or wallet.  Identify the people, places, things, and activities that make you want to smoke (triggers) and avoid them. Make sure to take these actions: ? Throw away all cigarettes at home, at work, and in your car. ? Throw away smoking accessories, such as ashtrays and lighters. ? Clean your car and make sure to empty the ashtray. ? Clean your home, including curtains and carpets.  Tell your family, friends, and coworkers that you are quitting. Support from your loved ones can make quitting easier.  Talk with your health care provider about your options for quitting smoking.  Find out what treatment  options are covered by your health insurance. What strategies can I use to quit smoking? Talk with your healthcare provider about different strategies to quit smoking. Some strategies include:  Quitting smoking altogether instead of gradually lessening how much you smoke over a period of time. Research shows that quitting "cold turkey" is more successful than gradually quitting.  Attending in-person counseling to help you build problem-solving skills. You are more likely to have success in quitting if you attend several counseling sessions. Even short sessions of 10 minutes can be effective.  Finding resources and support systems that can help you to quit smoking and remain smoke-free after you quit. These resources are most helpful when you use them often. They can include: ? Online chats with a counselor. ? Telephone quitlines. ? Printed self-help materials. ? Support groups or group counseling. ? Text messaging programs. ? Mobile phone applications.  Taking medicines to help you quit smoking. (If you are pregnant or breastfeeding, talk with your health care provider first.) Some medicines contain nicotine and some do not. Both types of medicines help with cravings, but the medicines that include nicotine help to relieve withdrawal symptoms. Your health care provider may recommend: ? Nicotine patches, gum, or lozenges. ? Nicotine inhalers or sprays. ? Non-nicotine medicine that is taken by mouth. Talk with your health care provider about combining strategies, such as taking medicines while you are also receiving in-person counseling. Using these two strategies together makes you   more likely to succeed in quitting than if you used either strategy on its own. If you are pregnant or breastfeeding, talk with your health care provider about finding counseling or other support strategies to quit smoking. Do not take medicine to help you quit smoking unless told to do so by your health care  provider. What things can I do to make it easier to quit? Quitting smoking might feel overwhelming at first, but there is a lot that you can do to make it easier. Take these important actions:  Reach out to your family and friends and ask that they support and encourage you during this time. Call telephone quitlines, reach out to support groups, or work with a counselor for support.  Ask people who smoke to avoid smoking around you.  Avoid places that trigger you to smoke, such as bars, parties, or smoke-break areas at work.  Spend time around people who do not smoke.  Lessen stress in your life, because stress can be a smoking trigger for some people. To lessen stress, try: ? Exercising regularly. ? Deep-breathing exercises. ? Yoga. ? Meditating. ? Performing a body scan. This involves closing your eyes, scanning your body from head to toe, and noticing which parts of your body are particularly tense. Purposefully relax the muscles in those areas.  Download or purchase mobile phone or tablet apps (applications) that can help you stick to your quit plan by providing reminders, tips, and encouragement. There are many free apps, such as QuitGuide from the CDC (Centers for Disease Control and Prevention). You can find other support for quitting smoking (smoking cessation) through smokefree.gov and other websites. How will I feel when I quit smoking? Within the first 24 hours of quitting smoking, you may start to feel some withdrawal symptoms. These symptoms are usually most noticeable 2-3 days after quitting, but they usually do not last beyond 2-3 weeks. Changes or symptoms that you might experience include:  Mood swings.  Restlessness, anxiety, or irritation.  Difficulty concentrating.  Dizziness.  Strong cravings for sugary foods in addition to nicotine.  Mild weight gain.  Constipation.  Nausea.  Coughing or a sore throat.  Changes in how your medicines work in your  body.  A depressed mood.  Difficulty sleeping (insomnia). After the first 2-3 weeks of quitting, you may start to notice more positive results, such as:  Improved sense of smell and taste.  Decreased coughing and sore throat.  Slower heart rate.  Lower blood pressure.  Clearer skin.  The ability to breathe more easily.  Fewer sick days. Quitting smoking is very challenging for most people. Do not get discouraged if you are not successful the first time. Some people need to make many attempts to quit before they achieve long-term success. Do your best to stick to your quit plan, and talk with your health care provider if you have any questions or concerns. This information is not intended to replace advice given to you by your health care provider. Make sure you discuss any questions you have with your health care provider. Document Released: 04/10/2001 Document Revised: 11/20/2016 Document Reviewed: 08/31/2014 Elsevier Interactive Patient Education  2019 Elsevier Inc.  

## 2018-10-27 MED FILL — glipiZIDE XL 10 MG TB24: 10 | 30 days supply | Qty: 60 | Fill #0

## 2018-10-27 MED FILL — ?SIMVASTATIN 40 MG TABS: 40 | 30 days supply | Qty: 30 | Fill #0

## 2018-12-02 MED FILL — $LANTUS SOLOSTAR 100 UNITS/: 100 | 30 days supply | Qty: 6 | Fill #1

## 2018-12-02 MED FILL — LISINOPRIL-HCTZ 10-12.5 MG: 10-12.5 | 30 days supply | Qty: 30 | Fill #1

## 2018-12-02 MED FILL — ?SIMVASTATIN 40 MG TABS: 40 | 30 days supply | Qty: 30 | Fill #1

## 2018-12-08 ENCOUNTER — Other Ambulatory Visit: Payer: Self-pay

## 2018-12-08 ENCOUNTER — Encounter: Payer: Self-pay | Admitting: Family Medicine

## 2018-12-08 ENCOUNTER — Ambulatory Visit (INDEPENDENT_AMBULATORY_CARE_PROVIDER_SITE_OTHER): Payer: Self-pay | Admitting: Family Medicine

## 2018-12-08 DIAGNOSIS — N951 Menopausal and female climacteric states: Secondary | ICD-10-CM

## 2018-12-08 DIAGNOSIS — Z794 Long term (current) use of insulin: Secondary | ICD-10-CM

## 2018-12-08 DIAGNOSIS — E1100 Type 2 diabetes mellitus with hyperosmolarity without nonketotic hyperglycemic-hyperosmolar coma (NKHHC): Secondary | ICD-10-CM

## 2018-12-08 DIAGNOSIS — F17200 Nicotine dependence, unspecified, uncomplicated: Secondary | ICD-10-CM

## 2018-12-08 MED ORDER — AGAMATRIX PRESTO PRO METER DEVI: 1.0000 [IU] | Freq: Two times a day (BID) | 0 refills | Status: AC

## 2018-12-08 MED ORDER — AGAMATRIX PRESTO TEST VI STRP: ORAL_STRIP | 12 refills | Status: DC

## 2018-12-08 MED ORDER — TRUEPLUS LANCETS 28G MISC: 1.0000 | Freq: Three times a day (TID) | 12 refills | Status: DC

## 2018-12-08 NOTE — Progress Notes (Signed)
[name] Phone: [phone]     Haley Mendez - 63 y.o. female MRN [id number]  Date of birth: 03-11-1956  Subjective:   cc: Diabetes  HPI:  Diabetes: Patient has had diabetes for several years.  Patient was put on insulin last March due to worsening blood glucose control.  She stopped taking metformin because she did not like the side effects, which she described as weight loss.  She also takes glipizide, which she has taken for years.  She also takes Jardiance as well.  Patient's previous primary care doctor recently tried to get her to increase her Lantus from 15 to 20 units, but the patient has not done this yet.  Patient does not measure her blood sugar at home.  She also takes lisinopril-HCTZ, which was recently changed to a smaller dose due to decreased blood pressure.  Patient does not currently have insurance.  She was working part-time at [employer], but has not done so since the pandemic started.  She lives alone.  Sweating: Patient states she breaks out in sweats a few times a day.  She says she wakes up sweating at night.  She said this is been going on for several years.  Her last menstrual cycle was 12 years ago.  Smoking: Patient smokes a half a pack a day and would like to quit, but cannot afford the nicotine gum or patch.  She does not drink alcohol except for 1-2 drinks a week at most.  She does not use drug .  Family history of diabetes with her father and brother, both dying of diabetes complications. Family history of coronary artery disease, with mother and sister both dying of myocardial infarction.   ROS: See HPI for pertinent positives and negatives  Past Medical History  Family history reviewed for today's visit. No changes.  Social history- patient is a current smoker  Health Maintenance:   Health Maintenance Due  Topic Date Due  . FOOT EXAM  05/29/2018  . INFLUENZA VACCINE  11/29/2018     Objective:   BP 102/62    Pulse 69   Wt 131 lb 9.6 oz (59.7 kg)   SpO2 99%   BMI 23.31 kg/m  Gen: NAD, alert and oriented, cooperative with exam HEENT: NCAT, EOMI, MMM Neck: FROM, supple, no masses CV: normal rate, regular rhythm. No murmurs, no rubs.  Resp: LCTAB, no wheezes, crackles. normal work of breathing GI: nontender to palpation, BS present, no guarding or organomegaly Msk: No edema, warm, normal tone, moves UE/LE spontaneously Neuro: CN II-XII grossly intact. no gross deficits Skin: No rashes, no lesions Psych: Appropriate behavior  Assessment/Plan:   Diabetes (HCC) On Jardiance, glipizide, Lantus.  Not willing to take metformin.  Patient not measuring her blood sugar at home.  States she does not have lancets.  A1c was 8.36-month ago. - [facility] in [facility] lancets and [facility] at [facility] department.  Patient has orange card. - Advised patient to take the 15 units of Lantus and then increase to 20 if her blood sugar is over 200 during her next early morning fasting blood glucose reading -Advised patient to check blood sugar at night before delivering Lantus as well as in the morning before breakfast. -Advised patient to keep a log of her blood sugar readings, and bring it into her next appointment in 2 weeks.  MENOPAUSE-RELATED VASOMOTOR SYMPTOMS, HOT FLASHES Patient has been having several years of hot flashes and sweating at night.  Did not discuss  possible treatments during this visit, but will need to at the next visit.  TOBACCO ABUSE Patient expresses desire to quit, but cannot afford nicotine replacement therapy.  Informed patient of the quit line which will provide her with free nicotine replacement for a month after she calls them.   Meds ordered this encounter  Medications  . Blood Glucose Monitoring Suppl (AGAMATRIX PRESTO PRO METER) DEVI    Sig: 1 Units by Does not apply route 2 (two) times daily at 8 am and 10 pm.    Dispense:  1 Units    Refill:  0  . TRUEplus Lancets 28G MISC     Sig: 1 each by Does not apply route 3 (three) times daily before meals.    Dispense:  100 each    Refill:  12  . glucose blood (AGAMATRIX PRESTO TEST) test strip    Sig: Use as instructed    Dispense:  100 each    Refill:  12    Clemetine Marker, MD PGY-2 [facility]

## 2018-12-08 NOTE — Assessment & Plan Note (Signed)
Patient has been having several years of hot flashes and sweating at night.  Did not discuss possible treatments during this visit, but will need to at the next visit.

## 2018-12-08 NOTE — Assessment & Plan Note (Addendum)
On Jardiance, glipizide, Lantus.  Not willing to take metformin.  Patient not measuring her blood sugar at home.  States she does not have lancets.  A1c was 8.39-month ago. - Sent in prescription for lancets and meter at [facility].  Patient has orange card. - Advised patient to take the 15 units of Lantus and then increase to 20 if her blood sugar is over 200 during her next early morning fasting blood glucose reading -Advised patient to check blood sugar at night before delivering Lantus as well as in the morning before breakfast. -Advised patient to keep a log of her blood sugar readings, and bring it into her next appointment in 2 weeks.

## 2018-12-08 NOTE — Assessment & Plan Note (Signed)
Patient expresses desire to quit, but cannot afford nicotine replacement therapy.  Informed patient of the quit line which will provide her with free nicotine replacement for a month after she calls them.

## 2018-12-08 NOTE — Patient Instructions (Addendum)
I would like you to take your blood sugar before giving yourself Lantus at night and then again in the morning before breakfast.  Start out using the 15 units, but if you are blood sugars are above 200 in the morning I want you to increase to 20 units.  I would like you to record your blood sugar readings every day in a journal or notebook and then bring these to your next visit.  I would like you to come back in 2 weeks so you look at these to discuss changing your insulin levels.  Please schedule an appointment in 2 weeks so that we can further discuss your diabetes management.  You can call the smoking cessation quit line at 1 800 quit now [phone]) and they can provide you with a months worth of nicotine gum or patches.  Have a great day,  Clemetine Marker, MD

## 2018-12-17 ENCOUNTER — Encounter: Payer: Self-pay | Admitting: Family Medicine

## 2018-12-17 ENCOUNTER — Other Ambulatory Visit: Payer: Self-pay

## 2018-12-17 ENCOUNTER — Ambulatory Visit (INDEPENDENT_AMBULATORY_CARE_PROVIDER_SITE_OTHER): Payer: Self-pay | Admitting: Family Medicine

## 2018-12-17 ENCOUNTER — Other Ambulatory Visit (HOSPITAL_COMMUNITY)
Admission: RE | Admit: 2018-12-17 | Discharge: 2018-12-17 | Disposition: A | Discharge status: Home / Self Care | Payer: Self-pay | Source: Ambulatory Visit | Attending: Family Medicine | Admitting: Family Medicine

## 2018-12-17 VITALS — BP 124/60 | HR 66

## 2018-12-17 DIAGNOSIS — Z124 Encounter for screening for malignant neoplasm of cervix: Secondary | ICD-10-CM

## 2018-12-17 DIAGNOSIS — N898 Other specified noninflammatory disorders of vagina: Secondary | ICD-10-CM

## 2018-12-17 LAB — POCT WET PREP (WET MOUNT)
Clue Cells Wet Prep Whiff POC: POSITIVE
WBC, Wet Prep HPF POC: >20

## 2018-12-17 NOTE — Progress Notes (Signed)
   [facility] Phone: [phone]     Haley Mendez - 62 y.o. female MRN [id number]  Date of birth: 09-Jun-1955  Subjective:   cc: Vaginal discharge/odor  HPI:  Discharge: She states the discharge is "brownish" with a "foul smell".  This is been going on since last Thursday.  She had sexual intercourse the week before on August 5.  She has had sexual intercourse about once every 2 weeks with the same person.  They do not use condoms.  She has no vaginal pain or dysuria or pruritus.  She denies fever.  ROS: See HPI for pertinent positives and negatives  Past Medical History  Family history reviewed for today's visit. No changes.  Health Maintenance:  -  Health Maintenance Due  Topic Date Due  . FOOT EXAM  05/29/2018  . INFLUENZA VACCINE  11/29/2018  . OPHTHALMOLOGY EXAM  12/11/2018    -  reports that she has been smoking cigarettes. She has a 7.50 pack-year smoking history. She has never used smokeless tobacco.  Objective:   BP 124/60   Pulse 66   SpO2 99%  Gen: NAD, alert and oriented, cooperative with exam GI: nontender to palpation, BS present, no guarding or organomegaly gu: No lesions.  Moderate amount of greenish vaginal discharge coming from the cervix and surrounding area. Psych: Appropriate behavior  Assessment/Plan:   Vaginal discharge 5 days of vaginal discharge.  Has been having intercourse with the same partner.  Denies any other symptoms other than odor.  Based on physical exam in the color of the discharge I suspect she may have trichomonas, but will wait for results of wet prep and other testing. - Wet prep, GC/CT, HIV, RPR - Treat appropriately with antibiotics after results come back.   Orders Placed This Encounter  Procedures  . RPR  . HIV Antibody (routine testing w rflx)  . POCT Wet Prep [facility])   Clemetine Marker, MD PGY-2 [facility]

## 2018-12-17 NOTE — Patient Instructions (Signed)
I will call you with the results of the wet prep today and if it is positive I will prescribe you medication.  Otherwise we will wait for the rest of the lab results.  In the future please remember to use condoms when having sexual intercourse.  Every day,  Clemetine Marker

## 2018-12-18 ENCOUNTER — Other Ambulatory Visit: Payer: Self-pay | Admitting: Family Medicine

## 2018-12-18 DIAGNOSIS — N898 Other specified noninflammatory disorders of vagina: Secondary | ICD-10-CM | POA: Insufficient documentation

## 2018-12-18 LAB — CERVICOVAGINAL ANCILLARY ONLY
Chlamydia: NEGATIVE
Neisseria Gonorrhea: NEGATIVE

## 2018-12-18 LAB — HIV ANTIBODY (ROUTINE TESTING W REFLEX): HIV Screen 4th Generation wRfx: NONREACTIVE

## 2018-12-18 LAB — RPR: RPR Ser Ql: NONREACTIVE

## 2018-12-18 MED ORDER — METRONIDAZOLE 500 MG PO TABS: 500.0000 mg | ORAL_TABLET | Freq: Two times a day (BID) | ORAL | 0 refills | Status: AC

## 2018-12-18 NOTE — Progress Notes (Signed)
Called pt to inform her of positive BV and trichomoniasis results.  Put in Rx to health dept for metronidazole for 7 days.

## 2018-12-18 NOTE — Assessment & Plan Note (Signed)
5 days of vaginal discharge.  Has been having intercourse with the same partner.  Denies any other symptoms other than odor.  Based on physical exam in the color of the discharge I suspect she may have trichomonas, but will wait for results of wet prep and other testing. - Wet prep, GC/CT, HIV, RPR - Treat appropriately with antibiotics after results come back.

## 2018-12-19 LAB — CYTOLOGY - PAP: Diagnosis: NEGATIVE

## 2018-12-23 ENCOUNTER — Encounter: Payer: Self-pay | Admitting: Family Medicine

## 2018-12-23 ENCOUNTER — Telehealth: Payer: Self-pay | Admitting: Family Medicine

## 2018-12-23 NOTE — Telephone Encounter (Signed)
Called patient and left voicemail to inform her that her Pap smear was negative.  She does not need to get any more Pap smears given that she is 63 years old currently.

## 2018-12-24 ENCOUNTER — Telehealth: Payer: Self-pay

## 2018-12-24 NOTE — Telephone Encounter (Signed)
Pt received message about pap but would like to know what the other lab work showed. Please call [phone].  Ottis Stain, CMA

## 2018-12-26 ENCOUNTER — Ambulatory Visit: Payer: Self-pay

## 2018-12-26 ENCOUNTER — Other Ambulatory Visit: Payer: Self-pay

## 2018-12-29 NOTE — Telephone Encounter (Signed)
Informed pt of her results. 

## 2019-01-29 ENCOUNTER — Other Ambulatory Visit: Payer: Self-pay

## 2019-01-29 DIAGNOSIS — E1100 Type 2 diabetes mellitus with hyperosmolarity without nonketotic hyperglycemic-hyperosmolar coma (NKHHC): Secondary | ICD-10-CM

## 2019-01-29 DIAGNOSIS — Z794 Long term (current) use of insulin: Secondary | ICD-10-CM

## 2019-01-30 MED ORDER — JARDIANCE 25 MG PO TABS: 25.0000 mg | ORAL_TABLET | Freq: Every day | ORAL | 2 refills | Status: DC

## 2019-01-30 MED ORDER — LANTUS SOLOSTAR 100 UNIT/ML ~~LOC~~ SOPN: 20.0000 [IU] | PEN_INJECTOR | Freq: Every day | SUBCUTANEOUS | 6 refills | Status: DC

## 2019-02-05 ENCOUNTER — Ambulatory Visit: Payer: Self-pay

## 2019-02-16 ENCOUNTER — Other Ambulatory Visit: Payer: Self-pay

## 2019-02-16 ENCOUNTER — Ambulatory Visit: Payer: Self-pay

## 2019-02-18 ENCOUNTER — Telehealth: Payer: Self-pay | Admitting: *Deleted

## 2019-02-18 DIAGNOSIS — E1100 Type 2 diabetes mellitus with hyperosmolarity without nonketotic hyperglycemic-hyperosmolar coma (NKHHC): Secondary | ICD-10-CM

## 2019-02-18 MED ORDER — GLIPIZIDE ER 10 MG PO TB24: 20.0000 mg | ORAL_TABLET | Freq: Every day | ORAL | 6 refills | Status: DC

## 2019-02-18 NOTE — Telephone Encounter (Signed)
[facility] calls nurse line stating they do not carry Glipizide XL, only plain Glipizide. Ok to change? Or otherwise will have to go to another pharmacy.

## 2019-02-19 NOTE — Telephone Encounter (Signed)
LVM on pharmacy line informing of change.

## 2019-02-19 NOTE — Telephone Encounter (Signed)
Yes, this should be fine.

## 2019-03-18 ENCOUNTER — Other Ambulatory Visit: Payer: Self-pay | Admitting: Family Medicine

## 2019-03-19 ENCOUNTER — Other Ambulatory Visit: Payer: Self-pay

## 2019-03-19 DIAGNOSIS — I10 Essential (primary) hypertension: Secondary | ICD-10-CM

## 2019-03-19 MED ORDER — LISINOPRIL-HYDROCHLOROTHIAZIDE 10-12.5 MG PO TABS: 1.0000 | ORAL_TABLET | Freq: Every day | ORAL | 3 refills | Status: DC

## 2019-03-19 NOTE — Telephone Encounter (Signed)
Can we call this pt to see if she can schedule an appt.  Looks like the HTN meds were prescribed by a different provider.  I'd like to get some updated labs on her too.

## 2019-03-20 ENCOUNTER — Ambulatory Visit: Payer: Self-pay | Admitting: Family Medicine

## 2019-06-17 ENCOUNTER — Other Ambulatory Visit: Payer: Self-pay | Admitting: Family Medicine

## 2019-06-17 DIAGNOSIS — E7800 Pure hypercholesterolemia, unspecified: Secondary | ICD-10-CM

## 2019-06-19 ENCOUNTER — Other Ambulatory Visit: Payer: Self-pay | Admitting: Family Medicine

## 2019-06-19 DIAGNOSIS — E7800 Pure hypercholesterolemia, unspecified: Secondary | ICD-10-CM

## 2019-06-24 ENCOUNTER — Other Ambulatory Visit: Payer: Self-pay | Admitting: *Deleted

## 2019-06-24 DIAGNOSIS — E7800 Pure hypercholesterolemia, unspecified: Secondary | ICD-10-CM

## 2019-06-24 MED ORDER — SIMVASTATIN 40 MG PO TABS: 40.0000 mg | ORAL_TABLET | Freq: Every day | ORAL | 3 refills | Status: DC

## 2019-07-17 ENCOUNTER — Other Ambulatory Visit: Payer: Self-pay | Admitting: Family Medicine

## 2019-07-17 DIAGNOSIS — I10 Essential (primary) hypertension: Secondary | ICD-10-CM

## 2019-07-20 ENCOUNTER — Other Ambulatory Visit: Payer: Self-pay | Admitting: Family Medicine

## 2019-07-20 DIAGNOSIS — I10 Essential (primary) hypertension: Secondary | ICD-10-CM

## 2019-07-30 ENCOUNTER — Encounter: Payer: Self-pay | Admitting: Family Medicine

## 2019-07-30 ENCOUNTER — Other Ambulatory Visit: Payer: Self-pay

## 2019-07-30 ENCOUNTER — Ambulatory Visit (INDEPENDENT_AMBULATORY_CARE_PROVIDER_SITE_OTHER): Payer: Self-pay | Admitting: Family Medicine

## 2019-07-30 VITALS — BP 112/72 | HR 64 | Ht 63.0 in | Wt 138.0 lb

## 2019-07-30 DIAGNOSIS — Z794 Long term (current) use of insulin: Secondary | ICD-10-CM

## 2019-07-30 DIAGNOSIS — E1100 Type 2 diabetes mellitus with hyperosmolarity without nonketotic hyperglycemic-hyperosmolar coma (NKHHC): Secondary | ICD-10-CM

## 2019-07-30 DIAGNOSIS — E7800 Pure hypercholesterolemia, unspecified: Secondary | ICD-10-CM

## 2019-07-30 DIAGNOSIS — I10 Essential (primary) hypertension: Secondary | ICD-10-CM

## 2019-07-30 LAB — POCT GLYCOSYLATED HEMOGLOBIN (HGB A1C): HbA1c, POC (controlled diabetic range): 8.3 % — AB (ref 0.0–7.0)

## 2019-07-30 MED ORDER — SIMVASTATIN 40 MG PO TABS: 40.0000 mg | ORAL_TABLET | Freq: Every day | ORAL | 3 refills | Status: DC

## 2019-07-30 MED ORDER — LISINOPRIL-HYDROCHLOROTHIAZIDE 20-25 MG PO TABS: 0.5000 | ORAL_TABLET | Freq: Every day | ORAL | 1 refills | Status: DC

## 2019-07-30 MED ORDER — GLIPIZIDE ER 10 MG PO TB24: 20.0000 mg | ORAL_TABLET | Freq: Every day | ORAL | 6 refills | Status: DC

## 2019-07-30 NOTE — Assessment & Plan Note (Signed)
Tolerating her statin. Compliant.   - lipid panel.  - continue simvastatin

## 2019-07-30 NOTE — Progress Notes (Signed)
    SUBJECTIVE:   CHIEF COMPLAINT / HPI:   DM: Patient is taking her Jardiance, glipizide, and Lantus regularly, which she gets from the health department.  She took Metformin 1000 mg once a day in the past, but did not like taking this medication because it "made her lose weight".  She admits to poor dietary habits including eating lots of sweets and sugary beverages.  She does not have a glucometer at home because she states she cannot afford it.  She says she will have Medicare when she turns 64 in a few months.  She says that her feet sometimes go numb and feel "cold"  HTN: Patient takes her medication every day.  She has to cut the pill in half and uses a steak knife to cut it.  She says this cuts it unevenly and would like a medicine she does not have to cut up.  HLD: She takes her simvastatin nightly.  She has not had any muscle cramps or pain.   PERTINENT  PMH / PSH: Diabetes, hypertension, hyperlipidemia  OBJECTIVE:   BP 112/72   Pulse 64   Ht 5\' 3"  (1.6 m)   Wt 138 lb (62.6 kg)   SpO2 99%   BMI 24.45 kg/m   General: Alert and oriented, no acute distress CV: Regular rate and rhythm, no murmurs Pulmonary: Lungs clear to auscultation bilaterally, no wheezes or crackles Extremities: Normal DP and PT pulses bilaterally.  Decreased sensation to pain in the toes bilaterally.  ASSESSMENT/PLAN:   Diabetes (HCC) Currently taking glipizide, jardiance, and lantus 20U qhs.  A1c is 8.3, same as last visit.  Pt admits to poor diet with sweets and sugary beverages.  Pt does not want to take metformin as it made her lose too much weight last time. Encouraged pt to restart her metformin but she does not want to currently.  Pt does not have a gluocometer so we cannot titrate up her lantus at this time.  Gave the patient information on a $20 glucometer at [place] and advised her to let our office know if she gets it so we can start titrating lantus.  Otherwise, will need to wait until she gets  medicare before we can get a glucometer for her.  Advised pt to come back for f/u in 3 months after she is elilgible for medicare.   Hypertension Well controlled today on current HCTZ/lisinopril.  Gave pt info on pill cutter to help her cut this medication.  She states health dept does not have lower dose than what is currently prescribed.   - bmp.   - continue home meds  Hyperlipidemia Tolerating her statin. Compliant.   - lipid panel.  - continue simvastatin     Benay Pike, MD [place]

## 2019-07-30 NOTE — Assessment & Plan Note (Signed)
Currently taking glipizide, jardiance, and lantus 20U qhs.  A1c is 8.3, same as last visit.  Pt admits to poor diet with sweets and sugary beverages.  Pt does not want to take metformin as it made her lose too much weight last time. Encouraged pt to restart her metformin but she does not want to currently.  Pt does not have a gluocometer so we cannot titrate up her lantus at this time.  Gave the patient information on a $20 glucometer at [place] and advised her to let our office know if she gets it so we can start titrating lantus.  Otherwise, will need to wait until she gets medicare before we can get a glucometer for her.  Advised pt to come back for f/u in 3 months after she is elilgible for medicare.

## 2019-07-30 NOTE — Patient Instructions (Addendum)
Your blood sugar is still slightly above what we would like you to be.  The best way to control your blood sugar is with diet.  This includes avoiding foods such as rice, breads, potatoes, sugary beverages including sodas, juices, sweet tea.  Diet (0-calorie) drinks are perfectly safe and will not affect your blood sugar.  I would like you to reconsider taking your Metformin at a lower dose.  Taking metformin will help control your blood sugar without having to increase insulin.  We did not have any blood glucose meters here today, but you can pick them up at [facility] for less than $20.  I have included a picture of a $9 meter at [facility].  It will cost another $9 approximately for the strips and needle.  I would like you to get this before we do any increasing of your Lantus because I will need to know why your morning fasting blood sugars are so that we do not give you too much insulin.    Below is also a picture of a pill cutter that you can get at any pharmacy.  This will help you more evenly cut up your blood pressure medicine in half.    I would like to see you back in 3 months after you have received Medicare so that we can better improve your blood sugar.

## 2019-07-30 NOTE — Assessment & Plan Note (Signed)
Well controlled today on current HCTZ/lisinopril.  Gave pt info on pill cutter to help her cut this medication.  She states health dept does not have lower dose than what is currently prescribed.   - bmp.   - continue home meds

## 2019-07-31 LAB — LIPID PANEL
Chol/HDL Ratio: 4.1 ratio (ref 0.0–4.4)
Cholesterol, Total: 199 mg/dL (ref 100–199)
HDL: 48 mg/dL (ref >39)
LDL Chol Calc (NIH): 134 mg/dL — ABNORMAL HIGH (ref 0–99)
Triglycerides: 96 mg/dL (ref 0–149)
VLDL Cholesterol Cal: 17 mg/dL (ref 5–40)

## 2019-07-31 LAB — BASIC METABOLIC PANEL
BUN/Creatinine Ratio: 20 (ref 12–28)
BUN: 20 mg/dL (ref 8–27)
CO2: 23 mmol/L (ref 20–29)
Calcium: 9.8 mg/dL (ref 8.7–10.3)
Chloride: 108 mmol/L — ABNORMAL HIGH (ref 96–106)
Creatinine, Ser: 1.01 mg/dL — ABNORMAL HIGH (ref 0.57–1.00)
GFR calc Af Amer: 68 mL/min/{1.73_m2} (ref >59)
GFR calc non Af Amer: 59 mL/min/{1.73_m2} — ABNORMAL LOW (ref >59)
Glucose: 65 mg/dL (ref 65–99)
Potassium: 4 mmol/L (ref 3.5–5.2)
Sodium: 143 mmol/L (ref 134–144)

## 2019-10-07 ENCOUNTER — Ambulatory Visit: Payer: Self-pay | Admitting: Family Medicine

## 2019-10-07 ENCOUNTER — Other Ambulatory Visit: Payer: Self-pay

## 2019-10-07 DIAGNOSIS — E1100 Type 2 diabetes mellitus with hyperosmolarity without nonketotic hyperglycemic-hyperosmolar coma (NKHHC): Secondary | ICD-10-CM

## 2019-10-07 NOTE — Progress Notes (Deleted)
    SUBJECTIVE:   CHIEF COMPLAINT / HPI:   Diabetes: glucometer?   HTN: pill cutter?   PERTINENT  PMH / PSH: ***  OBJECTIVE:   There were no vitals taken for this visit.  ***  ASSESSMENT/PLAN:   No problem-specific Assessment & Plan notes found for this encounter.     Benay Pike, MD [place]

## 2019-10-07 NOTE — Telephone Encounter (Signed)
Patient needs new prescriptions for Jardiance and Glipizide sent to [facility] on [address], as this is now her preferred pharmacy. I have updated epic.

## 2019-10-09 MED ORDER — EMPAGLIFLOZIN 25 MG PO TABS: 25.0000 mg | ORAL_TABLET | Freq: Every day | ORAL | 2 refills | Status: DC

## 2019-10-09 MED ORDER — GLIPIZIDE ER 10 MG PO TB24: 20.0000 mg | ORAL_TABLET | Freq: Every day | ORAL | 6 refills | Status: DC

## 2019-12-01 ENCOUNTER — Other Ambulatory Visit: Payer: Self-pay | Admitting: *Deleted

## 2019-12-01 DIAGNOSIS — I10 Essential (primary) hypertension: Secondary | ICD-10-CM

## 2019-12-02 MED ORDER — LISINOPRIL-HYDROCHLOROTHIAZIDE 20-25 MG PO TABS: 0.5000 | ORAL_TABLET | Freq: Every day | ORAL | 1 refills | Status: DC

## 2019-12-04 ENCOUNTER — Other Ambulatory Visit: Payer: Self-pay | Admitting: Family Medicine

## 2019-12-04 DIAGNOSIS — Z1231 Encounter for screening mammogram for malignant neoplasm of breast: Secondary | ICD-10-CM

## 2019-12-10 ENCOUNTER — Other Ambulatory Visit: Payer: Self-pay

## 2019-12-10 ENCOUNTER — Ambulatory Visit
Admission: RE | Admit: 2019-12-10 | Discharge: 2019-12-10 | Disposition: A | Discharge status: Home / Self Care | Payer: Medicare Other | Source: Ambulatory Visit | Attending: *Deleted | Admitting: *Deleted

## 2019-12-10 DIAGNOSIS — Z1231 Encounter for screening mammogram for malignant neoplasm of breast: Secondary | ICD-10-CM

## 2019-12-31 ENCOUNTER — Ambulatory Visit: Payer: Medicare Other | Admitting: Family Medicine

## 2020-01-06 ENCOUNTER — Other Ambulatory Visit: Payer: Self-pay | Admitting: Family Medicine

## 2020-01-06 DIAGNOSIS — E1100 Type 2 diabetes mellitus with hyperosmolarity without nonketotic hyperglycemic-hyperosmolar coma (NKHHC): Secondary | ICD-10-CM

## 2020-01-12 ENCOUNTER — Other Ambulatory Visit: Payer: Self-pay | Admitting: Family Medicine

## 2020-01-12 DIAGNOSIS — E1100 Type 2 diabetes mellitus with hyperosmolarity without nonketotic hyperglycemic-hyperosmolar coma (NKHHC): Secondary | ICD-10-CM

## 2020-01-13 NOTE — Telephone Encounter (Signed)
Can we call this pt to ask her if she has enrolled in medicare yet? She recently turned 61 and on our last visit I wanted her to schedule an appointment with me after she gets medicare but I do not see any future appointments scheduled.

## 2020-01-19 NOTE — Telephone Encounter (Signed)
Contacted pt and appointment scheduled.Caitlan Chauca Zimmerman Rumple, CMA  

## 2020-01-28 ENCOUNTER — Other Ambulatory Visit: Payer: Self-pay

## 2020-01-28 ENCOUNTER — Telehealth: Payer: Self-pay | Admitting: *Deleted

## 2020-01-28 ENCOUNTER — Ambulatory Visit (INDEPENDENT_AMBULATORY_CARE_PROVIDER_SITE_OTHER): Payer: Medicare Other | Admitting: Family Medicine

## 2020-01-28 VITALS — BP 100/70 | HR 70 | Ht 62.0 in | Wt 134.2 lb

## 2020-01-28 DIAGNOSIS — Z23 Encounter for immunization: Secondary | ICD-10-CM | POA: Diagnosis not present

## 2020-01-28 DIAGNOSIS — E1100 Type 2 diabetes mellitus with hyperosmolarity without nonketotic hyperglycemic-hyperosmolar coma (NKHHC): Secondary | ICD-10-CM

## 2020-01-28 DIAGNOSIS — E7800 Pure hypercholesterolemia, unspecified: Secondary | ICD-10-CM | POA: Diagnosis not present

## 2020-01-28 DIAGNOSIS — Z794 Long term (current) use of insulin: Secondary | ICD-10-CM

## 2020-01-28 DIAGNOSIS — Z0000 Encounter for general adult medical examination without abnormal findings: Secondary | ICD-10-CM

## 2020-01-28 DIAGNOSIS — R1013 Epigastric pain: Secondary | ICD-10-CM

## 2020-01-28 DIAGNOSIS — Z13820 Encounter for screening for osteoporosis: Secondary | ICD-10-CM | POA: Diagnosis not present

## 2020-01-28 LAB — POCT GLYCOSYLATED HEMOGLOBIN (HGB A1C): HbA1c, POC (controlled diabetic range): 9.9 % — AB (ref 0.0–7.0)

## 2020-01-28 MED ORDER — ROSUVASTATIN CALCIUM 20 MG PO TABS: 20.0000 mg | ORAL_TABLET | Freq: Every day | ORAL | 1 refills | Status: DC

## 2020-01-28 MED ORDER — SEMAGLUTIDE (1 MG/DOSE) 2 MG/1.5ML ~~LOC~~ SOPN: 0.5000 mg | PEN_INJECTOR | SUBCUTANEOUS | 1 refills | Status: DC

## 2020-01-28 MED ORDER — FAMOTIDINE 20 MG PO TABS: ORAL_TABLET | ORAL | 0 refills | Status: DC

## 2020-01-28 NOTE — Telephone Encounter (Signed)
Received fax requesting to send a new Rx for new pack size Ozempic 4MG /ML, this was from the Rx you sent today. Jabez Molner Zimmerman Rumple, CMA

## 2020-01-28 NOTE — Progress Notes (Signed)
    SUBJECTIVE:   CHIEF COMPLAINT / HPI:   DM: patient states she is compliant with her medications including insulin and jardiance.  She doesn't take metformin because she felt it was making her lose weight.  She likes to eat sweets and sugary drinks. She is willing to go to the ophthalmologist.  Dr. Victorino December? is her current eye doctor. She lost her previous blood glucose meter from last visit.    HM: pt is willing to get all the health maintenance screenings/vaccinations available to her now that she has medicare.  She is willing to get a DEXA scan, flu vaccine, PNA vaccine, and already has had her mammogram and has an appointment with the gastroenterologist for a colonoscopy.    HLD: pt states she is compliant with her current statin.  She is willing to switch to a higher intensity statin to help better control her ldl.    Dyspepsia: pt has had worsening gas/bloating, hunger, and epigastric pain recently.  She says she has a history of gallstones seen on previous imaging.  No n/v after meals or RUQ pain.  She is not currently taking antacid medications.   PERTINENT  PMH / PSH:   OBJECTIVE:   BP 100/70   Pulse 70   Ht 5\' 2"  (1.575 m)   Wt 134 lb 3.2 oz (60.9 kg)   SpO2 98%   BMI 24.55 kg/m   Gen: alert, oriented. No acute distress.  Cv: RRR. No murmurs Pulm: LCTAB. No wheezes.  Ext: no pitting edema.   Abd: soft, TTP epigastrically.  Normal bowel sounds.   ASSESSMENT/PLAN:   Diabetes ([place]) Poorly controlled.  On lantus, glipizide, and jardiance.  Endorses compliance but a1c has worsened since starting jardiance.  Discussed with pt dietary habits and limiting carbohydrates.  Also discussed that all diabetic medications will likely result in some level of wweight loss and this is not a bad thing in the context of diabetes.  Pt willing to try once weekly glp1 injection.   - start semaglutide.  If a1c improves with this consider discontinuing the glipizide.    Hyperlipidemia Recent  lipid panel showed ldl not at goal on simvastatin.  Will d/c simvastatin and start rosuvastatin 20mg .  Recheck in 1-2 months.   Dyspepsia Not currently taking antacid.  Complaining of gas/bloating/epigastric pain.  Will get h. Pylori breath test.  If negative will discuss gi referral for egd.  Will start pepcid today.   Healthcare maintenance Already has PNA vaccinations, covid,  and shingles vaccine.  Scheduled for colonoscopy.   -dexa scan ordered - flu shot given - cbc, bmp     Benay Pike, MD [place]

## 2020-01-28 NOTE — Patient Instructions (Addendum)
It was nice to see you again today,  We made the following changes today:  For your diabetes -We started a once weekly injection of semaglutide -We discussed avoiding sugary foods and drinks -We referred you to ophthalmology for diabetic retinopathy screening  For your dyspepsia -We will get an H. pylori test today.  I will follow up with results with you. -You can start taking Pepcid today.  Please follow the directions on the medication.  Take 40 mg for 2 weeks then 20 mg for 1 week and then as needed.  We we will put in referral for the DEXA scan to check for osteoporosis if we switched your statin from simvastatin to rosuvastatin.  Please do not take the simvastatin anymore.  I will follow up with the results of your blood test.  Have a great day,  Clemetine Marker, MD

## 2020-01-29 LAB — BASIC METABOLIC PANEL
BUN/Creatinine Ratio: 34 — ABNORMAL HIGH (ref 12–28)
BUN: 39 mg/dL — ABNORMAL HIGH (ref 8–27)
CO2: 20 mmol/L (ref 20–29)
Calcium: 10 mg/dL (ref 8.7–10.3)
Chloride: 105 mmol/L (ref 96–106)
Creatinine, Ser: 1.14 mg/dL — ABNORMAL HIGH (ref 0.57–1.00)
GFR calc Af Amer: 58 mL/min/{1.73_m2} — ABNORMAL LOW (ref >59)
GFR calc non Af Amer: 51 mL/min/{1.73_m2} — ABNORMAL LOW (ref >59)
Glucose: 176 mg/dL — ABNORMAL HIGH (ref 65–99)
Potassium: 5.2 mmol/L (ref 3.5–5.2)
Sodium: 140 mmol/L (ref 134–144)

## 2020-01-29 LAB — CBC
Hematocrit: 44 % (ref 34.0–46.6)
Hemoglobin: 14.2 g/dL (ref 11.1–15.9)
MCH: 27.3 pg (ref 26.6–33.0)
MCHC: 32.3 g/dL (ref 31.5–35.7)
MCV: 85 fL (ref 79–97)
Platelets: 238 10*3/uL (ref 150–450)
RBC: 5.2 x10E6/uL (ref 3.77–5.28)
RDW: 14.5 % (ref 11.7–15.4)
WBC: 6.7 10*3/uL (ref 3.4–10.8)

## 2020-01-29 LAB — H. PYLORI BREATH TEST: H pylori Breath Test: NEGATIVE

## 2020-01-30 DIAGNOSIS — Z0000 Encounter for general adult medical examination without abnormal findings: Secondary | ICD-10-CM | POA: Insufficient documentation

## 2020-01-30 DIAGNOSIS — R1013 Epigastric pain: Secondary | ICD-10-CM | POA: Insufficient documentation

## 2020-01-30 HISTORY — DX: Encounter for general adult medical examination without abnormal findings: Z00.00

## 2020-01-30 NOTE — Assessment & Plan Note (Signed)
Recent lipid panel showed ldl not at goal on simvastatin.  Will d/c simvastatin and start rosuvastatin 20mg .  Recheck in 1-2 months.

## 2020-01-30 NOTE — Assessment & Plan Note (Signed)
Not currently taking antacid.  Complaining of gas/bloating/epigastric pain.  Will get h. Pylori breath test.  If negative will discuss gi referral for egd.  Will start pepcid today.

## 2020-01-30 NOTE — Assessment & Plan Note (Signed)
Already has PNA vaccinations, covid,  and shingles vaccine.  Scheduled for colonoscopy.   -dexa scan ordered - flu shot given - cbc, bmp

## 2020-01-30 NOTE — Assessment & Plan Note (Signed)
>>  ASSESSMENT AND PLAN FOR DYSPEPSIA WRITTEN ON 01/30/2020  8:29 AM BY Laneta Pintos, MD  Not currently taking antacid.  Complaining of gas/bloating/epigastric pain.  Will get h. Pylori breath test.  If negative will discuss gi referral for egd.  Will start pepcid today.

## 2020-01-30 NOTE — Assessment & Plan Note (Signed)
Poorly controlled.  On lantus, glipizide, and jardiance.  Endorses compliance but a1c has worsened since starting jardiance.  Discussed with pt dietary habits and limiting carbohydrates.  Also discussed that all diabetic medications will likely result in some level of wweight loss and this is not a bad thing in the context of diabetes.  Pt willing to try once weekly glp1 injection.   - start semaglutide.  If a1c improves with this consider discontinuing the glipizide.

## 2020-02-08 NOTE — Telephone Encounter (Signed)
Received another fax about this.  Will place in your box for reference.Haley Mendez, CMA

## 2020-02-09 ENCOUNTER — Other Ambulatory Visit: Payer: Self-pay | Admitting: Family Medicine

## 2020-02-09 MED ORDER — SEMAGLUTIDE (1 MG/DOSE) 4 MG/3ML ~~LOC~~ SOPN: 0.5000 mg | PEN_INJECTOR | SUBCUTANEOUS | 2 refills | Status: DC

## 2020-02-09 NOTE — Telephone Encounter (Signed)
New Rx sent.

## 2020-02-10 DIAGNOSIS — K5900 Constipation, unspecified: Secondary | ICD-10-CM | POA: Diagnosis not present

## 2020-02-10 DIAGNOSIS — K8020 Calculus of gallbladder without cholecystitis without obstruction: Secondary | ICD-10-CM | POA: Diagnosis not present

## 2020-02-10 DIAGNOSIS — R1013 Epigastric pain: Secondary | ICD-10-CM | POA: Diagnosis not present

## 2020-02-10 DIAGNOSIS — Z1211 Encounter for screening for malignant neoplasm of colon: Secondary | ICD-10-CM | POA: Diagnosis not present

## 2020-02-10 DIAGNOSIS — K219 Gastro-esophageal reflux disease without esophagitis: Secondary | ICD-10-CM | POA: Diagnosis not present

## 2020-02-12 ENCOUNTER — Telehealth: Payer: Self-pay | Admitting: *Deleted

## 2020-02-12 NOTE — Telephone Encounter (Signed)
Received another fax wanting some clarification for pts semaglutide.  Placed in your box for reference.Debra Calabretta Zimmerman Rumple, CMA

## 2020-02-21 ENCOUNTER — Other Ambulatory Visit: Payer: Self-pay | Admitting: Family Medicine

## 2020-02-21 MED ORDER — SEMAGLUTIDE (1 MG/DOSE) 4 MG/3ML ~~LOC~~ SOPN
1.0000 mg | PEN_INJECTOR | SUBCUTANEOUS | 2 refills | Status: DC
Start: 2020-02-21 — End: 2020-08-30

## 2020-02-21 NOTE — Progress Notes (Signed)
[facility] faxed another message stating they cannot do 0.5mg  from a 1mg  dose pack after specifically asking to change it to a 1mg  dose package.  Will change to 1mg  dose per week.

## 2020-03-07 DIAGNOSIS — Z1159 Encounter for screening for other viral diseases: Secondary | ICD-10-CM | POA: Diagnosis not present

## 2020-03-10 DIAGNOSIS — K5730 Diverticulosis of large intestine without perforation or abscess without bleeding: Secondary | ICD-10-CM | POA: Diagnosis not present

## 2020-03-10 DIAGNOSIS — K31A19 Gastric intestinal metaplasia without dysplasia, unspecified site: Secondary | ICD-10-CM | POA: Diagnosis not present

## 2020-03-10 DIAGNOSIS — D123 Benign neoplasm of transverse colon: Secondary | ICD-10-CM | POA: Diagnosis not present

## 2020-03-10 DIAGNOSIS — K2940 Chronic atrophic gastritis without bleeding: Secondary | ICD-10-CM | POA: Diagnosis not present

## 2020-03-10 DIAGNOSIS — K2930 Chronic superficial gastritis without bleeding: Secondary | ICD-10-CM | POA: Diagnosis not present

## 2020-03-10 DIAGNOSIS — Z1211 Encounter for screening for malignant neoplasm of colon: Secondary | ICD-10-CM | POA: Diagnosis not present

## 2020-03-10 DIAGNOSIS — R1013 Epigastric pain: Secondary | ICD-10-CM | POA: Diagnosis not present

## 2020-03-10 DIAGNOSIS — K621 Rectal polyp: Secondary | ICD-10-CM | POA: Diagnosis not present

## 2020-03-16 DIAGNOSIS — K621 Rectal polyp: Secondary | ICD-10-CM | POA: Diagnosis not present

## 2020-03-16 DIAGNOSIS — K31A19 Gastric intestinal metaplasia without dysplasia, unspecified site: Secondary | ICD-10-CM | POA: Diagnosis not present

## 2020-03-16 DIAGNOSIS — D123 Benign neoplasm of transverse colon: Secondary | ICD-10-CM | POA: Diagnosis not present

## 2020-03-16 DIAGNOSIS — K2930 Chronic superficial gastritis without bleeding: Secondary | ICD-10-CM | POA: Diagnosis not present

## 2020-03-31 ENCOUNTER — Other Ambulatory Visit: Payer: Self-pay | Admitting: Family Medicine

## 2020-03-31 DIAGNOSIS — Z794 Long term (current) use of insulin: Secondary | ICD-10-CM

## 2020-03-31 DIAGNOSIS — E1100 Type 2 diabetes mellitus with hyperosmolarity without nonketotic hyperglycemic-hyperosmolar coma (NKHHC): Secondary | ICD-10-CM

## 2020-05-26 ENCOUNTER — Other Ambulatory Visit: Payer: Self-pay | Admitting: Family Medicine

## 2020-05-26 DIAGNOSIS — Z794 Long term (current) use of insulin: Secondary | ICD-10-CM

## 2020-05-26 DIAGNOSIS — E1100 Type 2 diabetes mellitus with hyperosmolarity without nonketotic hyperglycemic-hyperosmolar coma (NKHHC): Secondary | ICD-10-CM

## 2020-05-27 NOTE — Telephone Encounter (Signed)
Can we schedule an appointment for this patient for follow up of diabetes?

## 2020-05-30 NOTE — Telephone Encounter (Signed)
Called patient to schedule appointment - she will call back this week to schedule.

## 2020-06-19 NOTE — Progress Notes (Signed)
    SUBJECTIVE:   CHIEF COMPLAINT / HPI:   DM: Patient states she is compliant with her Lantus, glipizide, Jardiance. States that over the past month she has missed maybe 2 doses of her semaglutide, before that was taking it every week. Her Lantus is 20 units at night. She is okay with stopping the glipizide and rechecking her blood glucose in 3 months.  Dyspepsia: Patient states she had a colonoscopy and EGD [facility] gastroenterology. They did not start her her on any medications. She states she "had a hole" in her stomach.  PERTINENT  PMH / PSH: DM  OBJECTIVE:   BP 116/68   Pulse 62   Ht 5\' 2"  (1.575 m)   Wt 129 lb 3.2 oz (58.6 kg)   SpO2 97%   BMI 23.63 kg/m   Neuro: Alert and oriented. No acute distress. CV: Regular rate and rhythm, no murmurs Pulmonary: Lungs are auscultation bilaterally GI, soft nontender.  ASSESSMENT/PLAN:   Diabetes ([place]) Patient's A1c improved from 9.9-7.7 with the addition of semaglutide. Compliance with semaglutide. Discussed with patient stopping glipizide and reassessing. Will have patient stop taking glipizide and recheck her A1c in 3 months.  Dyspepsia We will get procedure notes from [facility] and follow-up with patient as needed.     Benay Pike, MD [place]

## 2020-06-20 ENCOUNTER — Other Ambulatory Visit: Payer: Self-pay

## 2020-06-20 ENCOUNTER — Encounter: Payer: Self-pay | Admitting: Family Medicine

## 2020-06-20 ENCOUNTER — Ambulatory Visit (INDEPENDENT_AMBULATORY_CARE_PROVIDER_SITE_OTHER): Payer: Medicare Other | Admitting: Family Medicine

## 2020-06-20 VITALS — BP 116/68 | HR 62 | Ht 62.0 in | Wt 129.2 lb

## 2020-06-20 DIAGNOSIS — Z794 Long term (current) use of insulin: Secondary | ICD-10-CM | POA: Diagnosis not present

## 2020-06-20 DIAGNOSIS — E1100 Type 2 diabetes mellitus with hyperosmolarity without nonketotic hyperglycemic-hyperosmolar coma (NKHHC): Secondary | ICD-10-CM | POA: Diagnosis not present

## 2020-06-20 DIAGNOSIS — R1013 Epigastric pain: Secondary | ICD-10-CM | POA: Diagnosis not present

## 2020-06-20 LAB — POCT GLYCOSYLATED HEMOGLOBIN (HGB A1C): Hemoglobin A1C: 7.7 % — AB (ref 4.0–5.6)

## 2020-06-20 NOTE — Patient Instructions (Signed)
It was nice to see you again today,  Congratulations on your A1c being at goal today.  7.7 is much better than 9.9.  We will stop your glipizide today and check you again in 3 months.  I will put in a referral to the podiatrist.  I would like to address your sleep issues again at our next visit.  For now the best thing is to try making the behavior changes discussed in the paper below.  This includes not looking at your phone while you are in bed.  Avoiding on any screen time while in bed.  You can also try things to distract you if you are having trouble falling asleep including: Saying 3 words with each letter of the alphabet going a through Z 2-3 times if necessary.  This will keep your mind off of other things that may be keeping you up at night.  You can take simethicone which is available in over-the-counter Gas-[employer] if you feel like you are having issues with bloating or gas.  Have a great day,  Clemetine Marker, MD    Insomnia Insomnia is a sleep disorder that makes it difficult to fall asleep or stay asleep. Insomnia can cause fatigue, low energy, difficulty concentrating, mood swings, and poor performance at work or school. There are three different ways to classify insomnia:  Difficulty falling asleep.  Difficulty staying asleep.  Waking up too early in the morning. Any type of insomnia can be long-term (chronic) or short-term (acute). Both are common. Short-term insomnia usually lasts for three months or less. Chronic insomnia occurs at least three times a week for longer than three months. What are the causes? Insomnia may be caused by another condition, situation, or substance, such as:  Anxiety.  Certain medicines.  Gastroesophageal reflux disease (GERD) or other gastrointestinal conditions.  Asthma or other breathing conditions.  Restless legs syndrome, sleep apnea, or other sleep disorders.  Chronic pain.  Menopause.  Stroke.  Abuse of alcohol, tobacco, or illegal  drugs.  Mental health conditions, such as depression.  Caffeine.  Neurological disorders, such as Alzheimer's disease.  An overactive thyroid (hyperthyroidism). Sometimes, the cause of insomnia may not be known. What increases the risk? Risk factors for insomnia include:  Gender. Women are affected more often than men.  Age. Insomnia is more common as you get older.  Stress.  Lack of exercise.  Irregular work schedule or working night shifts.  Traveling between different time zones.  Certain medical and mental health conditions. What are the signs or symptoms? If you have insomnia, the main symptom is having trouble falling asleep or having trouble staying asleep. This may lead to other symptoms, such as:  Feeling fatigued or having low energy.  Feeling nervous about going to sleep.  Not feeling rested in the morning.  Having trouble concentrating.  Feeling irritable, anxious, or depressed. How is this diagnosed? This condition may be diagnosed based on:  Your symptoms and medical history. Your health care provider may ask about: ? Your sleep habits. ? Any medical conditions you have. ? Your mental health.  A physical exam. How is this treated? Treatment for insomnia depends on the cause. Treatment may focus on treating an underlying condition that is causing insomnia. Treatment may also include:  Medicines to help you sleep.  Counseling or therapy.  Lifestyle adjustments to help you sleep better. Follow these instructions at home: Eating and drinking  Limit or avoid alcohol, caffeinated beverages, and cigarettes, especially close to bedtime.  These can disrupt your sleep.  Do not eat a large meal or eat spicy foods right before bedtime. This can lead to digestive discomfort that can make it hard for you to sleep.   Sleep habits  Keep a sleep diary to help you and your health care provider figure out what could be causing your insomnia. Write  down: ? When you sleep. ? When you wake up during the night. ? How well you sleep. ? How rested you feel the next day. ? Any side effects of medicines you are taking. ? What you eat and drink.  Make your bedroom a dark, comfortable place where it is easy to fall asleep. ? Put up shades or blackout curtains to block light from outside. ? Use a white noise machine to block noise. ? Keep the temperature cool.  Limit screen use before bedtime. This includes: ? Watching TV. ? Using your smartphone, tablet, or computer.  Stick to a routine that includes going to bed and waking up at the same times every day and night. This can help you fall asleep faster. Consider making a quiet activity, such as reading, part of your nighttime routine.  Try to avoid taking naps during the day so that you sleep better at night.  Get out of bed if you are still awake after 15 minutes of trying to sleep. Keep the lights down, but try reading or doing a quiet activity. When you feel sleepy, go back to bed.   General instructions  Take over-the-counter and prescription medicines only as told by your health care provider.  Exercise regularly, as told by your health care provider. Avoid exercise starting several hours before bedtime.  Use relaxation techniques to manage stress. Ask your health care provider to suggest some techniques that may work well for you. These may include: ? Breathing exercises. ? Routines to release muscle tension. ? Visualizing peaceful scenes.  Make sure that you drive carefully. Avoid driving if you feel very sleepy.  Keep all follow-up visits as told by your health care provider. This is important. Contact a health care provider if:  You are tired throughout the day.  You have trouble in your daily routine due to sleepiness.  You continue to have sleep problems, or your sleep problems get worse. Get help right away if:  You have serious thoughts about hurting yourself or  someone else. If you ever feel like you may hurt yourself or others, or have thoughts about taking your own life, get help right away. You can go to your nearest emergency department or call:  Your local emergency services (911 in the [place]  A suicide crisis helpline, such as the [facility] at 405-384-7919. This is open 24 hours a day. Summary  Insomnia is a sleep disorder that makes it difficult to fall asleep or stay asleep.  Insomnia can be long-term (chronic) or short-term (acute).  Treatment for insomnia depends on the cause. Treatment may focus on treating an underlying condition that is causing insomnia.  Keep a sleep diary to help you and your health care provider figure out what could be causing your insomnia. This information is not intended to replace advice given to you by your health care provider. Make sure you discuss any questions you have with your health care provider. Document Revised: 02/25/2020 Document Reviewed: 02/25/2020 Elsevier Patient Education  2021 [employer].

## 2020-06-22 NOTE — Assessment & Plan Note (Signed)
We will get procedure notes from [facility] and follow-up with patient as needed.

## 2020-06-22 NOTE — Assessment & Plan Note (Signed)
Patient's A1c improved from 9.9-7.7 with the addition of semaglutide. Compliance with semaglutide. Discussed with patient stopping glipizide and reassessing. Will have patient stop taking glipizide and recheck her A1c in 3 months.

## 2020-06-22 NOTE — Assessment & Plan Note (Signed)
>>  ASSESSMENT AND PLAN FOR DYSPEPSIA WRITTEN ON 06/22/2020 10:00 AM BY OLSON, DANIEL K, MD  We will get procedure notes from [facility] and follow-up with patient as needed.

## 2020-08-01 ENCOUNTER — Other Ambulatory Visit: Payer: Self-pay | Admitting: Family Medicine

## 2020-08-29 ENCOUNTER — Other Ambulatory Visit: Payer: Self-pay | Admitting: Family Medicine

## 2020-09-07 ENCOUNTER — Ambulatory Visit: Payer: Medicare Other | Admitting: Family Medicine

## 2020-09-07 NOTE — Progress Notes (Deleted)
    SUBJECTIVE:   CHIEF COMPLAINT / HPI:   DM: stopped glipizide?   HTN: hctz/lisinopril  PERTINENT  PMH / PSH: ***  OBJECTIVE:   There were no vitals taken for this visit.  ***  ASSESSMENT/PLAN:   No problem-specific Assessment & Plan notes found for this encounter.     Benay Pike, MD [place]   {    This will disappear when note is signed, click to select method of visit    :1}

## 2020-09-21 NOTE — Progress Notes (Signed)
    SUBJECTIVE:   CHIEF COMPLAINT / HPI:   DM: Patient stopped taking her glipizide since our last visit as we discussed.  He is taking the rest of her diabetes medications.  She is not currently taking her blood sugar at home because she does not know how to use the meter that she has.    Patient wants to get a pneumonia vaccine.  She also wants to get the COVID booster but only Moderna 1 which we do not have.  Patient does not want to get the shingles vaccine because she had a reaction to the last one.  Patient is still smoking about 10 cigarettes a day.  She wants to quit but has not been able to bring herself to start nicotine replacement therapy.  She does not want to try Chantix.  Does not want to talk to the smoking cessation hotline.     PERTINENT  PMH / PSH:   OBJECTIVE:   BP 94/70   Pulse 86   Ht 5\' 3"  (1.6 m)   Wt 124 lb 3.2 oz (56.3 kg)   SpO2 98%   BMI 22.00 kg/m   General: Alert and oriented.  No acute distress. CV: Regular rate and rhythm, no murmurs Pulmonary: Lungs clear to auscultation bilaterally, no wheeze or crackles.   ASSESSMENT/PLAN:   Diabetes ([place]) Went from 7.7-8.0 after stopping the glipizide.  We will send in a new blood sugar meter, she will return for education on how to use it, and then we will titrate up her Lantus for better blood sugar control.  Goal is around 7.5 A1c.  Patient does not want to increase Ozempic due to the associated weight loss which is a concern for her.  Healthcare maintenance Received Pneumovax 23 today.  Wanted to get a Moderna booster, which we do not have.  Advised her to seek out a pharmacy that has it.  Patient does not want shingles vaccine.  She had a reaction to the first 1.  Colonoscopy is up-to-date.  We will need to update her health maintenance to reflect that.  Getting records from [place] as well.  TOBACCO ABUSE Patient still smoking 10 cigarettes a day.  She wants to quit but has not been able to bring  herself to start yet.  She declined to be 1 800 quit now 3 nicotine replacement.  She declined Chantix.  Advised her that we can always help her when she is ready.  Encouraged her to quit.     Benay Pike, MD [place]

## 2020-09-22 ENCOUNTER — Ambulatory Visit (INDEPENDENT_AMBULATORY_CARE_PROVIDER_SITE_OTHER): Payer: Medicare Other | Admitting: Family Medicine

## 2020-09-22 ENCOUNTER — Other Ambulatory Visit: Payer: Self-pay

## 2020-09-22 ENCOUNTER — Encounter: Payer: Self-pay | Admitting: Family Medicine

## 2020-09-22 VITALS — BP 94/70 | HR 86 | Ht 63.0 in | Wt 124.2 lb

## 2020-09-22 DIAGNOSIS — Z794 Long term (current) use of insulin: Secondary | ICD-10-CM

## 2020-09-22 DIAGNOSIS — Z0000 Encounter for general adult medical examination without abnormal findings: Secondary | ICD-10-CM

## 2020-09-22 DIAGNOSIS — E7800 Pure hypercholesterolemia, unspecified: Secondary | ICD-10-CM | POA: Diagnosis not present

## 2020-09-22 DIAGNOSIS — F17200 Nicotine dependence, unspecified, uncomplicated: Secondary | ICD-10-CM

## 2020-09-22 DIAGNOSIS — E1100 Type 2 diabetes mellitus with hyperosmolarity without nonketotic hyperglycemic-hyperosmolar coma (NKHHC): Secondary | ICD-10-CM | POA: Diagnosis not present

## 2020-09-22 DIAGNOSIS — Z23 Encounter for immunization: Secondary | ICD-10-CM | POA: Diagnosis not present

## 2020-09-22 LAB — POCT GLYCOSYLATED HEMOGLOBIN (HGB A1C): HbA1c, POC (controlled diabetic range): 8 % — AB (ref 0.0–7.0)

## 2020-09-22 NOTE — Patient Instructions (Signed)
It was nice to see you today,  We did the following things today: - I will put in an order for a new blood sugar meter.  When you receive it you can schedule appointment to meet with [place] so that we can show you how to use it. - We have given you the pneumonia vaccine - We do not have the Moderna vaccine so you will have to go to a pharmacy that has one. - We checked your cholesterol and kidney function.  I will call you with results when I get them. - We will get the records from [facility]. - Otherwise it appears you are up-to-date on all of your vaccinations.  I would recommend getting a second dose of the shingles vaccine in the future.  Have a great day,  Clemetine Marker, MD

## 2020-09-22 NOTE — Assessment & Plan Note (Signed)
Went from 7.7-8.0 after stopping the glipizide.  We will send in a new blood sugar meter, she will return for education on how to use it, and then we will titrate up her Lantus for better blood sugar control.  Goal is around 7.5 A1c.  Patient does not want to increase Ozempic due to the associated weight loss which is a concern for her.

## 2020-09-22 NOTE — Assessment & Plan Note (Signed)
Patient still smoking 10 cigarettes a day.  She wants to quit but has not been able to bring herself to start yet.  She declined to be 1 800 quit now 3 nicotine replacement.  She declined Chantix.  Advised her that we can always help her when she is ready.  Encouraged her to quit.

## 2020-09-22 NOTE — Assessment & Plan Note (Signed)
Received Pneumovax 23 today.  Wanted to get a Moderna booster, which we do not have.  Advised her to seek out a pharmacy that has it.  Patient does not want shingles vaccine.  She had a reaction to the first 1.  Colonoscopy is up-to-date.  We will need to update her health maintenance to reflect that.  Getting records from [place] as well.

## 2020-09-23 LAB — BASIC METABOLIC PANEL WITH GFR
BUN/Creatinine Ratio: 14 (ref 12–28)
BUN: 15 mg/dL (ref 8–27)
CO2: 24 mmol/L (ref 20–29)
Calcium: 10.1 mg/dL (ref 8.7–10.3)
Chloride: 102 mmol/L (ref 96–106)
Creatinine, Ser: 1.08 mg/dL — ABNORMAL HIGH (ref 0.57–1.00)
Glucose: 120 mg/dL — ABNORMAL HIGH (ref 65–99)
Potassium: 4.2 mmol/L (ref 3.5–5.2)
Sodium: 143 mmol/L (ref 134–144)
eGFR: 57 mL/min/1.73 — ABNORMAL LOW

## 2020-09-23 LAB — LIPID PANEL
Chol/HDL Ratio: 4.7 ratio — ABNORMAL HIGH (ref 0.0–4.4)
Cholesterol, Total: 192 mg/dL (ref 100–199)
HDL: 41 mg/dL (ref >39)
LDL Chol Calc (NIH): 135 mg/dL — ABNORMAL HIGH (ref 0–99)
Triglycerides: 89 mg/dL (ref 0–149)
VLDL Cholesterol Cal: 16 mg/dL (ref 5–40)

## 2020-09-30 ENCOUNTER — Other Ambulatory Visit: Payer: Self-pay | Admitting: Gastroenterology

## 2020-09-30 DIAGNOSIS — R1084 Generalized abdominal pain: Secondary | ICD-10-CM

## 2020-09-30 DIAGNOSIS — K5904 Chronic idiopathic constipation: Secondary | ICD-10-CM | POA: Diagnosis not present

## 2020-09-30 DIAGNOSIS — K31A0 Gastric intestinal metaplasia, unspecified: Secondary | ICD-10-CM | POA: Diagnosis not present

## 2020-10-04 ENCOUNTER — Other Ambulatory Visit: Payer: Self-pay | Admitting: Family Medicine

## 2020-10-04 ENCOUNTER — Telehealth: Payer: Self-pay

## 2020-10-04 DIAGNOSIS — E1100 Type 2 diabetes mellitus with hyperosmolarity without nonketotic hyperglycemic-hyperosmolar coma (NKHHC): Secondary | ICD-10-CM

## 2020-10-04 DIAGNOSIS — I10 Essential (primary) hypertension: Secondary | ICD-10-CM

## 2020-10-04 NOTE — Telephone Encounter (Signed)
Patient calls nurse line requesting returned phone call from provider to discuss results from visit on 5/26.  Talbot Grumbling, RN

## 2020-10-05 ENCOUNTER — Other Ambulatory Visit: Payer: Self-pay | Admitting: Family Medicine

## 2020-10-05 MED ORDER — ROSUVASTATIN CALCIUM 40 MG PO TABS: 40.0000 mg | ORAL_TABLET | Freq: Every day | ORAL | 1 refills | Status: DC

## 2020-10-18 ENCOUNTER — Other Ambulatory Visit: Payer: Medicare Other

## 2020-10-24 ENCOUNTER — Other Ambulatory Visit: Payer: Self-pay | Admitting: Family Medicine

## 2020-11-02 ENCOUNTER — Ambulatory Visit
Admission: RE | Admit: 2020-11-02 | Discharge: 2020-11-02 | Disposition: A | Discharge status: Home / Self Care | Payer: Medicare Other | Source: Ambulatory Visit | Attending: Gastroenterology | Admitting: Gastroenterology

## 2020-11-02 ENCOUNTER — Other Ambulatory Visit: Payer: Self-pay

## 2020-11-02 DIAGNOSIS — R109 Unspecified abdominal pain: Secondary | ICD-10-CM | POA: Diagnosis not present

## 2020-11-02 DIAGNOSIS — K8020 Calculus of gallbladder without cholecystitis without obstruction: Secondary | ICD-10-CM | POA: Diagnosis not present

## 2020-11-02 DIAGNOSIS — K5730 Diverticulosis of large intestine without perforation or abscess without bleeding: Secondary | ICD-10-CM | POA: Diagnosis not present

## 2020-11-02 DIAGNOSIS — R1084 Generalized abdominal pain: Secondary | ICD-10-CM

## 2020-11-08 ENCOUNTER — Other Ambulatory Visit: Payer: Self-pay | Admitting: Internal Medicine

## 2020-11-08 ENCOUNTER — Other Ambulatory Visit: Payer: Self-pay | Admitting: Family Medicine

## 2020-11-08 DIAGNOSIS — Z1231 Encounter for screening mammogram for malignant neoplasm of breast: Secondary | ICD-10-CM

## 2020-12-15 DIAGNOSIS — Z13810 Encounter for screening for upper gastrointestinal disorder: Secondary | ICD-10-CM | POA: Diagnosis not present

## 2020-12-15 DIAGNOSIS — K2930 Chronic superficial gastritis without bleeding: Secondary | ICD-10-CM | POA: Diagnosis not present

## 2020-12-15 DIAGNOSIS — K31A0 Gastric intestinal metaplasia, unspecified: Secondary | ICD-10-CM | POA: Diagnosis not present

## 2020-12-21 DIAGNOSIS — K2930 Chronic superficial gastritis without bleeding: Secondary | ICD-10-CM | POA: Diagnosis not present

## 2021-01-03 ENCOUNTER — Ambulatory Visit: Payer: Medicare Other

## 2021-01-12 ENCOUNTER — Other Ambulatory Visit: Payer: Self-pay | Admitting: Family Medicine

## 2021-01-12 DIAGNOSIS — E1100 Type 2 diabetes mellitus with hyperosmolarity without nonketotic hyperglycemic-hyperosmolar coma (NKHHC): Secondary | ICD-10-CM

## 2021-01-26 ENCOUNTER — Other Ambulatory Visit: Payer: Self-pay

## 2021-01-26 DIAGNOSIS — E1100 Type 2 diabetes mellitus with hyperosmolarity without nonketotic hyperglycemic-hyperosmolar coma (NKHHC): Secondary | ICD-10-CM

## 2021-01-27 MED ORDER — LANTUS SOLOSTAR 100 UNIT/ML ~~LOC~~ SOPN: 20.0000 [IU] | PEN_INJECTOR | Freq: Every day | SUBCUTANEOUS | 3 refills | Status: DC

## 2021-02-16 ENCOUNTER — Other Ambulatory Visit: Payer: Self-pay | Admitting: Family Medicine

## 2021-02-22 ENCOUNTER — Encounter: Payer: Self-pay | Admitting: Family Medicine

## 2021-02-22 ENCOUNTER — Ambulatory Visit (INDEPENDENT_AMBULATORY_CARE_PROVIDER_SITE_OTHER): Payer: Medicare Other | Admitting: Family Medicine

## 2021-02-22 ENCOUNTER — Other Ambulatory Visit: Payer: Self-pay

## 2021-02-22 ENCOUNTER — Ambulatory Visit: Payer: Medicare Other | Admitting: Family Medicine

## 2021-02-22 VITALS — BP 124/68 | HR 63 | Ht 63.0 in | Wt 123.0 lb

## 2021-02-22 DIAGNOSIS — E1100 Type 2 diabetes mellitus with hyperosmolarity without nonketotic hyperglycemic-hyperosmolar coma (NKHHC): Secondary | ICD-10-CM

## 2021-02-22 DIAGNOSIS — Z794 Long term (current) use of insulin: Secondary | ICD-10-CM

## 2021-02-22 DIAGNOSIS — I10 Essential (primary) hypertension: Secondary | ICD-10-CM

## 2021-02-22 DIAGNOSIS — Z23 Encounter for immunization: Secondary | ICD-10-CM | POA: Diagnosis not present

## 2021-02-22 LAB — POCT GLYCOSYLATED HEMOGLOBIN (HGB A1C): HbA1c, POC (controlled diabetic range): 7.8 % — AB (ref 0.0–7.0)

## 2021-02-22 MED ORDER — EMPAGLIFLOZIN 25 MG PO TABS: 25.0000 mg | ORAL_TABLET | Freq: Every day | ORAL | 3 refills | Status: DC

## 2021-02-22 MED ORDER — OZEMPIC (1 MG/DOSE) 4 MG/3ML ~~LOC~~ SOPN: PEN_INJECTOR | SUBCUTANEOUS | 0 refills | Status: DC

## 2021-02-22 MED ORDER — LANTUS SOLOSTAR 100 UNIT/ML ~~LOC~~ SOPN: 20.0000 [IU] | PEN_INJECTOR | Freq: Every day | SUBCUTANEOUS | 3 refills | Status: DC

## 2021-02-22 MED ORDER — LISINOPRIL-HYDROCHLOROTHIAZIDE 20-25 MG PO TABS: ORAL_TABLET | ORAL | 0 refills | Status: DC

## 2021-02-22 MED ORDER — BLOOD GLUCOSE METER KIT: PACK | 0 refills | Status: AC

## 2021-02-22 NOTE — Progress Notes (Signed)
    SUBJECTIVE:   CHIEF COMPLAINT / HPI:   Diabetes f/u Patient is unsure of how her blood sugars have been running.  She reports that she noticed her medication regimen and is supposed be taking Lantus 20 units daily as well as Ozempic.  Patient is also on Jardiance 25 mg daily.  She reports she takes this regularly.  She reports that she only takes the Ozempic maybe once a month because she wakes up in the morning and she feels dizzy.  She has not been able to take her blood sugars because she does not have a meter that works.  Reports that she takes her Lantus 20 units maybe 4 or 5 times a week.  Reports that after she eats her dizzy symptoms go away.  Patient's previous hemoglobin A1c was 8.0.   PERTINENT  PMH / PSH: Type 2 diabetes  OBJECTIVE:   BP 124/68   Pulse 63   Ht 5\' 3"  (1.6 m)   Wt 123 lb (55.8 kg)   SpO2 99%   BMI 21.79 kg/m   General: Well-appearing 65 year old female in no acute distress Cardiac: Regular rate and rhythm, no murmurs appreciated Respiratory: Normal work of breathing Abdomen: Soft, nontender, positive bowel sounds MSK: No gross abnormalities, diabetic foot exam completed today  Diabetic Foot Exam - Simple   Simple Foot Form Visual Inspection No deformities, no ulcerations, no other skin breakdown bilaterally: Yes Sensation Testing Intact to touch and monofilament testing bilaterally: Yes Pulse Check Posterior Tibialis and Dorsalis pulse intact bilaterally: Yes Comments      ASSESSMENT/PLAN:   Diabetes ([facility]) Hemoglobin A1c today 7.8 from 8.0 at previous check.  Prescription sent for new blood glucose monitor so she can monitor her blood sugars.  She is almost at goal at this time and I feel she may be able to decrease/discontinue her Lantus if she were taking Ozempic regularly.  Concerned regarding her possible hypoglycemic episodes.  Discussed hypoglycemic symptoms and recommended she evaluate her blood sugars if low.  She is going to notify us  and we are going to decrease her Lantus if needed.  Scheduled for follow-up with pharmacy team.  Completed diabetic foot exam today.  Strict ED and return precautions given.     Gifford Shave, MD [place]

## 2021-02-22 NOTE — Patient Instructions (Signed)
It was wonderful seeing you today.  Your hemoglobin A1c was 7.8 today down from 8.0 to her previous check.  I want you to continue your current medicines and I have sent a meter to check your blood sugars at least 3 times a day.  If you are having any low blood sugars in the morning I want you to call our clinic and let us know so that we can make adjustments to your medications.  I have scheduled you an appointment with our pharmacy team in 1 month and they can go over your blood sugars and make any medication changes needed.  I want you to follow-up with me in 3 months to repeat your hemoglobin A1c and we can make further adjustments then.  If you have any questions or concerns call the clinic.  I hope you have a wonderful afternoon!

## 2021-02-23 NOTE — Assessment & Plan Note (Signed)
Hemoglobin A1c today 7.8 from 8.0 at previous check.  Prescription sent for new blood glucose monitor so she can monitor her blood sugars.  She is almost at goal at this time and I feel she may be able to decrease/discontinue her Lantus if she were taking Ozempic regularly.  Concerned regarding her possible hypoglycemic episodes.  Discussed hypoglycemic symptoms and recommended she evaluate her blood sugars if low.  She is going to notify us and we are going to decrease her Lantus if needed.  Scheduled for follow-up with pharmacy team.  Completed diabetic foot exam today.  Strict ED and return precautions given.

## 2021-02-24 ENCOUNTER — Telehealth: Payer: Self-pay

## 2021-02-24 MED ORDER — ACCU-CHEK GUIDE VI STRP: ORAL_STRIP | 3 refills | Status: DC

## 2021-02-24 NOTE — Telephone Encounter (Signed)
Patient calls nurse line reporting a new meter was sent in, however no test strips.  Test strips sent to the pharmacy to match meter.

## 2021-03-22 ENCOUNTER — Ambulatory Visit: Payer: Medicare Other

## 2021-03-27 ENCOUNTER — Ambulatory Visit: Payer: Medicare Other | Admitting: Pharmacist

## 2021-04-06 ENCOUNTER — Other Ambulatory Visit: Payer: Self-pay | Admitting: Family Medicine

## 2021-04-24 ENCOUNTER — Other Ambulatory Visit: Payer: Self-pay | Admitting: Family Medicine

## 2021-04-24 DIAGNOSIS — I10 Essential (primary) hypertension: Secondary | ICD-10-CM

## 2021-06-17 ENCOUNTER — Other Ambulatory Visit: Payer: Self-pay | Admitting: Family Medicine

## 2021-07-05 DIAGNOSIS — K219 Gastro-esophageal reflux disease without esophagitis: Secondary | ICD-10-CM | POA: Diagnosis not present

## 2021-07-05 DIAGNOSIS — R634 Abnormal weight loss: Secondary | ICD-10-CM | POA: Diagnosis not present

## 2021-07-31 ENCOUNTER — Other Ambulatory Visit: Payer: Self-pay | Admitting: Family Medicine

## 2021-08-01 ENCOUNTER — Ambulatory Visit: Payer: Medicare Other | Admitting: Family Medicine

## 2021-08-10 ENCOUNTER — Ambulatory Visit (INDEPENDENT_AMBULATORY_CARE_PROVIDER_SITE_OTHER): Payer: Medicare Other | Admitting: Family Medicine

## 2021-08-10 ENCOUNTER — Encounter: Payer: Self-pay | Admitting: Family Medicine

## 2021-08-10 VITALS — BP 136/70 | HR 66 | Ht 63.0 in | Wt 126.2 lb

## 2021-08-10 DIAGNOSIS — I10 Essential (primary) hypertension: Secondary | ICD-10-CM | POA: Diagnosis not present

## 2021-08-10 DIAGNOSIS — E1100 Type 2 diabetes mellitus with hyperosmolarity without nonketotic hyperglycemic-hyperosmolar coma (NKHHC): Secondary | ICD-10-CM

## 2021-08-10 DIAGNOSIS — Z794 Long term (current) use of insulin: Secondary | ICD-10-CM

## 2021-08-10 LAB — POCT GLYCOSYLATED HEMOGLOBIN (HGB A1C): HbA1c, POC (controlled diabetic range): 7.9 % — AB (ref 0.0–7.0)

## 2021-08-10 MED ORDER — ROSUVASTATIN CALCIUM 40 MG PO TABS: 40.0000 mg | ORAL_TABLET | Freq: Every day | ORAL | 0 refills | Status: DC

## 2021-08-10 MED ORDER — METFORMIN HCL ER 500 MG PO TB24: 500.0000 mg | ORAL_TABLET | Freq: Two times a day (BID) | ORAL | 3 refills | Status: DC

## 2021-08-10 MED ORDER — LOSARTAN POTASSIUM 25 MG PO TABS: 25.0000 mg | ORAL_TABLET | Freq: Every day | ORAL | 3 refills | Status: DC

## 2021-08-10 MED ORDER — EMPAGLIFLOZIN 25 MG PO TABS: 25.0000 mg | ORAL_TABLET | Freq: Every day | ORAL | 3 refills | Status: DC

## 2021-08-10 NOTE — Patient Instructions (Signed)
It was great seeing you today.  Regarding your diabetes your hemoglobin A1c was 7.8 which was essentially stable from the previous check.  That is above your goal so I want to start you on metformin 500 mg twice daily.  I sent a prescription for this to your pharmacy.  Please continue your current insulin regimen.  I would like for you to follow-up with our pharmacy team in 1 month to go over your blood glucoses and make any adjustments needed.  Regarding your smoking please let us know if we can do anything to help you with quitting smoking.  Regarding your blood pressure I have discontinued the combination blood pressure pill that you are on and started you on the lowest dose of medication called losartan which will help provide kidney protection.  I also sent refills for your Crestor and Jardiance.  I am going to collect some lab work today to check your kidney function.  If you have any questions or concerns please call the clinic.  I hope you have a great day! ?

## 2021-08-10 NOTE — Assessment & Plan Note (Signed)
Hemoglobin A1c of 7.9 today.  Previous hemoglobin A1c 7.8.  Has not been taking Ozempic regularly because of weight loss.  Has been taking Jardiance as prescribed.  Refilled Jardiance as well as started patient on metformin 500 mg twice daily.  Discontinued Ozempic.  Patient spoke with pharmacy team today and got instructed on how to use CGM.  We will follow-up in 1 month with pharmacy team to make alterations to patient's diabetic regimen. ?

## 2021-08-10 NOTE — Assessment & Plan Note (Signed)
Patient complaining of hypotensive events on HCTZ/lisinopril combo.  Discontinue this medication.  Started patient on losartan 25 mg daily. ?- Follow-up with pharmacy in 1 month to evaluate blood pressure control ?- BMP ?

## 2021-08-10 NOTE — Progress Notes (Signed)
? ? ?  SUBJECTIVE:  ? ?CHIEF COMPLAINT / HPI:  ? ?Diabetes follow-up ?Patient presents today for diabetes follow-up.  She has a continuous glucose monitor but is unaware of how to use it so is hoping for some guidance in this.  Has not been checking her sugars regularly but feels she is doing okay and has not had any hypoglycemic events.  Previous hemoglobin A1c was 7.8.  She was prescribed Ozempic but stopped taking this medication due to weight loss.  She reports that she does not want to lose any weight.  Was previously on metformin at 1000 mg twice daily but this was discontinued because of her weight loss. ? ?Hypertension ?Patient on combination lisinopril/hydrochlorothiazide tablet to help with hypertension.  She reports hypotensive episodes so she self discontinued this medication.  Denies any signs or symptoms of hypertension. ? ?OBJECTIVE:  ? ?BP 136/70   Pulse 66   Ht '5\' 3"'$  (1.6 m)   Wt 126 lb 3.2 oz (57.2 kg)   SpO2 99%   BMI 22.36 kg/m?   ?General: Pleasant, well-appearing 66 year old female in no acute distress ?Cardiac: Regular rate and rhythm, no murmurs appreciated ?Respiratory: Normal work of breathing, lungs clear to auscultation, speaking in full sentences ?Abdomen: Soft, nontender ?MSK: No gross abnormalities ? ? ?ASSESSMENT/PLAN:  ? ?Hypertension ?Patient complaining of hypotensive events on HCTZ/lisinopril combo.  Discontinue this medication.  Started patient on losartan 25 mg daily. ?- Follow-up with pharmacy in 1 month to evaluate blood pressure control ?- BMP ? ?Diabetes ([place]) ?Hemoglobin A1c of 7.9 today.  Previous hemoglobin A1c 7.8.  Has not been taking Ozempic regularly because of weight loss.  Has been taking Jardiance as prescribed.  Refilled Jardiance as well as started patient on metformin 500 mg twice daily.  Discontinued Ozempic.  Patient spoke with pharmacy team today and got instructed on how to use CGM.  We will follow-up in 1 month with pharmacy team to make alterations to  patient's diabetic regimen. ?  ? ? ?Gifford Shave, MD ?[place]  ? ?

## 2021-08-11 LAB — BASIC METABOLIC PANEL
BUN/Creatinine Ratio: 16 (ref 12–28)
BUN: 14 mg/dL (ref 8–27)
CO2: 21 mmol/L (ref 20–29)
Calcium: 9.5 mg/dL (ref 8.7–10.3)
Chloride: 108 mmol/L — ABNORMAL HIGH (ref 96–106)
Creatinine, Ser: 0.89 mg/dL (ref 0.57–1.00)
Glucose: 83 mg/dL (ref 70–99)
Potassium: 4.7 mmol/L (ref 3.5–5.2)
Sodium: 145 mmol/L — ABNORMAL HIGH (ref 134–144)
eGFR: 71 mL/min/{1.73_m2} (ref >59)

## 2021-09-14 ENCOUNTER — Ambulatory Visit: Payer: Medicare Other | Admitting: Pharmacist

## 2021-10-26 ENCOUNTER — Ambulatory Visit
Admission: RE | Admit: 2021-10-26 | Discharge: 2021-10-26 | Disposition: A | Discharge status: Home / Self Care | Payer: Medicare Other | Source: Ambulatory Visit | Attending: Internal Medicine | Admitting: Internal Medicine

## 2021-10-26 DIAGNOSIS — Z1231 Encounter for screening mammogram for malignant neoplasm of breast: Secondary | ICD-10-CM | POA: Diagnosis not present

## 2021-10-30 ENCOUNTER — Other Ambulatory Visit: Payer: Self-pay | Admitting: *Deleted

## 2021-10-30 MED ORDER — ROSUVASTATIN CALCIUM 40 MG PO TABS: 40.0000 mg | ORAL_TABLET | Freq: Every day | ORAL | 0 refills | Status: DC

## 2021-11-28 ENCOUNTER — Encounter (INDEPENDENT_AMBULATORY_CARE_PROVIDER_SITE_OTHER): Payer: Self-pay | Admitting: Ophthalmology

## 2021-11-30 NOTE — Progress Notes (Signed)
[facility] Note  12/06/2021     CHIEF COMPLAINT Patient presents for Retina Follow Up   HISTORY OF PRESENT ILLNESS: Haley Mendez is a 66 y.o. female who presents to the clinic today for:   HPI     Retina Follow Up   Patient presents with  Other.  In both eyes.  This started years ago.  Severity is mild.  Duration of 4 years.  Since onset it is stable.  I, the attending physician,  performed the HPI with the patient and updated documentation appropriately.        Comments   Patient feels that the vision is the same since her last appointment four years ago. She denies seeing flashes of light or floaters. She is complaining of dry eyes when looking at a computer screen or phone for lone periods of time. She doesn't check her sugars, her A1C is 7.8.      Last edited by Bernarda Caffey, MD on 12/08/2021  1:12 AM.    Pt has been lost to follow up since 2019, her last A1c was 7.9 in April, she states she is on metformin, lantus and jardiance, she states she is not having any problems, just wanted an eye exam since she is diabetic  Referring physician: Jacelyn Grip, MD [place],  [place] [address]  HISTORICAL INFORMATION:   Selected notes from the [facility] Referred by A. Lundquist, PA for concern of supeiror lattice degeneration OU and retinal hole OU LEE: 08.13.19 (A. Lundquist) [BCVA OD: 20/20 OS: 20/20] Ocular Hx-melanosis, cataract OU, DES OU PMH-DM (taking Metformin, Jardiance, glipizide, last A1C - 7.6), HTN, high cholesterol, arthritis    CURRENT MEDICATIONS: No current outpatient medications on file. (Ophthalmic Drugs)   No current facility-administered medications for this visit. (Ophthalmic Drugs)   Current Outpatient Medications (Other)  Medication Sig   blood glucose meter kit and supplies Dispense based on patient and insurance preference. Use up to four times daily as directed. (FOR ICD-10 E10.9, E11.9).    empagliflozin (JARDIANCE) 25 MG TABS tablet Take 1 tablet (25 mg total) by mouth daily.   famotidine (PEPCID) 20 MG tablet Take 2 tabs daily for two weeks, then take 1 tab daily for one week.  Then take as needed.   glipiZIDE (GLUCOTROL XL) 10 MG 24 hr tablet TAKE 2 TABLETS BY MOUTH ONCE DAILY WITH BREAKFAST   glucose blood (ACCU-CHEK GUIDE) test strip Use to test blood sugars 3x per day E11.9   insulin glargine (LANTUS SOLOSTAR) 100 UNIT/ML Solostar Pen Inject 20 Units into the skin at bedtime.   Insulin Pen Needle (TRUEPLUS PEN NEEDLES) 32G X 4 MM MISC Use as directed to administer insulin daily   losartan (COZAAR) 25 MG tablet Take 1 tablet (25 mg total) by mouth at bedtime.   metFORMIN (GLUCOPHAGE-XR) 500 MG 24 hr tablet Take 1 tablet (500 mg total) by mouth in the morning and at bedtime.   rosuvastatin (CRESTOR) 40 MG tablet Take 1 tablet (40 mg total) by mouth daily.   TRUEplus Lancets 28G MISC 1 each by Does not apply route 3 (three) times daily before meals.   No current facility-administered medications for this visit. (Other)   REVIEW OF SYSTEMS: ROS   Positive for: Endocrine, Eyes Negative for: Constitutional, Gastrointestinal, Neurological, Skin, Genitourinary, Musculoskeletal, HENT, Cardiovascular, Respiratory, Psychiatric, Allergic/Imm, Heme/Lymph Last edited by Annie Paras, COT on 12/06/2021  1:18 PM.     ALLERGIES Allergies  Allergen Reactions   Acidophilus    PAST MEDICAL HISTORY Past Medical History:  Diagnosis Date   Arthritis    Bone spur of other site    left rotator cuff   Diabetes mellitus without complication ([place])    History of colon polyps    Hyperlipidemia    Hypertension    History reviewed. No pertinent surgical history.  FAMILY HISTORY Family History  Problem Relation Age of Onset   Early death Mother    Diabetes Father    Hypertension Father    Diabetes Brother    Diabetes Brother    Amblyopia Neg Hx    Blindness Neg Hx     Cataracts Neg Hx    Glaucoma Neg Hx    Macular degeneration Neg Hx    Retinal detachment Neg Hx    Strabismus Neg Hx    Retinitis pigmentosa Neg Hx    SOCIAL HISTORY Social History   Tobacco Use   Smoking status: Every Day    Packs/day: 0.50    Years: 15.00    Total pack years: 7.50    Types: Cigarettes   Smokeless tobacco: Never  Vaping Use   Vaping Use: Never used  Substance Use Topics   Alcohol use: Yes    Alcohol/week: 2.0 standard drinks of alcohol    Types: 2 Cans of beer per week    Comment: occasional   Drug use: No       OPHTHALMIC EXAM:   Base Eye Exam     Visual Acuity (Snellen - Linear)       Right Left   Dist [place] 20/20 -1 20/20 +1         Tonometry (Tonopen, 1:21 PM)       Right Left   Pressure 17 18         Pupils       Dark Light Shape React APD   Right 4 3 Round Brisk None   Left 4 3 Round Brisk None         Visual Fields       Left Right    Full Full         Extraocular Movement       Right Left    Full, Ortho Full, Ortho         Neuro/Psych     Oriented x3: Yes   Mood/Affect: Normal         Dilation     Both eyes: 2.5% Phenylephrine, 1.0% Mydriacyl @ 1:19 PM           Slit Lamp and Fundus Exam     Slit Lamp Exam       Right Left   Lids/Lashes Dermatochalasis - upper lid Dermatochalasis - upper lid, Meibomian gland dysfunction   Conjunctiva/Sclera Melanosis Melanosis   Cornea Arcus, trace PEE Arcus, 2+ inferior Punctate epithelial erosions   Anterior Chamber deep, clear, narrow temporal angle deep, clear, narrow temporal angle   Iris Round and moderately dilated to 66m Round and moderately dilated to 619m  Lens 2-3+ Nuclear sclerosis, 2-3+ Cortical cataract 2-3+ Nuclear sclerosis, 2-3+ Cortical cataract   Anterior Vitreous mild syneresis Vitreous syneresis         Fundus Exam       Right Left   Disc Pink and Sharp, mild PPP Pink and Sharp, PPP   C/D Ratio 0.4 0.4   Macula Flat, good foveal  reflex, mild retinal pigment epithelial mottling, No heme or edema Flat,  good foveal reflex, Retinal pigment epithelial mottling, No heme or edema   Vessels attenuated, copper wiring, mild AV crossing changes attenuated, mild copper wiring, mild AV crossing changes   Periphery Attached, pigmented Lattice degeneration from 1100 to 1200 with atrophic holes; pigmented lattice with atrophic hole and overlying vitreous syneresis at 0730 and 1030 -- good laser surrounding all lesions; no heme, no new RT/RD/lattice Attached, pigmented Lattice degeneration at 1200, 0600, 0730, and 1030 equator -- good laser changes surrounding all lesions, No heme; no new RT/RD or lattice           IMAGING AND PROCEDURES  Imaging and Procedures for @TODAY @  OCT, Retina - OU - Both Eyes       Right Eye Quality was good. Central Foveal Thickness: 301. Progression has been stable. Findings include no IRF, no SRF, abnormal foveal contour, vitreous traction, vitreomacular adhesion (Mild central VMT).   Left Eye Quality was good. Central Foveal Thickness: 258. Progression has been stable. Findings include normal foveal contour, no IRF, no SRF, vitreomacular adhesion (Partial PVD).   Notes *Images captured and stored on drive  Diagnosis / Impression:  NFP, No IRF/SRF OU +VMA OU No DME  Clinical management:  See below  Abbreviations: NFP - Normal foveal profile. CME - cystoid macular edema. PED - pigment epithelial detachment. IRF - intraretinal fluid. SRF - subretinal fluid. EZ - ellipsoid zone. ERM - epiretinal membrane. ORA - outer retinal atrophy. ORT - outer retinal tubulation. SRHM - subretinal hyper-reflective material              ASSESSMENT/PLAN:    ICD-10-CM   1. Bilateral retinal lattice degeneration  H35.413     2. Retinal hole of both eyes  H33.323     3. Diabetes mellitus type 2 without retinopathy ([place])  E11.9 OCT, Retina - OU - Both Eyes    4. Essential hypertension  I10     5.  Hypertensive retinopathy of both eyes  H35.033     6. Combined forms of age-related cataract of both eyes  H25.813       1,2. Lattice degeneration w/ atrophic holes, OU - OD: pigmented patches from 1100 to 1200 with atrophic holes, patch at 0730, and 1030 - OS: pigmented lattice at 1200, 0600, 0730, 1030 equator; round atrophic hole at 0100 - s/p laser retinopexy OD (08.15.19), S/P touch up laser retinopexy OD (08.29.19) -- good laser changes around all lesions OD - s/p laser retinopexy OS (09.12.19) -- good laser changes in place - no new RT/RD or lattice OU - f/u 1 year, DFE, OCT  3.Diabetes mellitus, type 2 without retinopathy - The incidence, risk factors for progression, natural history and treatment options for diabetic retinopathy  were discussed with patient.   - The need for close monitoring of blood glucose, blood pressure, and serum lipids, avoiding cigarette or any type of tobacco, and the need for long term follow up was also discussed with patient. - f/u in 1 year, sooner prn  4,5. Hypertensive retinopathy OU - discussed importance of tight BP control - monitor  6. Combined form age related cataract OU-  - The symptoms of cataract, surgical options, and treatments and risks were discussed with patient. - discussed diagnosis and progression - monitor  Ophthalmic Meds Ordered this visit:  No orders of the defined types were placed in this encounter.    Return in about 1 year (around 12/07/2022) for f/u DM exam, DFE, OCT.  There are no Patient Instructions on  file for this visit.   Explained the diagnoses, plan, and follow up with the patient and they expressed understanding.  Patient expressed understanding of the importance of proper follow up care.   This document serves as a record of services personally performed by Gardiner Sleeper, MD, PhD. It was created on their behalf by Orvan Falconer, an ophthalmic technician. The creation of this record is the provider's  dictation and/or activities during the visit.    Electronically signed by: Orvan Falconer, OA, 12/08/21  1:14 AM  This document serves as a record of services personally performed by Gardiner Sleeper, MD, PhD. It was created on their behalf by San Jetty. Owens Shark, OA an ophthalmic technician. The creation of this record is the provider's dictation and/or activities during the visit.    Electronically signed by: San Jetty. Owens Shark, [place] 08.09.2023 1:14 AM  Gardiner Sleeper, M.D., Ph.D. Diseases & Surgery of the Retina and Vitreous [facility]  I have reviewed the above documentation for accuracy and completeness, and I agree with the above. Gardiner Sleeper, M.D., Ph.D. 12/08/21 1:14 AM  Abbreviations: M myopia (nearsighted); A astigmatism; H hyperopia (farsighted); P presbyopia; Mrx spectacle prescription;  CTL contact lenses; OD right eye; OS left eye; OU both eyes  XT exotropia; ET esotropia; PEK punctate epithelial keratitis; PEE punctate epithelial erosions; DES dry eye syndrome; MGD meibomian gland dysfunction; ATs artificial tears; PFAT's preservative free artificial tears; Circle nuclear sclerotic cataract; PSC posterior subcapsular cataract; ERM epi-retinal membrane; PVD posterior vitreous detachment; RD retinal detachment; DM diabetes mellitus; DR diabetic retinopathy; NPDR non-proliferative diabetic retinopathy; PDR proliferative diabetic retinopathy; CSME clinically significant macular edema; DME diabetic macular edema; dbh dot blot hemorrhages; CWS cotton wool spot; POAG primary open angle glaucoma; C/D cup-to-disc ratio; HVF humphrey visual field; GVF goldmann visual field; OCT optical coherence tomography; IOP intraocular pressure; BRVO Branch retinal vein occlusion; CRVO central retinal vein occlusion; CRAO central retinal artery occlusion; BRAO branch retinal artery occlusion; RT retinal tear; SB scleral buckle; PPV pars plana vitrectomy; VH Vitreous hemorrhage; PRP  panretinal laser photocoagulation; IVK intravitreal kenalog; VMT vitreomacular traction; MH Macular hole;  NVD neovascularization of the disc; NVE neovascularization elsewhere; AREDS age related eye disease study; ARMD age related macular degeneration; POAG primary open angle glaucoma; EBMD epithelial/anterior basement membrane dystrophy; ACIOL anterior chamber intraocular lens; IOL intraocular lens; PCIOL posterior chamber intraocular lens; Phaco/IOL phacoemulsification with intraocular lens placement; [place] photorefractive keratectomy; LASIK laser assisted in situ keratomileusis; HTN hypertension; DM diabetes mellitus; COPD chronic obstructive pulmonary disease

## 2021-12-06 ENCOUNTER — Ambulatory Visit (INDEPENDENT_AMBULATORY_CARE_PROVIDER_SITE_OTHER): Payer: Medicare Other | Admitting: Ophthalmology

## 2021-12-06 DIAGNOSIS — H35413 Lattice degeneration of retina, bilateral: Secondary | ICD-10-CM | POA: Diagnosis not present

## 2021-12-06 DIAGNOSIS — H25813 Combined forms of age-related cataract, bilateral: Secondary | ICD-10-CM

## 2021-12-06 DIAGNOSIS — H33323 Round hole, bilateral: Secondary | ICD-10-CM

## 2021-12-06 DIAGNOSIS — H35033 Hypertensive retinopathy, bilateral: Secondary | ICD-10-CM | POA: Diagnosis not present

## 2021-12-06 DIAGNOSIS — I10 Essential (primary) hypertension: Secondary | ICD-10-CM | POA: Diagnosis not present

## 2021-12-06 DIAGNOSIS — E119 Type 2 diabetes mellitus without complications: Secondary | ICD-10-CM

## 2021-12-06 DIAGNOSIS — H3581 Retinal edema: Secondary | ICD-10-CM

## 2021-12-08 ENCOUNTER — Encounter (INDEPENDENT_AMBULATORY_CARE_PROVIDER_SITE_OTHER): Payer: Self-pay | Admitting: Ophthalmology

## 2021-12-14 ENCOUNTER — Ambulatory Visit: Payer: Medicare Other | Admitting: Family Medicine

## 2022-01-08 ENCOUNTER — Telehealth: Payer: Self-pay

## 2022-01-08 NOTE — Patient Outreach (Signed)
  Care Coordination   01/08/2022 Name: Haley Mendez MRN: [id number] DOB: 06/27/1955   Care Coordination Outreach Attempts:  An unsuccessful telephone outreach was attempted today to offer the patient information about available care coordination services as a benefit of their health plan.   Follow Up Plan:  Additional outreach attempts will be made to offer the patient care coordination information and services.   Encounter Outcome:  No Answer  Care Coordination Interventions Activated:  No    Care Coordination Interventions:  No, not indicated    Jone Baseman, RN, MSN [facility] Care Management Care Management Coordinator Direct Line [phone] Toll Free: [phone]  Fax: [phone]

## 2022-01-15 ENCOUNTER — Telehealth: Payer: Self-pay

## 2022-01-15 NOTE — Patient Outreach (Signed)
  Care Coordination   01/15/2022 Name: Haley Mendez MRN: [id number] DOB: 05-19-1955   Care Coordination Outreach Attempts:  A second unsuccessful outreach was attempted today to offer the patient with information about available care coordination services as a benefit of their health plan.     Follow Up Plan:  Additional outreach attempts will be made to offer the patient care coordination information and services.   Encounter Outcome:  No Answer  Care Coordination Interventions Activated:  No    Care Coordination Interventions:  No, not indicated    Jone Baseman, RN, MSN [facility] Care Management Care Management Coordinator Direct Line [phone]

## 2022-01-19 ENCOUNTER — Ambulatory Visit: Payer: Medicare Other | Admitting: Family Medicine

## 2022-01-23 ENCOUNTER — Telehealth: Payer: Self-pay

## 2022-01-23 ENCOUNTER — Other Ambulatory Visit: Payer: Self-pay | Admitting: Family Medicine

## 2022-01-23 NOTE — Patient Instructions (Signed)
Visit Information  Thank you for taking time to visit with me today. Please don't hesitate to contact me if I can be of assistance to you.   Following are the goals we discussed today:   Goals Addressed             This Visit's Progress    COMPLETED: Care Coordintion Activities-No follow up required       Care Coordination Interventions: Advised patient to Schedule annual wellness visit          If you are experiencing a Mental Health or [place] or need someone to talk to, please call the Suicide and Crisis Lifeline: 988   The patient verbalized understanding of instructions, educational materials, and care plan provided today and DECLINED offer to receive copy of patient instructions, educational materials, and care plan.   No further follow up required: patient decline  Jone Baseman, RN, MSN [facility] Care Management Coordinator Direct Line [phone]

## 2022-01-23 NOTE — Patient Outreach (Signed)
  Care Coordination   Initial Visit Note   01/23/2022 Name: Haley Mendez MRN: [id number] DOB: 02/25/1956  Haley Mendez is a 66 y.o. year old female who sees Jacelyn Grip, MD for primary care. I spoke with  [name] by phone today.  What matters to the patients health and wellness today?  none    Goals Addressed             This Visit's Progress    COMPLETED: Care Coordintion Activities-No follow up required       Care Coordination Interventions: Advised patient to Schedule annual wellness visit          SDOH assessments and interventions completed:  Yes      Care Coordination Interventions Activated:  Yes  Care Coordination Interventions:  Yes, provided   Follow up plan: No further intervention required.   Encounter Outcome:  Pt. Visit Completed   Jone Baseman, RN, MSN [facility] Care Management Coordinator Direct Line [phone]

## 2022-02-28 NOTE — Progress Notes (Unsigned)
    SUBJECTIVE:   CHIEF COMPLAINT / HPI:   Diabetes Patient states she is "not eating what she should" as she is drinking regular sodas and eats a lot of rice and noodles. States she varies her Lantus between 20-30 units based on her diet for that day. States CGM is too expensive for her and she has not been checking her sugars at home. Patient previously tried Ozempic but states it made her feel terrible and lose a bunch of weight because she wasn't eating.  Current Regimen: Metformin '500mg'$  BID, Jardiance CBGs: Not checking Last A1c:  Lab Results  Component Value Date   HGBA1C 9.0 (A) 03/01/2022    Denies polyuria, polydipsia, hypoglycemia. Last Eye Exam: Seen 11/2021 by Dr. Coralyn Pear Statin: Rosuvastatin '40mg'$  ACE/ARB: Losartan '25mg'$   PERTINENT  PMH / PSH: T2DM  OBJECTIVE:   BP 121/76   Pulse 70   Wt 142 lb 6.4 oz (64.6 kg)   SpO2 100%   BMI 25.23 kg/m    General: NAD, pleasant, able to participate in exam Cardiac: RRR, no murmurs. Respiratory: CTAB, normal effort, No wheezes, rales or rhonchi Abdomen: Bowel sounds present, nontender, nondistended Extremities: no edema or cyanosis. Skin: warm and dry, no rashes noted Neuro: alert, no obvious focal deficits Psych: Normal affect and mood Diabetic foot exam: 2+ pedal pulses. No ulcers or skin breakdown noted. Motor and sensation intact bilaterally.  PHQ-9: 1  ASSESSMENT/PLAN:   Diabetes (HCC) A1c 9.0 today. Increase Metformin '1000mg'$  BID. Patient taking between 20-30 units lantus daily - adjusted order and advised patient to take 24 units consistently. Discussed dietary changes, patient amenable to reduce soda and carb intake. Urine protein ratio today. 3 month f/u for A1c check.  TOBACCO ABUSE Interested in quitting, smokes 8 cigarettes per day. Willing to try Nicotine patch '14mg'$ . Declined further assistance.  Hyperlipidemia Lipid panel today.   Patient received her flu shot.   Dr. Colletta Maryland, [place]

## 2022-03-01 ENCOUNTER — Other Ambulatory Visit: Payer: Self-pay

## 2022-03-01 ENCOUNTER — Ambulatory Visit (INDEPENDENT_AMBULATORY_CARE_PROVIDER_SITE_OTHER): Payer: Medicare Other | Admitting: Family Medicine

## 2022-03-01 ENCOUNTER — Encounter: Payer: Self-pay | Admitting: Family Medicine

## 2022-03-01 VITALS — BP 121/76 | HR 70 | Wt 142.4 lb

## 2022-03-01 DIAGNOSIS — E785 Hyperlipidemia, unspecified: Secondary | ICD-10-CM

## 2022-03-01 DIAGNOSIS — E119 Type 2 diabetes mellitus without complications: Secondary | ICD-10-CM | POA: Diagnosis not present

## 2022-03-01 DIAGNOSIS — Z716 Tobacco abuse counseling: Secondary | ICD-10-CM | POA: Diagnosis not present

## 2022-03-01 DIAGNOSIS — F17200 Nicotine dependence, unspecified, uncomplicated: Secondary | ICD-10-CM | POA: Diagnosis not present

## 2022-03-01 DIAGNOSIS — E1100 Type 2 diabetes mellitus with hyperosmolarity without nonketotic hyperglycemic-hyperosmolar coma (NKHHC): Secondary | ICD-10-CM | POA: Diagnosis not present

## 2022-03-01 DIAGNOSIS — Z23 Encounter for immunization: Secondary | ICD-10-CM | POA: Diagnosis not present

## 2022-03-01 DIAGNOSIS — Z794 Long term (current) use of insulin: Secondary | ICD-10-CM

## 2022-03-01 LAB — POCT GLYCOSYLATED HEMOGLOBIN (HGB A1C): HbA1c, POC (controlled diabetic range): 9 % — AB (ref 0.0–7.0)

## 2022-03-01 MED ORDER — NICOTINE 14 MG/24HR TD PT24: 14.0000 mg | MEDICATED_PATCH | Freq: Every day | TRANSDERMAL | 3 refills | Status: DC

## 2022-03-01 MED ORDER — LANTUS SOLOSTAR 100 UNIT/ML ~~LOC~~ SOPN: 24.0000 [IU] | PEN_INJECTOR | Freq: Every day | SUBCUTANEOUS | 3 refills | Status: DC

## 2022-03-01 MED ORDER — METFORMIN HCL ER 500 MG PO TB24: 1000.0000 mg | ORAL_TABLET | Freq: Two times a day (BID) | ORAL | 1 refills | Status: DC

## 2022-03-01 NOTE — Assessment & Plan Note (Addendum)
A1c 9.0 today. Increase Metformin '1000mg'$  BID. Patient taking between 20-30 units lantus daily - adjusted order and advised patient to take 24 units consistently. Discussed dietary changes, patient amenable to reduce soda and carb intake. Urine protein ratio today. 3 month f/u for A1c check.

## 2022-03-01 NOTE — Assessment & Plan Note (Signed)
Interested in quitting, smokes 8 cigarettes per day. Willing to try Nicotine patch '14mg'$ . Declined further assistance.

## 2022-03-01 NOTE — Patient Instructions (Addendum)
It was great to see you today Haley Mendez!  Your A1C today was 9.0, which was increased from 7.9.  Please increase your metformin to 1000 mg (two 500 mg tablets) twice daily Continue Jardiance 25 mg daily Continue Lantus 24 units daily at bedtime  Look out for symptoms of low blood sugar like being dizzy, shaky, or sweaty. If you experience these symptoms, check your blood sugar. If the value is less than 70 mg/dL drink 1/2 cup of soda or orange juice and recheck your blood sugar in 15 minutes. If it is more than 70 mg/dL you may eat a small snack. If it is still less than 70 mg/dL, repeat your sugary snack.  Please start checking your blood sugar at least once a day in the morning before you eat anything (fasting). Your fasting blood sugar goal is less than 130 mg/dL. Occasionally, you should check your blood sugar 2 hours after eating. Your blood sugar goal after eating 180 mg/dL.   Do your best to increase your water intake to 3-4 bottles per day. Reduce your soda intake as you are able, or switch to diet soda products. It is very important to stay hydrated while you take Jardiance!  Start using nicotine 14 mg patches. Place one patch on your upper shoulder, upper arm, or back once daily. Replace the patch after 24 hours. After 8 weeks, if you have been able to cut back or quit smoking you can decrease to the nicotine 7 mg patch. After 8 more weeks, you may try to stop smoking and stop wearing the patch. Please let us know if you'd like to talk with the [facility] Medicine Pharmacist more about quitting smoking.   Please follow up in 3 months to check your A1c.  Thank you for choosing [facility].   Please call [phone] with any questions about today's appointment.  Please be sure to schedule follow up at the front desk before you leave today.   Colletta Maryland, DO Family Medicine

## 2022-03-01 NOTE — Assessment & Plan Note (Signed)
Lipid panel today

## 2022-03-02 LAB — LIPID PANEL
Chol/HDL Ratio: 3 ratio (ref 0.0–4.4)
Cholesterol, Total: 151 mg/dL (ref 100–199)
HDL: 50 mg/dL (ref >39)
LDL Chol Calc (NIH): 87 mg/dL (ref 0–99)
Triglycerides: 68 mg/dL (ref 0–149)
VLDL Cholesterol Cal: 14 mg/dL (ref 5–40)

## 2022-03-02 LAB — PROTEIN / CREATININE RATIO, URINE
Creatinine, Urine: 60.6 mg/dL
Protein, Ur: 32 mg/dL
Protein/Creat Ratio: 528 mg/g creat — ABNORMAL HIGH (ref 0–200)

## 2022-03-19 ENCOUNTER — Telehealth: Payer: Self-pay

## 2022-03-19 NOTE — Telephone Encounter (Signed)
Patient calls nurse line requesting to speak with provider regarding results from visit on 03/01/22.  Please return call to patient at [phone].  Talbot Grumbling, RN

## 2022-03-19 NOTE — Telephone Encounter (Signed)
Returned call to patient. She did not answer, LVM asking patient to return call to discuss further.   Talbot Grumbling, RN

## 2022-03-19 NOTE — Telephone Encounter (Signed)
Patient called requesting results. Discussed A1c results with patient in the office as she increased from 7.9 to 9.0 on recent lab work. We increased her to Metformin 1000 BID and I advised her we would recheck her A1c in 3 months. Patient urine creatinine was elevated but she is already on SGLT2 and ACEi and we will not change her management at this time. Lipids panel improved from prior and patient compliant on statin therapy, will continue to monitor.  Colletta Maryland, DO

## 2022-05-02 ENCOUNTER — Other Ambulatory Visit: Payer: Self-pay | Admitting: Family Medicine

## 2022-06-03 IMAGING — MG DIGITAL SCREENING BILAT W/ CAD
4 series · 4 of 4 positions shown · non-contrast
Comparison: Previous exam(s).

CLINICAL DATA: Screening.

EXAM:
DIGITAL SCREENING BILATERAL MAMMOGRAM WITH CAD

[L CC]
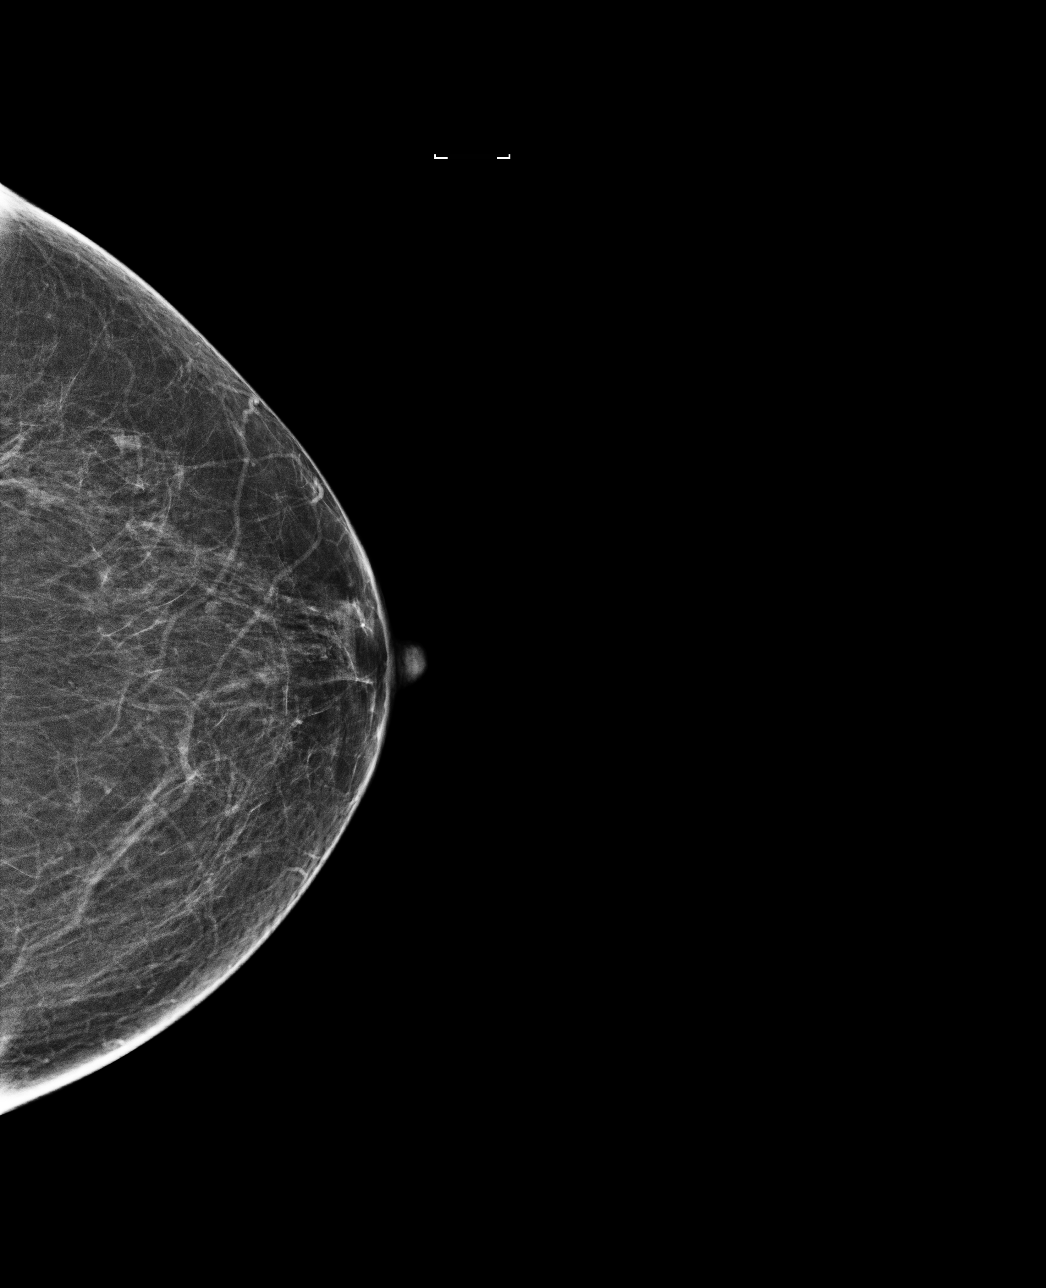

[L MLO]
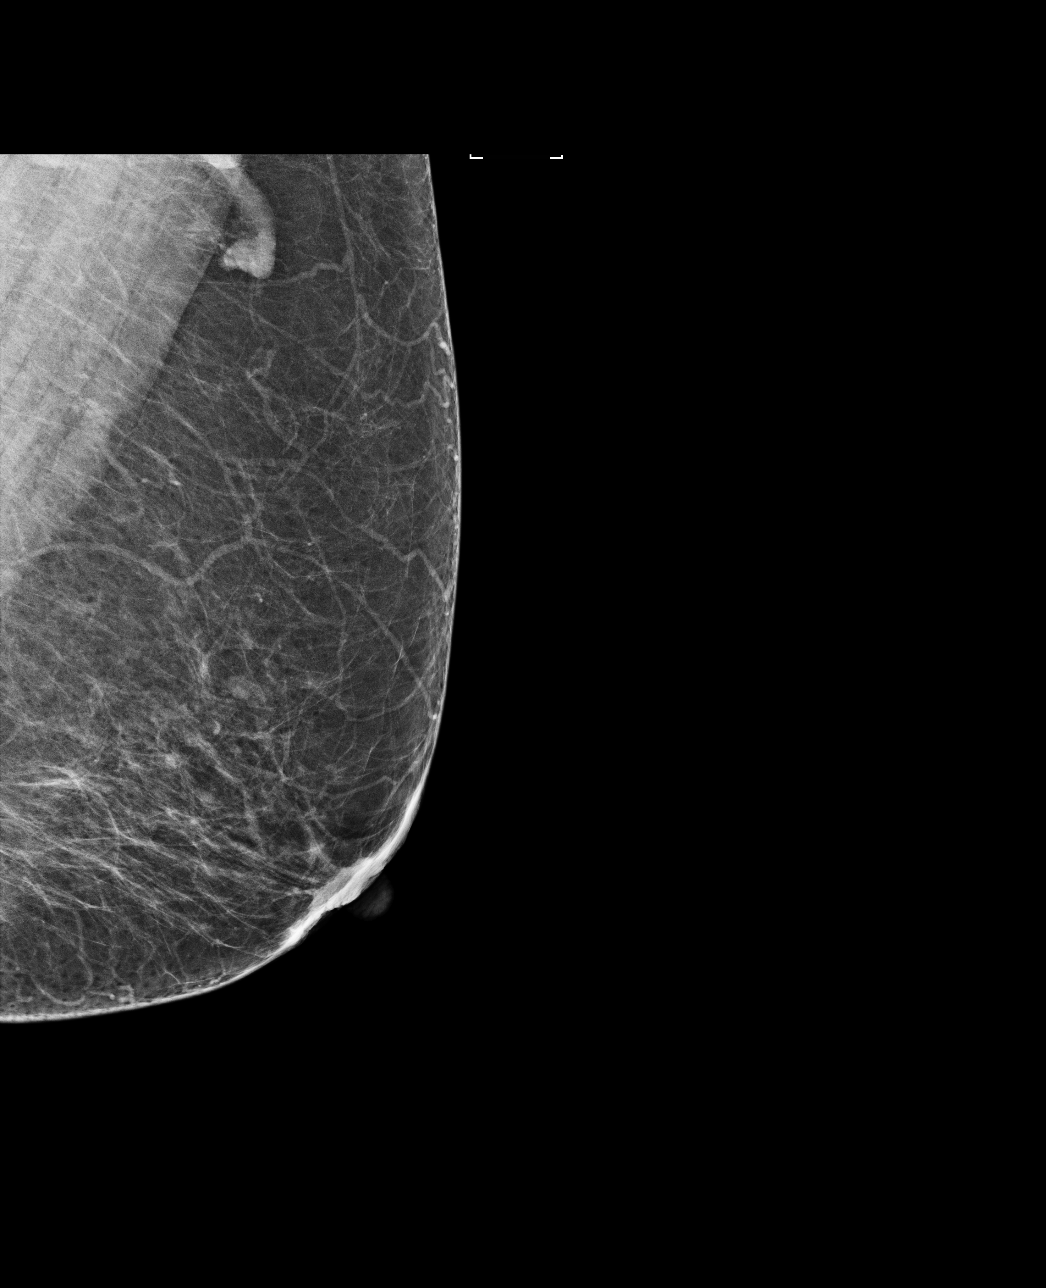

[R MLO]
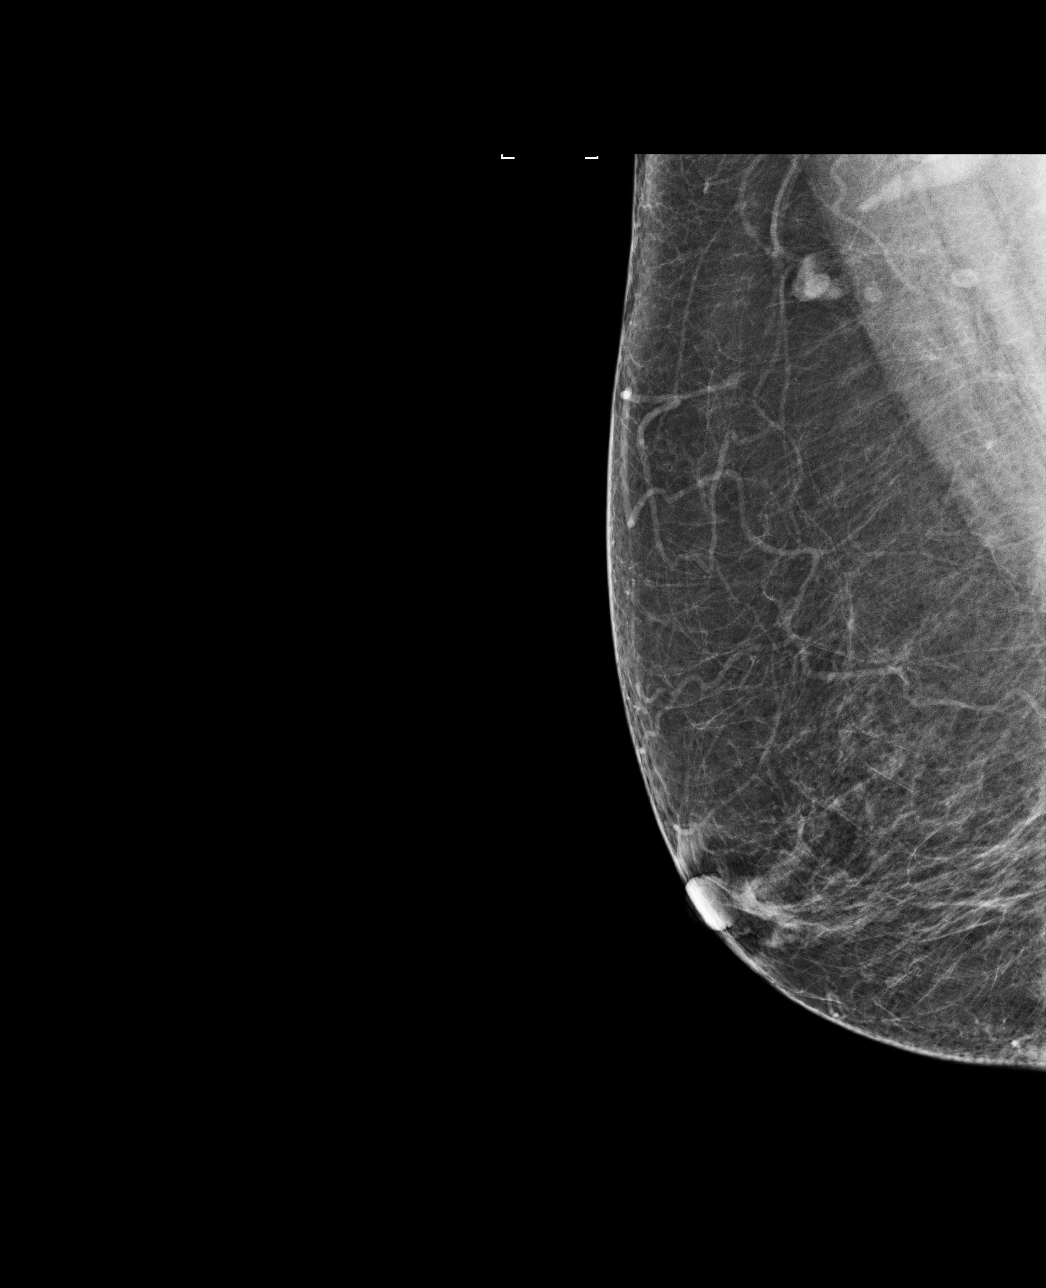

[R CC]
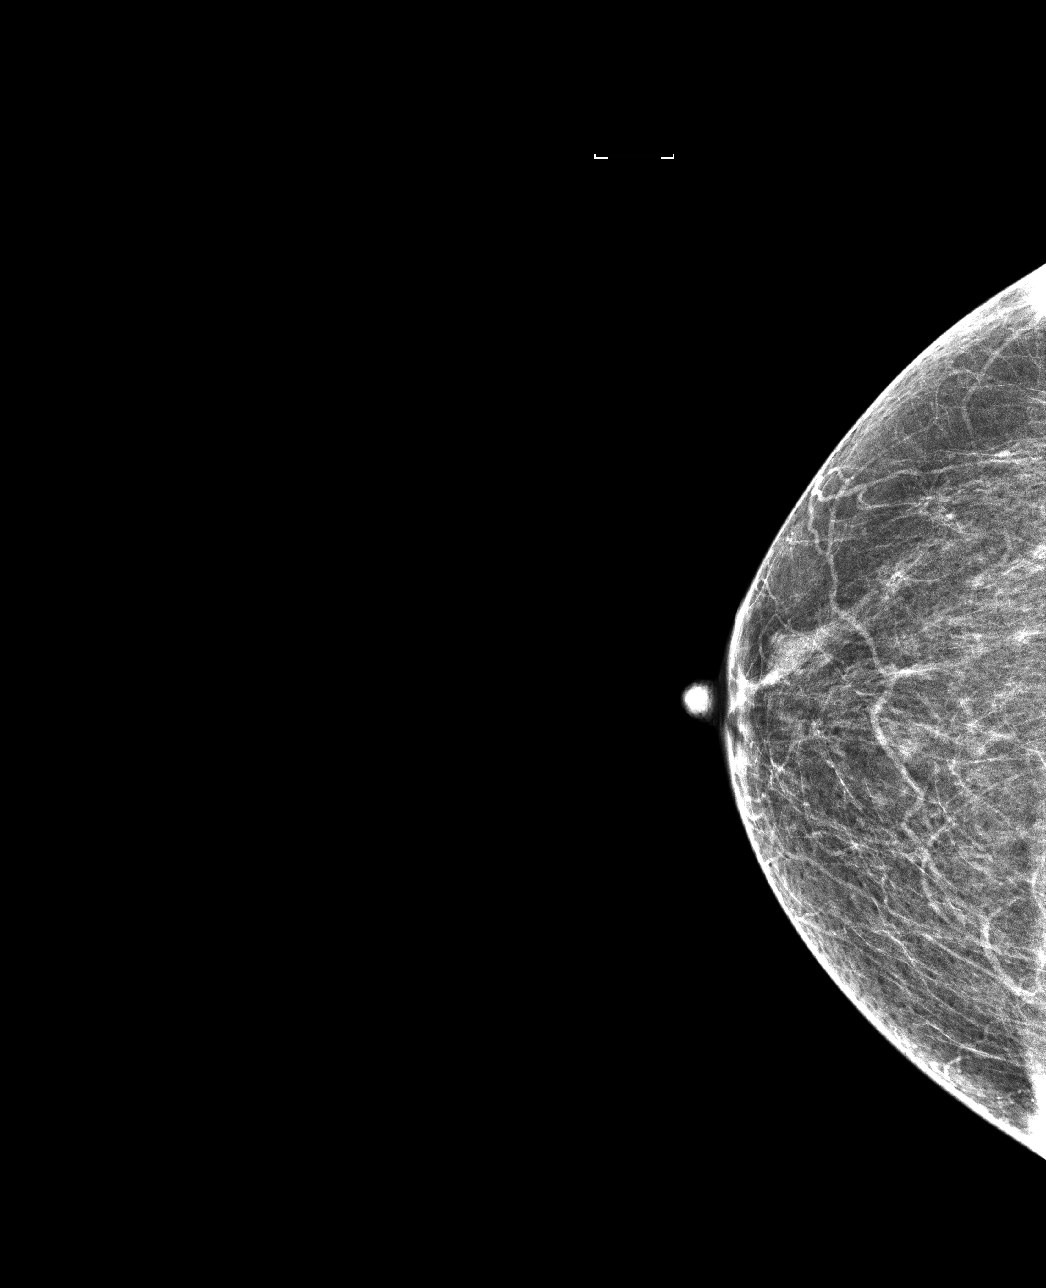

[4 of 4 positions shown; findings below may reference images not displayed]

ACR Breast Density Category b: There are scattered areas of
fibroglandular density.
FINDINGS: There are no findings suspicious for malignancy. Images were
processed with CAD.
IMPRESSION: No mammographic evidence of malignancy. A result letter of this
screening mammogram will be mailed directly to the patient.

RECOMMENDATION:
Screening mammogram in one year. (Code:AS-G-LCT)

BI-RADS CATEGORY  1: Negative.

## 2022-07-08 ENCOUNTER — Other Ambulatory Visit: Payer: Self-pay | Admitting: Family Medicine

## 2022-07-09 ENCOUNTER — Encounter: Payer: Self-pay | Admitting: Family Medicine

## 2022-07-20 ENCOUNTER — Other Ambulatory Visit: Payer: Self-pay | Admitting: Family Medicine

## 2022-07-20 NOTE — Telephone Encounter (Signed)
Did not refill duplicate metformin [id number] mg daily prescription as it appears she has a second Rx for 1000 mg daily. It was documented that she should be taking 1000 mg BID in most recent note. Will alter Rx in chart.

## 2022-07-24 NOTE — Telephone Encounter (Signed)
Called and scheduled patient  Thanks Jerene Pitch

## 2022-08-09 ENCOUNTER — Ambulatory Visit: Payer: Medicare Other | Admitting: Family Medicine

## 2022-08-16 ENCOUNTER — Other Ambulatory Visit: Payer: Self-pay | Admitting: Family Medicine

## 2022-09-27 ENCOUNTER — Other Ambulatory Visit: Payer: Self-pay | Admitting: Family Medicine

## 2022-09-27 DIAGNOSIS — Z1231 Encounter for screening mammogram for malignant neoplasm of breast: Secondary | ICD-10-CM

## 2022-10-10 ENCOUNTER — Encounter: Payer: Self-pay | Admitting: Family Medicine

## 2022-10-10 ENCOUNTER — Ambulatory Visit (INDEPENDENT_AMBULATORY_CARE_PROVIDER_SITE_OTHER): Payer: Medicare HMO | Admitting: Family Medicine

## 2022-10-10 VITALS — BP 124/64 | HR 63 | Ht 63.0 in | Wt 141.4 lb

## 2022-10-10 DIAGNOSIS — F17200 Nicotine dependence, unspecified, uncomplicated: Secondary | ICD-10-CM | POA: Diagnosis not present

## 2022-10-10 DIAGNOSIS — E119 Type 2 diabetes mellitus without complications: Secondary | ICD-10-CM

## 2022-10-10 DIAGNOSIS — E785 Hyperlipidemia, unspecified: Secondary | ICD-10-CM

## 2022-10-10 DIAGNOSIS — I10 Essential (primary) hypertension: Secondary | ICD-10-CM

## 2022-10-10 LAB — POCT GLYCOSYLATED HEMOGLOBIN (HGB A1C): HbA1c, POC (controlled diabetic range): 8.3 % — AB (ref 0.0–7.0)

## 2022-10-10 MED ORDER — LANTUS SOLOSTAR 100 UNIT/ML ~~LOC~~ SOPN: 24.0000 [IU] | PEN_INJECTOR | Freq: Every day | SUBCUTANEOUS | 3 refills | Status: DC

## 2022-10-10 MED ORDER — NICOTINE 7 MG/24HR TD PT24: 7.0000 mg | MEDICATED_PATCH | Freq: Every day | TRANSDERMAL | 11 refills | Status: DC

## 2022-10-10 MED ORDER — NICOTINE POLACRILEX 2 MG MT GUM
2.0000 mg | CHEWING_GUM | OROMUCOSAL | 11 refills | Status: DC | PRN
Start: 2022-10-10 — End: 2023-08-12

## 2022-10-10 MED ORDER — TRUEPLUS PEN NEEDLES 32G X 4 MM MISC: 3 refills | Status: DC

## 2022-10-10 MED ORDER — EMPAGLIFLOZIN 25 MG PO TABS: 25.0000 mg | ORAL_TABLET | Freq: Every day | ORAL | 3 refills | Status: DC

## 2022-10-10 MED ORDER — ROSUVASTATIN CALCIUM 40 MG PO TABS: 40.0000 mg | ORAL_TABLET | Freq: Every day | ORAL | 3 refills | Status: DC

## 2022-10-10 MED ORDER — TRUEPLUS LANCETS 28G MISC
1.0000 | Freq: Three times a day (TID) | 12 refills | Status: DC
Start: 2022-10-10 — End: 2024-02-17

## 2022-10-10 MED ORDER — ZOSTER VAC RECOMB ADJUVANTED 50 MCG/0.5ML IM SUSR: 0.5000 mL | Freq: Once | INTRAMUSCULAR | 1 refills | Status: DC

## 2022-10-10 MED ORDER — METFORMIN HCL ER (MOD) 1000 MG PO TB24: 1000.0000 mg | ORAL_TABLET | Freq: Two times a day (BID) | ORAL | 3 refills | Status: DC

## 2022-10-10 MED ORDER — LOSARTAN POTASSIUM 25 MG PO TABS
12.5000 mg | ORAL_TABLET | Freq: Every day | ORAL | 3 refills | Status: DC
Start: 2022-10-10 — End: 2023-08-12

## 2022-10-10 NOTE — Assessment & Plan Note (Signed)
A1c improving, 8.3 today down from 9.0 last check.  Will continue with medications without changes.  Refill sent.  Current regimen includes Jardiance 25 mg daily, Lantus 24 units nightly, metformin 1000 mg twice daily, rosuvastatin 40 mg daily.  Next A1c check in 3 months.  If at that time A1c continues to improve or hold stable, could discuss coming off of daily insulin in favor of once weekly injectable such as Trulicity, Victoza, or Ozempic.  Also consider prescription of empagliflozin-metformin combination pills to reduce pill burden if patient still tolerating medications well.

## 2022-10-10 NOTE — Assessment & Plan Note (Signed)
Patient interested in quitting.  Politely declines appointment with Dr. Raymondo Band.  Is interested in a lung cancer screening as well. - Quit line resources - Rx for nicotine gum and patches - Low-dose lung cancer screening with CT

## 2022-10-10 NOTE — Patient Instructions (Addendum)
It was wonderful to see you today. Thank you for allowing me to be a part of your care. Below is a short summary of what we discussed at your visit today:  Diabetes Today your A1c is 8.3, improved from 9.0 last check.  I have refilled all of your medications including Jardiance, metformin, and insulin.  Please come back in 3 months for your next check with your primary care doctor.  If your A1c continues to go in the right direction, we can talk about stopping the insulin in favor of a once weekly injection or other medications.  I have referred you to ophthalmology for routine diabetic eye exam.  Someone from their office should be calling you in 1 to 2 weeks to schedule an appointment.  If you do not hear from them, let us know. We may need to nudge along the referral.    Blood pressure Cut back to half a tab of losartan per day. Come back in 1 to 2 weeks for blood pressure check.  Blood work Blood work today check in on your kidneys and cholesterol.  Urine sample to check in on any diabetes damage to the kidneys.  I will send you a letter to have the results in front of you.  If anything is abnormal, I will give you a call.  Medicare Annual Wellness Exam Your chart indicates that you are due for your Medicare annual wellness exam.  Your insurance likes [place] to do one of these every year.  This is a nurse only visit that takes about 30 minutes to an hour.  It can be done either in person or virtually.  This is an in-depth visit that focuses on preventative care and keeping you healthy.  Somebody from our clinic will be calling you soon to get this scheduled.  Shingles Vaccine I have written a prescription for the shingles vaccine.  Please take this to your pharmacy to have this administered. Your insurance prefers you get this specific vaccine at the pharmacy instead of in clinic.   Please bring all of your medications to every appointment!  If you have any questions or concerns, please do  not hesitate to contact us via phone or MyChart message.   Fayette Pho, MD

## 2022-10-10 NOTE — Assessment & Plan Note (Signed)
Currently on rosuvastatin 40 mg daily.  Tolerating well without adverse side effects.  Current 10 year ASCVD risk 31.1%. Will check direct LDL today, could consider zetia or other adjunctive therapies if LDL still greater than 70.   The 10-year ASCVD risk score (Arnett DK, et al., 2019) is: 31.1%   Values used to calculate the score:     Age: 67 years     Sex: Female     Is Non-Hispanic African American: Yes     Diabetic: Yes     Tobacco smoker: Yes     Systolic Blood Pressure: 124 mmHg     Is BP treated: Yes     HDL Cholesterol: 50 mg/dL     Total Cholesterol: 151 mg/dL

## 2022-10-10 NOTE — Assessment & Plan Note (Addendum)
Blood pressure controlled on losartan 25 mg nightly.  However patient reporting adverse side effects of intermittent dizziness and orthostatic hypotension.  No BP cuff at home, checks blood pressures at [facility] sometimes where she finds her systolics in the 100s and 110s.  Will reduce her losartan to a half tab (12.5 mg) nightly.  Follow-up in 1 to 2 weeks for blood pressure recheck. CMP today to check creatinine and potassium on the losartan. No need for repeat blood work in 1 to 2 weeks. Would titrate to standing diastolic if concern for orthostatic hypotension.

## 2022-10-10 NOTE — Progress Notes (Signed)
SUBJECTIVE:   CHIEF COMPLAINT / HPI:   T2DM Current regimen: Jardiance 25, Lantus 24 units bedtime, Metformin 1000 mg twice daily, Rosuvastatin 40 mg daily.   Tolerating medications well without adverse side effects.  Lab Results  Component Value Date   HGBA1C 8.3 (A) 10/10/2022   HGBA1C 9.0 (A) 03/01/2022   HGBA1C 7.9 (A) 08/10/2021   Lab Results  Component Value Date   MICROALBUR 0.9 12/02/2015   LDLCALC 87 03/01/2022   CREATININE 0.89 08/10/2021   HTN Current regimen: Losartan 25 mg nightly Reports intermittent dizziness and orthostatic hypotension. Does not check blood pressure at home, but does check at [facility] where she finds her systolics in the 100s and 110s.  BP Readings from Last 3 Encounters:  10/10/22 124/64  03/01/22 121/76  08/10/21 136/70    Lung cancer screening - currently 7 cig/day; has reduced amount greatly - since 20s, 30s...used to smoked closer to 1 PPD - interested in lung ca screening  PERTINENT  PMH / PSH:  Patient Active Problem List   Diagnosis Date Noted   Dyspepsia 01/30/2020   Hypertension 04/10/2012   Toenail fungus 04/10/2012   HLD, primary prevention, LDL goal <70 04/10/2012   TOBACCO ABUSE 12/30/2009   LUMBAGO 12/30/2009   MENOPAUSE-RELATED VASOMOTOR SYMPTOMS, HOT FLASHES 07/07/2009   Diabetes (HCC) 06/09/2009   GERD 06/09/2009   OSTEOARTHRITIS 06/09/2009    OBJECTIVE:   BP 124/64   Pulse 63   Ht 5\' 3"  (1.6 m)   Wt 141 lb 6.4 oz (64.1 kg)   SpO2 98%   BMI 25.05 kg/m    PHQ-9:     10/10/2022    8:31 AM 03/01/2022    9:31 AM 08/10/2021    9:28 AM  Depression screen PHQ 2/9  Decreased Interest 0 0 0  Down, Depressed, Hopeless 0 0 0  PHQ - 2 Score 0 0 0  Altered sleeping 2 1 1   Tired, decreased energy 1 0 1  Change in appetite 0 0 1  Feeling bad or failure about yourself  0 0 0  Trouble concentrating 0 0 0  Moving slowly or fidgety/restless 0 0 0  Suicidal thoughts 0 0 0  PHQ-9 Score 3 1 3   Difficult doing  work/chores Somewhat difficult Not difficult at all Somewhat difficult    Physical Exam General: Awake, alert, oriented Cardiovascular: Regular rate and rhythm, S1 and S2 present, no murmurs auscultated Respiratory: Lung fields clear to auscultation bilaterally  ASSESSMENT/PLAN:   Diabetes (HCC) A1c improving, 8.3 today down from 9.0 last check.  Will continue with medications without changes.  Refill sent.  Current regimen includes Jardiance 25 mg daily, Lantus 24 units nightly, metformin 1000 mg twice daily, rosuvastatin 40 mg daily.  Next A1c check in 3 months.  If at that time A1c continues to improve or hold stable, could discuss coming off of daily insulin in favor of once weekly injectable such as Trulicity, Victoza, or Ozempic.  Also consider prescription of empagliflozin-metformin combination pills to reduce pill burden if patient still tolerating medications well.  Hypertension Blood pressure controlled on losartan 25 mg nightly.  However patient reporting adverse side effects of intermittent dizziness and orthostatic hypotension.  No BP cuff at home, checks blood pressures at [facility] sometimes where she finds her systolics in the 100s and 110s.  Will reduce her losartan to a half tab (12.5 mg) nightly.  Follow-up in 1 to 2 weeks for blood pressure recheck. CMP today to check creatinine and  potassium on the losartan. No need for repeat blood work in 1 to 2 weeks. Would titrate to standing diastolic if concern for orthostatic hypotension.  HLD, primary prevention, LDL goal <70 Currently on rosuvastatin 40 mg daily.  Tolerating well without adverse side effects.  Current 10 year ASCVD risk 31.1%. Will check direct LDL today, could consider zetia or other adjunctive therapies if LDL still greater than 70.   The 10-year ASCVD risk score (Arnett DK, et al., 2019) is: 31.1%   Values used to calculate the score:     Age: 67 years     Sex: Female     Is Non-Hispanic African American:  Yes     Diabetic: Yes     Tobacco smoker: Yes     Systolic Blood Pressure: 124 mmHg     Is BP treated: Yes     HDL Cholesterol: 50 mg/dL     Total Cholesterol: 151 mg/dL  TOBACCO ABUSE Patient interested in quitting.  Politely declines appointment with Dr. Raymondo Band.  Is interested in a lung cancer screening as well. - Quit line resources - Rx for nicotine gum and patches - Low-dose lung cancer screening with CT    Fayette Pho, MD [facility]

## 2022-10-11 LAB — COMPREHENSIVE METABOLIC PANEL
ALT: 14 IU/L (ref 0–32)
AST: 19 IU/L (ref 0–40)
Albumin/Globulin Ratio: 1.8
Albumin: 4.5 g/dL (ref 3.9–4.9)
Alkaline Phosphatase: 103 IU/L (ref 44–121)
BUN/Creatinine Ratio: 15 (ref 12–28)
BUN: 14 mg/dL (ref 8–27)
Bilirubin Total: 0.2 mg/dL (ref 0.0–1.2)
CO2: 19 mmol/L — ABNORMAL LOW (ref 20–29)
Calcium: 9.5 mg/dL (ref 8.7–10.3)
Chloride: 106 mmol/L (ref 96–106)
Creatinine, Ser: 0.96 mg/dL (ref 0.57–1.00)
Globulin, Total: 2.5 g/dL (ref 1.5–4.5)
Glucose: 122 mg/dL — ABNORMAL HIGH (ref 70–99)
Potassium: 3.9 mmol/L (ref 3.5–5.2)
Sodium: 143 mmol/L (ref 134–144)
Total Protein: 7 g/dL (ref 6.0–8.5)
eGFR: 65 mL/min/{1.73_m2} (ref >59)

## 2022-10-11 LAB — MICROALBUMIN / CREATININE URINE RATIO
Creatinine, Urine: 55.2 mg/dL
Microalb/Creat Ratio: 383 mg/g creat — ABNORMAL HIGH (ref 0–29)
Microalbumin, Urine: 211.6 ug/mL

## 2022-10-11 LAB — LDL CHOLESTEROL, DIRECT: LDL Direct: 54 mg/dL (ref 0–99)

## 2022-10-12 ENCOUNTER — Encounter: Payer: Self-pay | Admitting: Family Medicine

## 2022-10-13 ENCOUNTER — Other Ambulatory Visit: Payer: Self-pay | Admitting: Family Medicine

## 2022-10-13 DIAGNOSIS — E785 Hyperlipidemia, unspecified: Secondary | ICD-10-CM

## 2022-10-15 ENCOUNTER — Telehealth: Payer: Self-pay

## 2022-10-15 DIAGNOSIS — E1100 Type 2 diabetes mellitus with hyperosmolarity without nonketotic hyperglycemic-hyperosmolar coma (NKHHC): Secondary | ICD-10-CM

## 2022-10-15 NOTE — Progress Notes (Signed)
Office admin called to make [employer] Visit appointment. Patient declined, stating she has previously told Medicare she was not interested.   I will postpone the Medicare AWV in her health maintenance tab.   Fayette Pho, MD

## 2022-10-15 NOTE — Telephone Encounter (Signed)
Rec'd PA request for patients Metformin ER 1000mg  (MOD).   Was this meant to be the extended-release or should it be immediate-release 1000mg  BID?

## 2022-10-16 ENCOUNTER — Ambulatory Visit: Payer: Medicare HMO

## 2022-10-17 MED ORDER — METFORMIN HCL 1000 MG PO TABS
1000.0000 mg | ORAL_TABLET | Freq: Two times a day (BID) | ORAL | 3 refills | Status: DC
Start: 2022-10-17 — End: 2023-01-08

## 2022-10-17 NOTE — Telephone Encounter (Signed)
Sent in new script for immediate release metformin.  Notification to patient included in medication sig.   Fayette Pho, MD

## 2022-10-29 ENCOUNTER — Ambulatory Visit
Admission: RE | Admit: 2022-10-29 | Discharge: 2022-10-29 | Disposition: A | Discharge status: Home / Self Care | Payer: Medicare HMO | Source: Ambulatory Visit | Attending: Family Medicine | Admitting: Family Medicine

## 2022-10-29 DIAGNOSIS — Z1231 Encounter for screening mammogram for malignant neoplasm of breast: Secondary | ICD-10-CM | POA: Diagnosis not present

## 2022-11-14 ENCOUNTER — Encounter (INDEPENDENT_AMBULATORY_CARE_PROVIDER_SITE_OTHER): Payer: Medicare Other | Admitting: Ophthalmology

## 2022-11-14 DIAGNOSIS — H33323 Round hole, bilateral: Secondary | ICD-10-CM

## 2022-11-14 DIAGNOSIS — H25813 Combined forms of age-related cataract, bilateral: Secondary | ICD-10-CM

## 2022-11-14 DIAGNOSIS — H35413 Lattice degeneration of retina, bilateral: Secondary | ICD-10-CM

## 2022-11-14 DIAGNOSIS — I10 Essential (primary) hypertension: Secondary | ICD-10-CM

## 2022-11-14 DIAGNOSIS — E119 Type 2 diabetes mellitus without complications: Secondary | ICD-10-CM

## 2022-11-14 DIAGNOSIS — H35033 Hypertensive retinopathy, bilateral: Secondary | ICD-10-CM

## 2022-12-17 ENCOUNTER — Other Ambulatory Visit: Payer: Self-pay | Admitting: Family Medicine

## 2022-12-17 DIAGNOSIS — E119 Type 2 diabetes mellitus without complications: Secondary | ICD-10-CM

## 2023-01-08 ENCOUNTER — Other Ambulatory Visit: Payer: Self-pay | Admitting: Family Medicine

## 2023-01-08 DIAGNOSIS — E119 Type 2 diabetes mellitus without complications: Secondary | ICD-10-CM

## 2023-03-12 ENCOUNTER — Ambulatory Visit: Payer: Medicare HMO | Admitting: Family Medicine

## 2023-04-03 ENCOUNTER — Other Ambulatory Visit: Payer: Self-pay | Admitting: Family Medicine

## 2023-04-03 DIAGNOSIS — E119 Type 2 diabetes mellitus without complications: Secondary | ICD-10-CM

## 2023-04-09 ENCOUNTER — Ambulatory Visit: Payer: Medicare HMO | Admitting: Family Medicine

## 2023-04-26 ENCOUNTER — Encounter: Payer: Self-pay | Admitting: Family Medicine

## 2023-04-26 ENCOUNTER — Ambulatory Visit (INDEPENDENT_AMBULATORY_CARE_PROVIDER_SITE_OTHER): Payer: Medicare HMO | Admitting: Family Medicine

## 2023-04-26 VITALS — BP 95/79 | HR 66 | Ht 63.0 in | Wt 138.2 lb

## 2023-04-26 DIAGNOSIS — F17200 Nicotine dependence, unspecified, uncomplicated: Secondary | ICD-10-CM | POA: Diagnosis not present

## 2023-04-26 DIAGNOSIS — Z794 Long term (current) use of insulin: Secondary | ICD-10-CM | POA: Diagnosis not present

## 2023-04-26 DIAGNOSIS — R0981 Nasal congestion: Secondary | ICD-10-CM

## 2023-04-26 DIAGNOSIS — E1100 Type 2 diabetes mellitus with hyperosmolarity without nonketotic hyperglycemic-hyperosmolar coma (NKHHC): Secondary | ICD-10-CM

## 2023-04-26 DIAGNOSIS — I10 Essential (primary) hypertension: Secondary | ICD-10-CM | POA: Diagnosis not present

## 2023-04-26 HISTORY — DX: Nasal congestion: R09.81

## 2023-04-26 LAB — POCT GLYCOSYLATED HEMOGLOBIN (HGB A1C): HbA1c, POC (controlled diabetic range): 9.8 % — AB (ref 0.0–7.0)

## 2023-04-26 MED ORDER — NICOTINE 7 MG/24HR TD PT24: 7.0000 mg | MEDICATED_PATCH | Freq: Every day | TRANSDERMAL | 11 refills | Status: DC

## 2023-04-26 NOTE — Patient Instructions (Signed)
STOP the losartan given your low blood pressure. CONTINUE lantus 24 units at night. Let me know if your blood sugar is less than 150 in the mornings. TRY flonase and a neti pot for your sinuses. I am happy this is getting better. We can chat more about vaccines at a later time.

## 2023-04-26 NOTE — Progress Notes (Signed)
    SUBJECTIVE:   CHIEF COMPLAINT / HPI:   T2DM Without to get her A1c rechecked today.  She has been taking her Jardiance 25 mg daily, metformin 2000 mg daily, and Crestor 40 mg daily.  She has not been taking her Lantus 24 units at bedtime consistently.  She has not been checking her blood glucoses much, either.  She has also been drinking a lot of sodas recently.  Hypertension Blood pressure has continued to be lower over the last couple visits per patient.  She has been taking losartan 12.5 mg at bedtime.  She does endorse symptoms of dizziness and feeling off balance when she stands up at times.  Sinus pressure, congestion Present for about a month.  Overall improving with over-the-counter medications.  However, she is still coughing up some phlegm at times.  No shortness of breath or chest pain.  Has not tried any antihistamines or nasal sprays.  Tobacco use Smokes about 10 cigarettes/day on average.  She would like to stop.  She has tried nicotine gum without much relief.  Appears to have tried nicotine patches in the past as well without relief.  Would be interested in trying patches again today.  PERTINENT  PMH / PSH: GERD, OA, menopause, HLD  OBJECTIVE:   BP 95/79   Pulse 66   Ht 5\' 3"  (1.6 m)   Wt 138 lb 3.2 oz (62.7 kg)   SpO2 99%   BMI 24.48 kg/m   General: Alert and oriented, in NAD Skin: Warm, dry, and intact  HEENT: NCAT, EOM grossly normal, midline nasal septum Cardiac: RRR, no m/r/g appreciated Respiratory: CTAB, breathing and speaking comfortably on RA Extremities: Moves all extremities grossly equally Neurological: No gross focal deficit Psychiatric: Appropriate mood and affect   ASSESSMENT/PLAN:   Hypertension Given persistent symptoms of orthostasis, with lower BP today, we will discontinue losartan 12.5 mg daily and assess for symptom improvement.  Diabetes (HCC) A1c today 9.8 up from 8.3 earlier this year.  Likely in setting of difficulty with  taking Lantus.  Advised to take Lantus every night and measure glucose at least once a day, preferably in the morning.  Advised to call if glucose is less than 150 after taking Lantus at night.  Continue Jardiance and metformin.  If continued difficulties with this regimen, consider cessation of insulin and initiation of GLP-1.  Will need to repeat urine microalbumin/creatinine ratio in future given previous elevated and especially with discontinuation of ARB.  TOBACCO ABUSE Will refill 7 mg nicotine patches.  Advised to use daily and try to go down to 8 cigarettes/day.  Discussed potential side effects of dreams if wearing patches overnight.  Follow-up in 1 month.  Sinus congestion Overall improving.  Reassured by pulmonary exam today.  Consider viral process with potential superimposed resolving bacterial sinusitis.  Advised to use Flonase and a Nettie pot to help cleanse sinuses.  Follow-up if does not continue to improve.   Health maintenance Would like to hold off on vaccines for now.  Janeal Holmes, MD [facility]

## 2023-04-26 NOTE — Assessment & Plan Note (Addendum)
A1c today 9.8 up from 8.3 earlier this year.  Likely in setting of difficulty with taking Lantus.  Advised to take Lantus every night and measure glucose at least once a day, preferably in the morning.  Advised to call if glucose is less than 150 after taking Lantus at night.  Continue Jardiance and metformin.  If continued difficulties with this regimen, consider cessation of insulin and initiation of GLP-1.  Will need to repeat urine microalbumin/creatinine ratio in future given previous elevated and especially with discontinuation of ARB.

## 2023-04-26 NOTE — Assessment & Plan Note (Signed)
Overall improving.  Reassured by pulmonary exam today.  Consider viral process with potential superimposed resolving bacterial sinusitis.  Advised to use Flonase and a Nettie pot to help cleanse sinuses.  Follow-up if does not continue to improve.

## 2023-04-26 NOTE — Assessment & Plan Note (Signed)
Will refill 7 mg nicotine patches.  Advised to use daily and try to go down to 8 cigarettes/day.  Discussed potential side effects of dreams if wearing patches overnight.  Follow-up in 1 month.

## 2023-04-26 NOTE — Assessment & Plan Note (Signed)
Given persistent symptoms of orthostasis, with lower BP today, we will discontinue losartan 12.5 mg daily and assess for symptom improvement.

## 2023-04-27 IMAGING — CT CT ABD-PELV W/O CM
1 of 3 series · 14 of 32 positions shown, 19 images · non-contrast
Comparison: None.

CLINICAL DATA: Worsening chronic abdominal pain.  Cholelithiasis.

EXAM:
CT ABDOMEN AND PELVIS WITHOUT CONTRAST
TECHNIQUE: Multidetector CT imaging of the abdomen and pelvis was performed
following the standard protocol without IV contrast.

[Series 2: abd/pelvis w/(date) · axial · 0.69mm/px · z∈[-391,-31]mm · 14 of 82 slices shown, 19 images]
[im 5/82  soft-tissue]
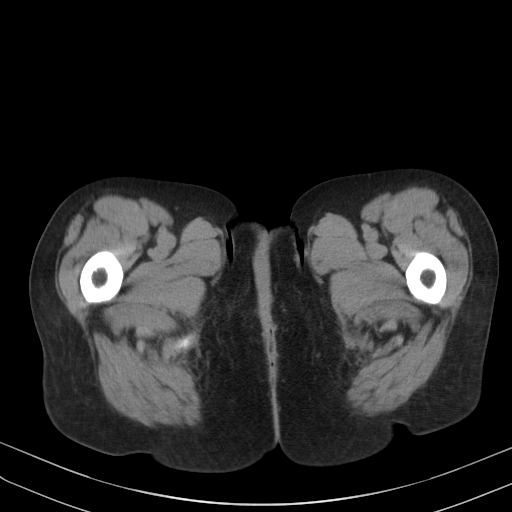
[im 5/82  bone]
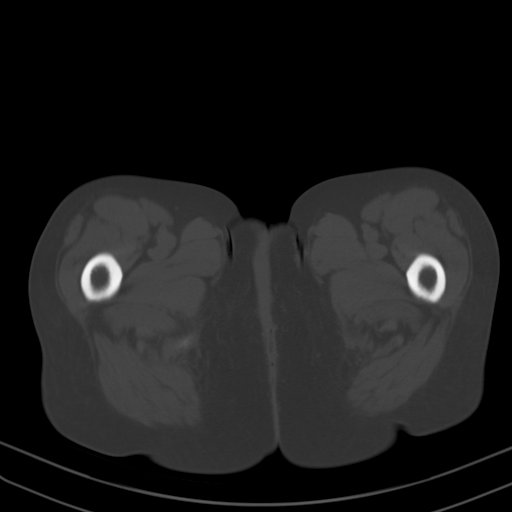
[im 10/82  soft-tissue]
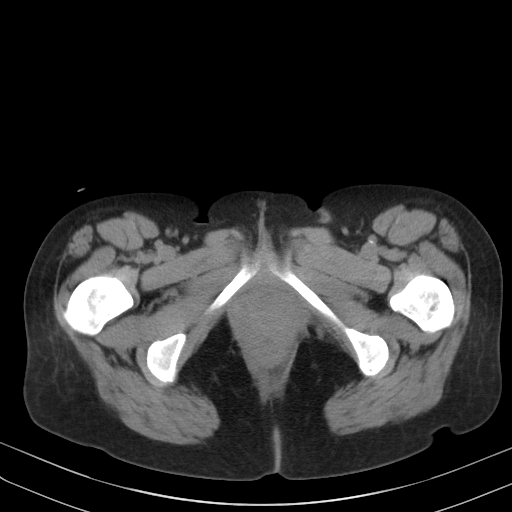
[im 20/82  soft-tissue]
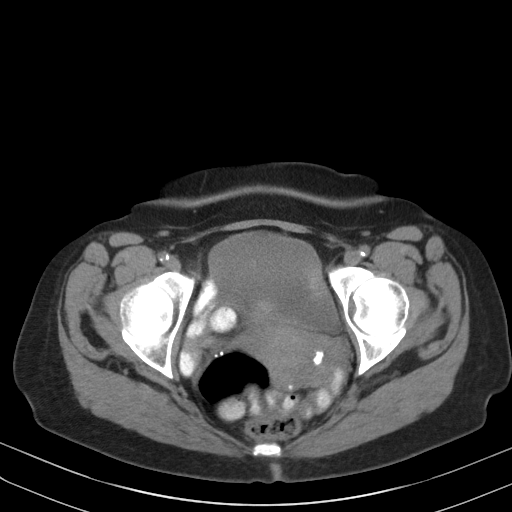
[im 24/82  soft-tissue]
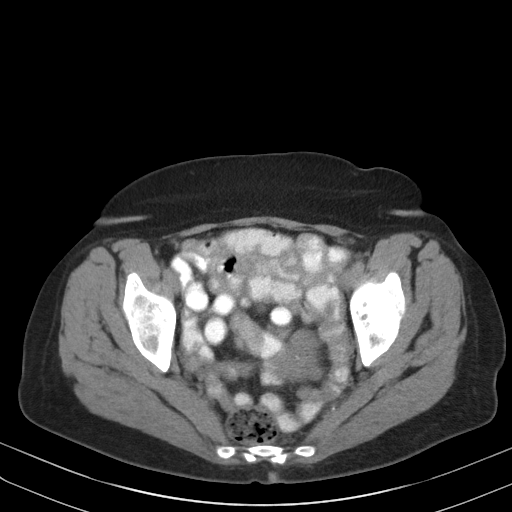
[im 29/82  soft-tissue]
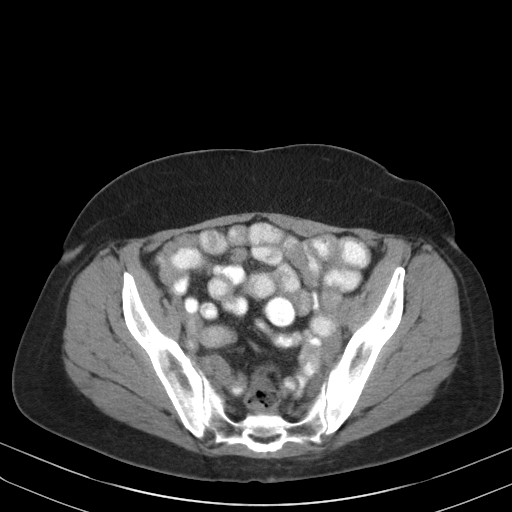
[im 34/82  soft-tissue]
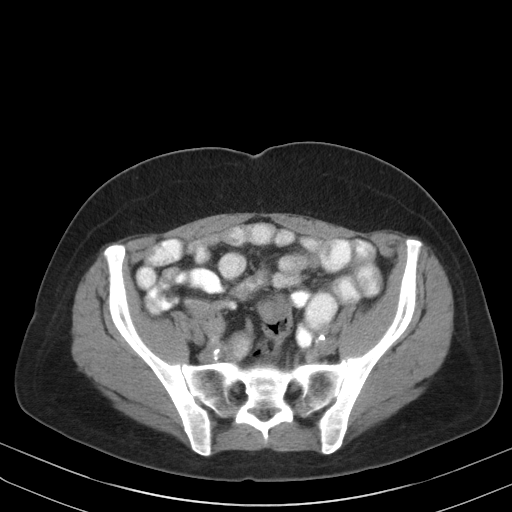
[im 43/82  soft-tissue]
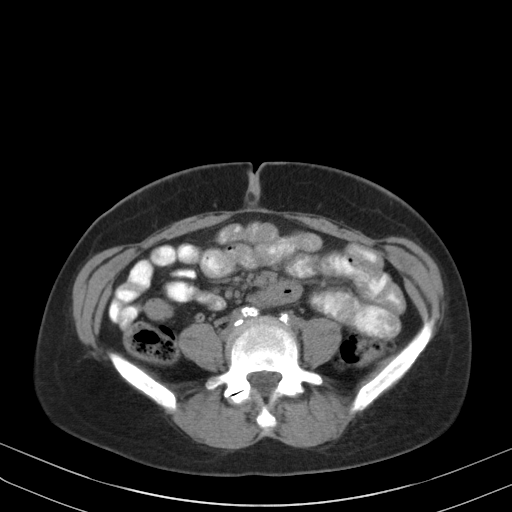
[im 48/82  soft-tissue]
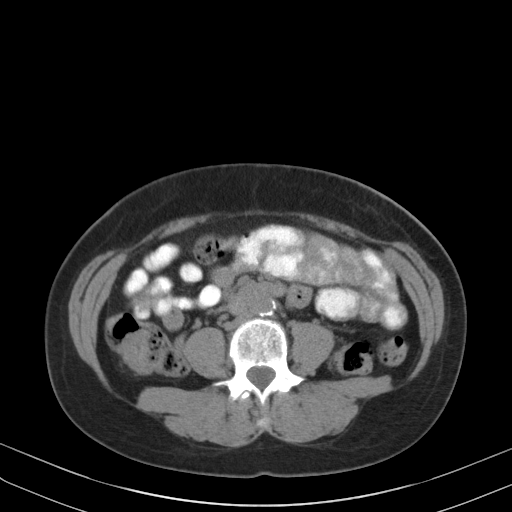
[im 53/82  soft-tissue]
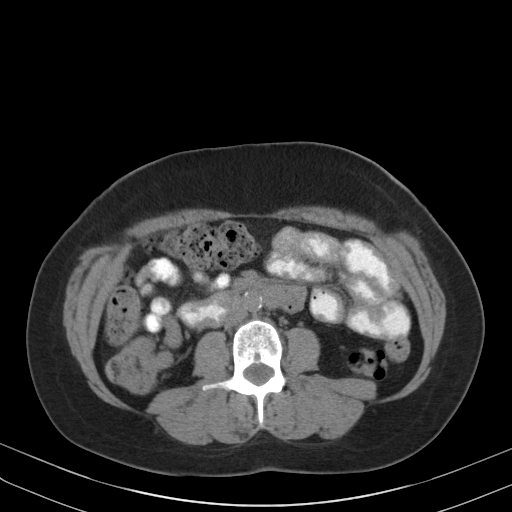
[im 53/82  bone]
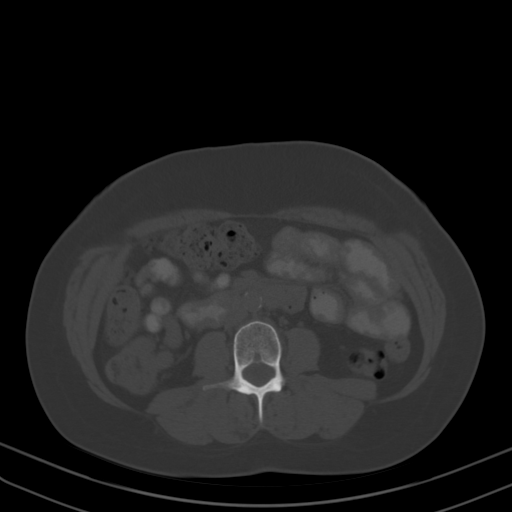
[im 58/82  soft-tissue]
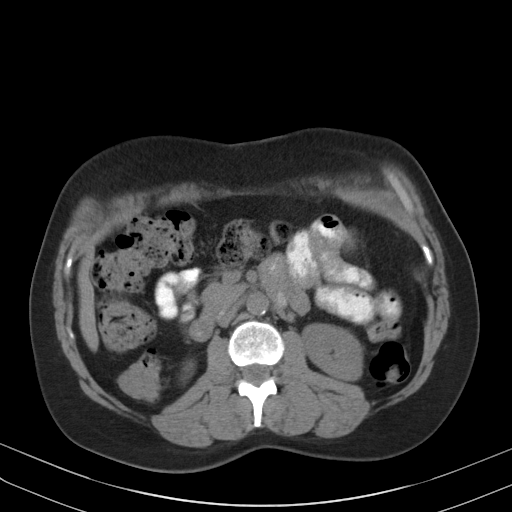
[im 62/82  soft-tissue]
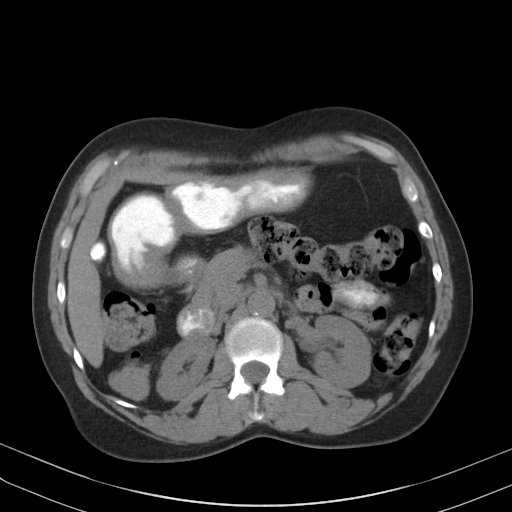
[im 62/82  lung]
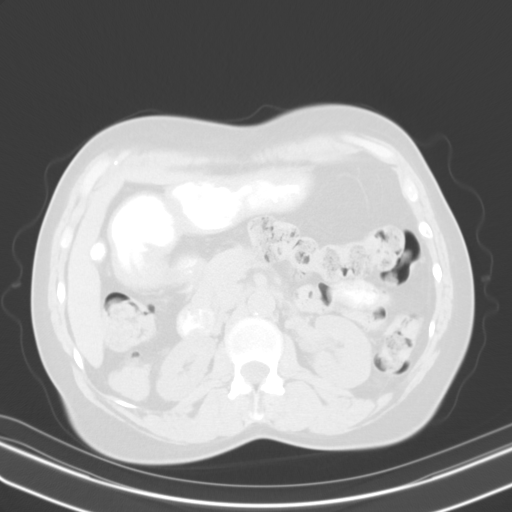
[im 67/82  lung]
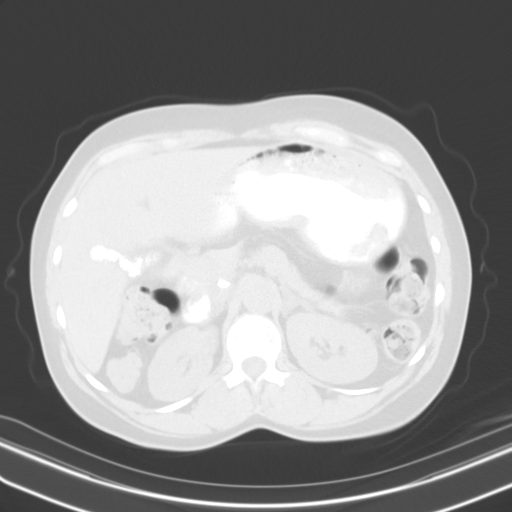
[im 72/82  soft-tissue]
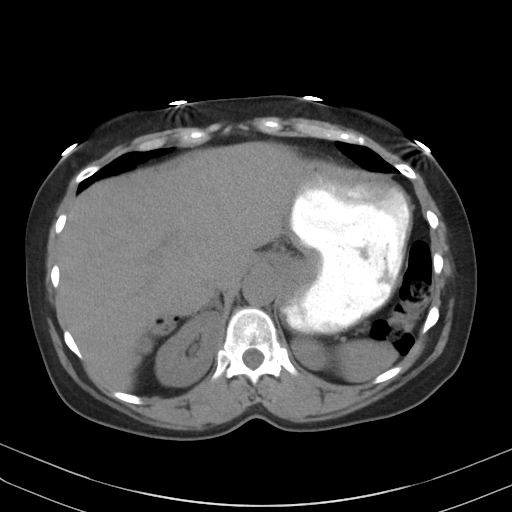
[im 72/82  lung]
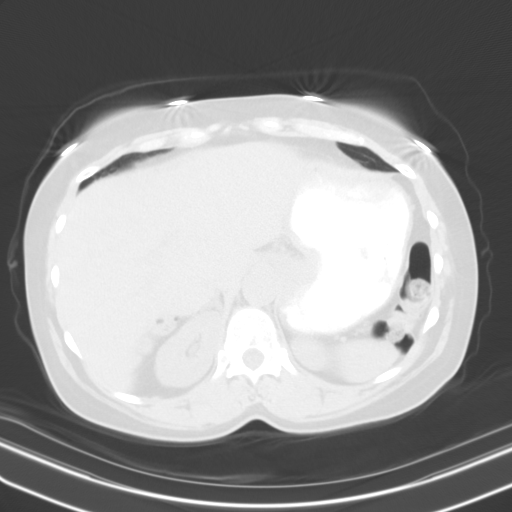
[im 77/82  soft-tissue]
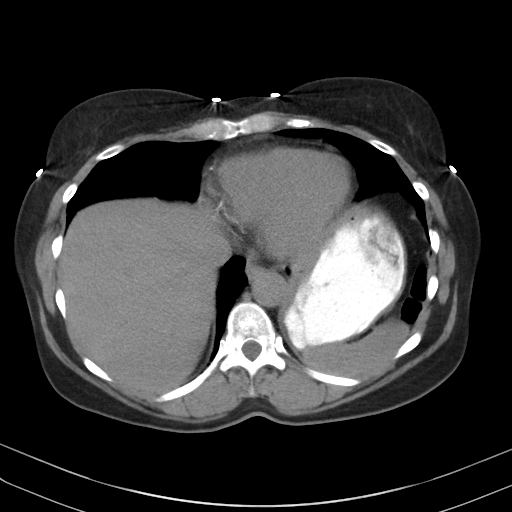
[im 77/82  lung]
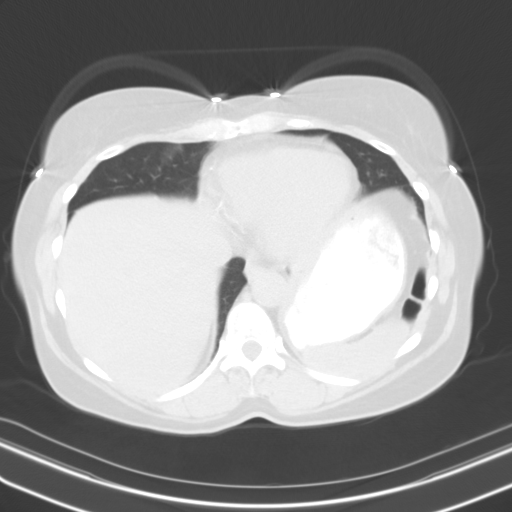

[14 of 32 positions shown; findings below may reference images not displayed]

FINDINGS: Lower chest: No acute findings.

Hepatobiliary: No mass visualized on this unenhanced exam. Multiple
calcified gallstones are seen filling the gallbladder lumen. No
evidence of cholecystitis or biliary ductal dilatation.

Pancreas: No mass or inflammatory process visualized on this
unenhanced exam.

Spleen:  Within normal limits in size.

Adrenals/Urinary tract: No evidence of urolithiasis or
hydronephrosis. Unremarkable unopacified urinary bladder.

Stomach/Bowel: No evidence of obstruction, inflammatory process, or
abnormal fluid collections. Normal appendix visualized. Colonic
diverticulosis is noted, however there is no evidence of
diverticulitis.

Vascular/Lymphatic: No pathologically enlarged lymph nodes
identified. No evidence of abdominal aortic aneurysm. Aortic
atherosclerotic calcification noted.

Reproductive: A few small calcified uterine fibroids are seen
measuring up to 2 cm. Adnexal regions are unremarkable.

Other:  None.

Musculoskeletal:  No suspicious bone lesions identified.
IMPRESSION: No evidence of urolithiasis, hydronephrosis, or other acute
findings.

Cholelithiasis. No radiographic evidence of cholecystitis.

Colonic diverticulosis, without radiographic evidence of
diverticulitis.

Small calcified uterine fibroids.

Aortic Atherosclerosis ([id number]).

## 2023-06-20 ENCOUNTER — Telehealth: Payer: Self-pay | Admitting: *Deleted

## 2023-06-20 NOTE — Telephone Encounter (Signed)
 Patient was identified as falling into the [place] Measure - Diabetes.   Patient was: Appointment scheduled with primary care provider in the next 30 days.

## 2023-06-26 ENCOUNTER — Other Ambulatory Visit: Payer: Self-pay | Admitting: Family Medicine

## 2023-06-26 DIAGNOSIS — E119 Type 2 diabetes mellitus without complications: Secondary | ICD-10-CM

## 2023-07-18 ENCOUNTER — Telehealth: Payer: Self-pay | Admitting: Family Medicine

## 2023-07-18 NOTE — Telephone Encounter (Signed)
 Patient was identified as falling into the [place] Measure - Diabetes.   Patient was: Appointment scheduled with primary care provider in the next 30 days.

## 2023-08-12 ENCOUNTER — Ambulatory Visit: Payer: Medicare HMO | Admitting: Family Medicine

## 2023-08-12 ENCOUNTER — Telehealth: Payer: Self-pay | Admitting: Pharmacist

## 2023-08-12 ENCOUNTER — Encounter: Payer: Self-pay | Admitting: Family Medicine

## 2023-08-12 VITALS — BP 104/80 | HR 72 | Ht 63.0 in | Wt 140.2 lb

## 2023-08-12 DIAGNOSIS — Z0000 Encounter for general adult medical examination without abnormal findings: Secondary | ICD-10-CM | POA: Diagnosis not present

## 2023-08-12 DIAGNOSIS — Z794 Long term (current) use of insulin: Secondary | ICD-10-CM | POA: Diagnosis not present

## 2023-08-12 DIAGNOSIS — I10 Essential (primary) hypertension: Secondary | ICD-10-CM

## 2023-08-12 DIAGNOSIS — K219 Gastro-esophageal reflux disease without esophagitis: Secondary | ICD-10-CM | POA: Diagnosis not present

## 2023-08-12 DIAGNOSIS — F17200 Nicotine dependence, unspecified, uncomplicated: Secondary | ICD-10-CM

## 2023-08-12 DIAGNOSIS — E1100 Type 2 diabetes mellitus with hyperosmolarity without nonketotic hyperglycemic-hyperosmolar coma (NKHHC): Secondary | ICD-10-CM

## 2023-08-12 LAB — POCT GLYCOSYLATED HEMOGLOBIN (HGB A1C): Hemoglobin A1C: 9.6 % — AB (ref 4.0–5.6)

## 2023-08-12 NOTE — Assessment & Plan Note (Signed)
 Unfortunately has not been able to use nicotine replacement.  Can continue to follow readiness to quit at next visit.

## 2023-08-12 NOTE — Patient Instructions (Signed)
 Take your metformin in the morning, jardiance at lunch, and metformin WITH insulin in the evening. Come back in 1 month to see how you are doing. Be sure to also take in electrolytes in drinks to bring the blood pressure up. Be sure to call your eye doctor for your eye exam.

## 2023-08-12 NOTE — Assessment & Plan Note (Signed)
 Updated A1c 9.6 today slightly down from prior at 9.8.  She is continue to have difficulty with taking her insulin.  Advised to take with her evening dose of metformin since she endorses great compliance with this.  Continue morning dose of metformin and Jardiance which are at maximal doses.  Briefly discussed nutrition interventions of including more fibrous vegetables, fruits, and lean meats to help decrease carbohydrate load.  Follow-up in 1 month.

## 2023-08-12 NOTE — Assessment & Plan Note (Signed)
 Now off the losartan with still low normal blood pressures.  Question some element of relative dehydration.  Discussed using sugar-free electrolyte solutions to help stay more hydrated.  Follow-up in 1 month.

## 2023-08-12 NOTE — Telephone Encounter (Signed)
 Patient contacted for potential CGM initiation following elevated A1C and medication noncompliance. Two appointments scheduled 5/19, one with Dr. Fredrik Jensen and another with Dr. Tiahna Cure, for follow-up and further discussion on details for CGM.  Current Medications include: empagliflozin (Jardiance) 25 mg , metformin XR 1000 mg BID, insulin glargine (Lantus) 24 units (misses 2-3 doses per week),   Medication Plan: -no change    Total time with patient call and documentation of interaction: 7 minutes.

## 2023-08-12 NOTE — Assessment & Plan Note (Signed)
 No longer taking famotidine given improved symptoms.  Follow-up as needed.

## 2023-08-12 NOTE — Progress Notes (Signed)
    SUBJECTIVE:   CHIEF COMPLAINT / HPI:   T2DM Here to get A1c rechecked.  At last visit she was instructed to take Lantus every night as well as continue Jardiance and metformin.  We also discussed initiation of GLP-1 at that time.  She is also in need of her urine microalbumin/creatinine ratio. Since this time, she has been forgetting her nightly insulin about 2-3 times per week before bed. She also may have some overeating with rice and carbohydrates. She does not need to talk to a nutritionist since she knows what to eat.  Hypertension Recently taken off of losartan 12.5 mg daily due to orthostasis and low BP at last visit. She continues to have lower BP and has some dizziness with standing up at times.  She knows she does not drink as much water as she should.  Tobacco use Refills of milligram nicotine patches at that time we discussed going down to 8 cigarettes/day.  She has not been using these since her last visit.  GERD Not taking famotidine as much since not having symptoms.  PERTINENT  PMH / PSH: Osteoarthritis, lumbago  OBJECTIVE:   BP 104/80   Pulse 72   Ht 5\' 3"  (1.6 m)   Wt 140 lb 4 oz (63.6 kg)   SpO2 99%   BMI 24.84 kg/m   General: Alert and oriented, in NAD Skin: Warm, dry, and intact without lesions HEENT: NCAT, EOM grossly normal, midline nasal septum Cardiac: RRR, no m/r/g appreciated Respiratory: CTAB, breathing and speaking comfortably on RA Abdominal: Soft, nondistended, normoactive bowel sounds Extremities: Moves all extremities grossly equally Neurological: No gross focal deficit Psychiatric: Appropriate mood and affect  ASSESSMENT/PLAN:   Assessment & Plan Type 2 diabetes mellitus with hyperosmolarity without coma, with long-term current use of insulin (HCC) Updated A1c 9.6 today slightly down from prior at 9.8.  She is continue to have difficulty with taking her insulin.  Advised to take with her evening dose of metformin since she endorses  great compliance with this.  Continue morning dose of metformin and Jardiance which are at maximal doses.  Briefly discussed nutrition interventions of including more fibrous vegetables, fruits, and lean meats to help decrease carbohydrate load.  Follow-up in 1 month. Primary hypertension Now off the losartan with still low normal blood pressures.  Question some element of relative dehydration.  Discussed using sugar-free electrolyte solutions to help stay more hydrated.  Follow-up in 1 month. TOBACCO ABUSE Unfortunately has not been able to use nicotine replacement.  Can continue to follow readiness to quit at next visit. Gastroesophageal reflux disease, unspecified whether esophagitis present No longer taking famotidine given improved symptoms.  Follow-up as needed. Healthcare maintenance Patient politely declined shingles vaccines as well as Tdap vaccine.  Foot exam normal.  She is going to contact her optometrist soon for her diabetic eye exam.   Haley Kenning, MD [facility]

## 2023-08-12 NOTE — Assessment & Plan Note (Signed)
 Patient politely declined shingles vaccines as well as Tdap vaccine.  Foot exam normal.  She is going to contact her optometrist soon for her diabetic eye exam.

## 2023-08-13 ENCOUNTER — Encounter: Payer: Self-pay | Admitting: Family Medicine

## 2023-08-13 LAB — MICROALBUMIN / CREATININE URINE RATIO
Creatinine, Urine: 47.3 mg/dL
Microalb/Creat Ratio: 338 mg/g{creat} — ABNORMAL HIGH (ref 0–29)
Microalbumin, Urine: 159.8 ug/mL

## 2023-08-13 NOTE — Telephone Encounter (Signed)
 Reviewed and agree with Dr Macky Lower plan.

## 2023-09-16 ENCOUNTER — Ambulatory Visit: Admitting: Pharmacist

## 2023-09-16 ENCOUNTER — Ambulatory Visit: Payer: Self-pay | Admitting: Family Medicine

## 2023-09-16 ENCOUNTER — Other Ambulatory Visit: Payer: Self-pay

## 2023-09-20 ENCOUNTER — Other Ambulatory Visit: Payer: Self-pay

## 2023-09-25 ENCOUNTER — Other Ambulatory Visit: Payer: Self-pay

## 2023-09-25 DIAGNOSIS — E785 Hyperlipidemia, unspecified: Secondary | ICD-10-CM

## 2023-09-25 MED ORDER — ROSUVASTATIN CALCIUM 40 MG PO TABS: 40.0000 mg | ORAL_TABLET | Freq: Every day | ORAL | 3 refills | Status: DC

## 2023-10-01 ENCOUNTER — Other Ambulatory Visit: Payer: Self-pay | Admitting: Family Medicine

## 2023-10-01 DIAGNOSIS — E119 Type 2 diabetes mellitus without complications: Secondary | ICD-10-CM

## 2023-10-01 DIAGNOSIS — E785 Hyperlipidemia, unspecified: Secondary | ICD-10-CM

## 2023-10-08 ENCOUNTER — Other Ambulatory Visit: Payer: Self-pay | Admitting: Family Medicine

## 2023-10-08 ENCOUNTER — Telehealth: Payer: Self-pay

## 2023-10-08 DIAGNOSIS — Z122 Encounter for screening for malignant neoplasm of respiratory organs: Secondary | ICD-10-CM

## 2023-10-08 DIAGNOSIS — E785 Hyperlipidemia, unspecified: Secondary | ICD-10-CM

## 2023-10-08 DIAGNOSIS — Z13820 Encounter for screening for osteoporosis: Secondary | ICD-10-CM

## 2023-10-08 NOTE — Telephone Encounter (Signed)
 Called [facility] and spoke with Pansy to schedule a CT and Bone Density scans appointment for patient. Pansy stated that she is unable to schedule due to current order will expire in 2 days. Stated that Dr. Fredrik Jensen will have to place a new order.  Informed Dr. Fredrik Jensen, and Dr. Fredrik Jensen extended the order.  Called [facility] and spoke with Gurney Lefort to try to schedule CT and bone density scans. Gurney Lefort was unable to due so due to order still showing expiring in two days, leslie also informed me that they [facility]) no longer do bone density scans.  Informed Dr. Fredrik Jensen of the situation.  Christ Courier, CMA

## 2023-10-08 NOTE — Progress Notes (Signed)
 Re-ordered DEXA and LDCT scans given patient unable to get them previously. CMA to schedule.

## 2023-11-20 ENCOUNTER — Other Ambulatory Visit: Payer: Self-pay | Admitting: Family Medicine

## 2023-11-20 DIAGNOSIS — Z1231 Encounter for screening mammogram for malignant neoplasm of breast: Secondary | ICD-10-CM

## 2023-11-26 ENCOUNTER — Ambulatory Visit
Admission: RE | Admit: 2023-11-26 | Discharge: 2023-11-26 | Disposition: A | Discharge status: Home / Self Care | Source: Ambulatory Visit | Attending: Family Medicine

## 2023-11-26 DIAGNOSIS — Z1231 Encounter for screening mammogram for malignant neoplasm of breast: Secondary | ICD-10-CM

## 2023-11-26 NOTE — Progress Notes (Shared)
 [facility] - Clinic Note  11/28/2023     CHIEF COMPLAINT Patient presents for No chief complaint on file.   HISTORY OF PRESENT ILLNESS: Haley Mendez is a 68 y.o. female who presents to the clinic today for:    Pt has been lost to follow up since 2019, her last A1c was 7.9 in April, she states she is on metformin , lantus  and jardiance , she states she is not having any problems, just wanted an eye exam since she is diabetic  Referring physician: [name], MD [address] [place],  [place] [address]  HISTORICAL INFORMATION:   Selected notes from the MEDICAL RECORD NUMBER Referred by A. Lundquist, PA for concern of supeiror lattice degeneration OU and retinal hole OU LEE: 08.13.19 (A. Lundquist) [BCVA OD: 20/20 OS: 20/20] Ocular Hx-melanosis, cataract OU, DES OU PMH-DM (taking Metformin , Jardiance , glipizide , last A1C - 7.6), HTN, high cholesterol, arthritis    CURRENT MEDICATIONS: No current outpatient medications on file. (Ophthalmic Drugs)   No current facility-administered medications for this visit. (Ophthalmic Drugs)   Current Outpatient Medications (Other)  Medication Sig   blood glucose meter kit and supplies Dispense based on patient and insurance preference. Use up to four times daily as directed. (FOR ICD-10 E10.9, E11.9).   famotidine  (PEPCID ) 20 MG tablet Take 2 tabs daily for two weeks, then take 1 tab daily for one week.  Then take as needed. (Patient not taking: Reported on 03/01/2022)   glucose blood (ACCU-CHEK GUIDE) test strip Use to test blood sugars 3x per day E11.9   Insulin  Pen Needle (TRUEPLUS PEN NEEDLES) 32G X 4 MM MISC Use as directed to administer insulin  daily   JARDIANCE  25 MG TABS tablet Take 1 tablet by mouth once daily   LANTUS  SOLOSTAR 100 UNIT/ML Solostar Pen INJECT 24 UNITS SUBCUTANEOUSLY AT BEDTIME   metFORMIN  (GLUCOPHAGE -XR) 500 MG 24 hr tablet TAKE 2 TABLETS BY MOUTH TWICE DAILY WITH FOOD   nicotine  (NICODERM CQ  -  DOSED IN MG/24 HR) 7 mg/24hr patch Place 1 patch (7 mg total) onto the skin daily. (Patient not taking: Reported on 08/12/2023)   rosuvastatin  (CRESTOR ) 40 MG tablet Take 1 tablet (40 mg total) by mouth daily.   TRUEplus Lancets 28G MISC 1 each by Does not apply route 3 (three) times daily before meals.   No current facility-administered medications for this visit. (Other)   REVIEW OF SYSTEMS:   ALLERGIES Allergies  Allergen Reactions   Acidophilus    PAST MEDICAL HISTORY Past Medical History:  Diagnosis Date   Arthritis    Bone spur of other site    left rotator cuff   Diabetes mellitus without complication (HCC)    Healthcare maintenance 01/30/2020   History of colon polyps    Hyperlipidemia    Hypertension    Sinus congestion 04/26/2023   Toenail fungus 04/10/2012   No past surgical history on file.  FAMILY HISTORY Family History  Problem Relation Age of Onset   Early death Mother    Diabetes Father    Hypertension Father    Diabetes Brother    Diabetes Brother    Amblyopia Neg Hx    Blindness Neg Hx    Cataracts Neg Hx    Glaucoma Neg Hx    Macular degeneration Neg Hx    Retinal detachment Neg Hx    Strabismus Neg Hx    Retinitis pigmentosa Neg Hx    SOCIAL HISTORY Social History   Tobacco Use  Smoking status: Every Day    Current packs/day: 0.50    Average packs/day: 0.5 packs/day for 15.0 years (7.5 ttl pk-yrs)    Types: Cigarettes   Smokeless tobacco: Never  Vaping Use   Vaping status: Never Used  Substance Use Topics   Alcohol use: Yes    Alcohol/week: 2.0 standard drinks of alcohol    Types: 2 Cans of beer per week    Comment: occasional   Drug use: No       OPHTHALMIC EXAM:   Not recorded    IMAGING AND PROCEDURES  Imaging and Procedures for @TODAY @           ASSESSMENT/PLAN:    ICD-10-CM   1. Bilateral retinal lattice degeneration  H35.413     2. Retinal hole of both eyes  H33.323     3. Diabetes mellitus type 2  without retinopathy (HCC)  E11.9     4. Essential hypertension  I10     5. Hypertensive retinopathy of both eyes  H35.033     6. Combined forms of age-related cataract of both eyes  H25.813       1,2. Lattice degeneration w/ atrophic holes, OU - OD: pigmented patches from 1100 to 1200 with atrophic holes, patch at 0730, and 1030 - OS: pigmented lattice at 1200, 0600, 0730, 1030 equator; round atrophic hole at 0100 - s/p laser retinopexy OD (08.15.19), S/P touch up laser retinopexy OD (08.29.19) -- good laser changes around all lesions OD - s/p laser retinopexy OS (09.12.19) -- good laser changes in place - no new RT/RD or lattice OU - f/u 1 year, DFE, OCT  3.Diabetes mellitus, type 2 without retinopathy - The incidence, risk factors for progression, natural history and treatment options for diabetic retinopathy  were discussed with patient.   - The need for close monitoring of blood glucose, blood pressure, and serum lipids, avoiding cigarette or any type of tobacco, and the need for long term follow up was also discussed with patient. - f/u in 1 year, sooner prn  4,5. Hypertensive retinopathy OU - discussed importance of tight BP control - monitor  6. Combined form age related cataract OU-  - The symptoms of cataract, surgical options, and treatments and risks were discussed with patient. - discussed diagnosis and progression - monitor  Ophthalmic Meds Ordered this visit:  No orders of the defined types were placed in this encounter.    No follow-ups on file.  There are no Patient Instructions on file for this visit.   Explained the diagnoses, plan, and follow up with the patient and they expressed understanding.  Patient expressed understanding of the importance of proper follow up care.   This document serves as a record of services personally performed by Redell JUDITHANN Hans, MD, PhD. It was created on their behalf by Alan PARAS. Delores, OA an ophthalmic technician. The  creation of this record is the provider's dictation and/or activities during the visit.    Electronically signed by: Alan PARAS. Delores BIHARI 11/26/23 12:37 PM.3  Redell JUDITHANN Hans, M.D., Ph.D. Diseases & Surgery of the Retina and Vitreous [facility]    Abbreviations: M myopia (nearsighted); A astigmatism; H hyperopia (farsighted); P presbyopia; Mrx spectacle prescription;  CTL contact lenses; OD right eye; OS left eye; OU both eyes  XT exotropia; ET esotropia; PEK punctate epithelial keratitis; PEE punctate epithelial erosions; DES dry eye syndrome; MGD meibomian gland dysfunction; ATs artificial tears; PFAT's preservative free artificial tears; NSC  nuclear sclerotic cataract; PSC posterior subcapsular cataract; ERM epi-retinal membrane; PVD posterior vitreous detachment; RD retinal detachment; DM diabetes mellitus; DR diabetic retinopathy; NPDR non-proliferative diabetic retinopathy; PDR proliferative diabetic retinopathy; CSME clinically significant macular edema; DME diabetic macular edema; dbh dot blot hemorrhages; CWS cotton wool spot; POAG primary open angle glaucoma; C/D cup-to-disc ratio; HVF humphrey visual field; GVF goldmann visual field; OCT optical coherence tomography; IOP intraocular pressure; BRVO Branch retinal vein occlusion; CRVO central retinal vein occlusion; CRAO central retinal artery occlusion; BRAO branch retinal artery occlusion; RT retinal tear; SB scleral buckle; PPV pars plana vitrectomy; VH Vitreous hemorrhage; PRP panretinal laser photocoagulation; IVK intravitreal kenalog; VMT vitreomacular traction; MH Macular hole;  NVD neovascularization of the disc; NVE neovascularization elsewhere; AREDS age related eye disease study; ARMD age related macular degeneration; POAG primary open angle glaucoma; EBMD epithelial/anterior basement membrane dystrophy; ACIOL anterior chamber intraocular lens; IOL intraocular lens; PCIOL posterior chamber intraocular lens;  Phaco/IOL phacoemulsification with intraocular lens placement; PRK photorefractive keratectomy; LASIK laser assisted in situ keratomileusis; HTN hypertension; DM diabetes mellitus; COPD chronic obstructive pulmonary disease

## 2023-11-28 ENCOUNTER — Encounter (INDEPENDENT_AMBULATORY_CARE_PROVIDER_SITE_OTHER): Admitting: Ophthalmology

## 2023-11-28 DIAGNOSIS — I10 Essential (primary) hypertension: Secondary | ICD-10-CM

## 2023-11-28 DIAGNOSIS — H35413 Lattice degeneration of retina, bilateral: Secondary | ICD-10-CM

## 2023-11-28 DIAGNOSIS — H25813 Combined forms of age-related cataract, bilateral: Secondary | ICD-10-CM

## 2023-11-28 DIAGNOSIS — H33323 Round hole, bilateral: Secondary | ICD-10-CM

## 2023-11-28 DIAGNOSIS — H35033 Hypertensive retinopathy, bilateral: Secondary | ICD-10-CM

## 2023-11-28 DIAGNOSIS — E119 Type 2 diabetes mellitus without complications: Secondary | ICD-10-CM

## 2023-12-02 NOTE — Progress Notes (Signed)
 [facility] - Clinic Note  12/04/2023     CHIEF COMPLAINT Patient presents for Retina Evaluation   HISTORY OF PRESENT ILLNESS: Haley Mendez is a 68 y.o. female who presents to the clinic today for:   HPI     Retina Evaluation   In both eyes.  This started 6 years ago.  Duration of 2 years.  Associated Symptoms Floaters.  Negative for Flashes, Distortion, Blind Spot, Pain, Redness, Photophobia, Glare, Trauma, Scalp Tenderness, Jaw Claudication, Shoulder/Hip pain, Fever, Weight Loss and Fatigue.  Treatments tried include artificial tears.  I, the attending physician,  performed the HPI with the patient and updated documentation appropriately.        Comments   Patient states vision has been fine, she notices floaters in right eye every now and then but not changing. Pt denies FOL/pain. Pt uses clear eyes or visine PRN. A1C=9.6, 07/2023 BS- not monitored      Last edited by Haley Rogue, MD on 12/10/2023  2:40 AM.     Pt states VA is normal but she does see a FOL in OD every once in a while. She reports not taking her insulin  as she should, every now and then-last shot of insulin  is over a month ago.  Referring physician: [name], MD [address] [place],  [place] [address]  HISTORICAL INFORMATION:   Selected notes from the MEDICAL RECORD NUMBER Referred by A. Lundquist, PA for concern of supeiror lattice degeneration OU and retinal hole OU LEE: 08.13.19 (Haley Mendez) [BCVA OD: 20/20 OS: 20/20] Ocular Hx-melanosis, cataract OU, DES OU PMH-DM (taking Metformin , Jardiance , glipizide , last A1C - 7.6), HTN, high cholesterol, arthritis    CURRENT MEDICATIONS: No current outpatient medications on file. (Ophthalmic Drugs)   No current facility-administered medications for this visit. (Ophthalmic Drugs)   Current Outpatient Medications (Other)  Medication Sig   BD PEN NEEDLE NANO 2ND GEN 32G X 4 MM MISC USE AS DIRECTED TO  ADMINISTER  INSULIN   DAILY    blood glucose meter kit and supplies Dispense based on patient and insurance preference. Use up to four times daily as directed. (FOR ICD-10 E10.9, E11.9).   Blood Glucose Monitoring Suppl DEVI 1 each by Does not apply route in the morning, at noon, and at bedtime. May substitute to any manufacturer covered by patient's insurance.   famotidine  (PEPCID ) 20 MG tablet Take 2 tabs daily for two weeks, then take 1 tab daily for one week.  Then take as needed. (Patient not taking: Reported on 03/01/2022)   Glucose Blood (BLOOD GLUCOSE TEST STRIPS) STRP 1 each by In Vitro route in the morning, at noon, and at bedtime. May substitute to any manufacturer covered by patient's insurance.   JARDIANCE  25 MG TABS tablet Take 1 tablet by mouth once daily   Lancet Device MISC 1 each by Does not apply route in the morning, at noon, and at bedtime. May substitute to any manufacturer covered by patient's insurance.   Lancets Misc. MISC 1 each by Does not apply route in the morning, at noon, and at bedtime. May substitute to any manufacturer covered by patient's insurance.   LANTUS  SOLOSTAR 100 UNIT/ML Solostar Pen Inject 10 Units into the skin daily.   metFORMIN  (GLUCOPHAGE -XR) 500 MG 24 hr tablet TAKE 2 TABLETS BY MOUTH TWICE DAILY WITH FOOD   nicotine  (NICODERM CQ  - DOSED IN MG/24 HR) 7 mg/24hr patch Place 1 patch (7 mg total) onto the skin daily. (Patient not taking:  Reported on 08/12/2023)   rosuvastatin  (CRESTOR ) 40 MG tablet Take 1 tablet (40 mg total) by mouth daily.   TRUEplus Lancets 28G MISC 1 each by Does not apply route 3 (three) times daily before meals.   No current facility-administered medications for this visit. (Other)   REVIEW OF SYSTEMS: ROS   Positive for: Endocrine, Eyes Negative for: Constitutional, Gastrointestinal, Neurological, Skin, Genitourinary, Musculoskeletal, HENT, Cardiovascular, Respiratory, Psychiatric, Allergic/Imm, Heme/Lymph Last edited by Haley Mendez, COT on 12/04/2023   8:14 AM.      ALLERGIES Allergies  Allergen Reactions   Acidophilus    PAST MEDICAL HISTORY Past Medical History:  Diagnosis Date   Arthritis    Bone spur of other site    left rotator cuff   Diabetes mellitus without complication (HCC)    Healthcare maintenance 01/30/2020   History of colon polyps    Hyperlipidemia    Hypertension    Sinus congestion 04/26/2023   Toenail fungus 04/10/2012   Past Surgical History:  Procedure Laterality Date   CATARACT EXTRACTION      FAMILY HISTORY Family History  Problem Relation Age of Onset   Early death Mother    Diabetes Father    Hypertension Father    Diabetes Brother    Diabetes Brother    Amblyopia Neg Hx    Blindness Neg Hx    Cataracts Neg Hx    Glaucoma Neg Hx    Macular degeneration Neg Hx    Retinal detachment Neg Hx    Strabismus Neg Hx    Retinitis pigmentosa Neg Hx    SOCIAL HISTORY Social History   Tobacco Use   Smoking status: Every Day    Current packs/day: 0.50    Average packs/day: 0.5 packs/day for 15.0 years (7.5 ttl pk-yrs)    Types: Cigarettes   Smokeless tobacco: Never  Vaping Use   Vaping status: Never Used  Substance Use Topics   Alcohol use: Yes    Alcohol/week: 2.0 standard drinks of alcohol    Types: 2 Cans of beer per week    Comment: occasional   Drug use: No       OPHTHALMIC EXAM:   Base Eye Exam     Visual Acuity (Snellen - Linear)       Right Left   Dist [place] 20/20 -2 20/20 -1         Tonometry (Tonopen, 8:12 AM)       Right Left   Pressure 22 24         Pupils       Pupils Dark Light Shape React APD   Right PERRL 3 2 Round Brisk None   Left PERRL 3 2 Round Brisk None         Visual Fields       Left Right    Full Full         Extraocular Movement       Right Left    Full, Ortho Full, Ortho         Neuro/Psych     Oriented x3: Yes   Mood/Affect: Normal         Dilation     Both eyes: 1.0% Mydriacyl, 2.5% Phenylephrine @ 8:13 AM            Slit Lamp and Fundus Exam     Slit Lamp Exam       Right Left   Lids/Lashes Dermatochalasis - upper lid Dermatochalasis - upper lid, Meibomian gland  dysfunction   Conjunctiva/Sclera Melanosis Melanosis   Cornea Mild Arcus, tear film debris Arcus, trace tear film debris   Anterior Chamber deep, clear, narrow temporal angle deep, clear, narrow temporal angle   Iris Round and moderately dilated to 6mm, no NVI Round and moderately dilated to 6mm, no NVI   Lens 2-3+ Nuclear sclerosis, 2-3+ Cortical cataract 2-3+ Nuclear sclerosis, 2-3+ Cortical cataract   Anterior Vitreous mild syneresis Vitreous syneresis         Fundus Exam       Right Left   Disc Pink and Sharp, mild PPP Pink and Sharp, PPP   C/D Ratio 0.5 0.6   Macula Flat, good foveal reflex, No heme or edema Flat, good foveal reflex, Retinal pigment epithelial mottling, No heme or edema   Vessels attenuated, mild copper wiring, mild tortuosity, mild AV crossing changes attenuated, mild copper wiring, mild tortuosity, mild AV crossing changes   Periphery Attached, pigmented Lattice degeneration from 1100 to 1200 with atrophic holes; pigmented lattice with atrophic hole and overlying vitreous syneresis at 0730 and 1030 -- good laser surrounding all lesions; no heme, no new RT/RD/lattice Attached, pigmented Lattice degeneration at 1200, 0430, 0600, 0730, and 1030 equator -- good laser changes surrounding all lesions, No heme; no new RT/RD or lattice           IMAGING AND PROCEDURES  Imaging and Procedures for @TODAY @  OCT, Retina - OU - Both Eyes       Right Eye Quality was good. Central Foveal Thickness: 264. Progression has been stable. Findings include normal foveal contour, no IRF, no SRF, vitreomacular adhesion .   Left Eye Quality was good. Central Foveal Thickness: 253. Progression has been stable. Findings include normal foveal contour, no IRF, no SRF, vitreomacular adhesion (Partial PVD).    Notes *Images captured and stored on drive  Diagnosis / Impression:  NFP, No IRF/SRF OU No DME  Clinical management:  See below  Abbreviations: NFP - Normal foveal profile. CME - cystoid macular edema. PED - pigment epithelial detachment. IRF - intraretinal fluid. SRF - subretinal fluid. EZ - ellipsoid zone. ERM - epiretinal membrane. ORA - outer retinal atrophy. ORT - outer retinal tubulation. SRHM - subretinal hyper-reflective material               ASSESSMENT/PLAN:    ICD-10-CM   1. Bilateral retinal lattice degeneration  H35.413 OCT, Retina - OU - Both Eyes    2. Retinal hole of both eyes  H33.323     3. Diabetes mellitus type 2 without retinopathy (HCC)  E11.9 OCT, Retina - OU - Both Eyes    4. Diabetes mellitus treated with insulin  and oral medication (HCC)  E11.9    Z79.4    Z79.84     5. Essential hypertension  I10     6. Hypertensive retinopathy of both eyes  H35.033     7. Combined forms of age-related cataract of both eyes  H25.813      1,2. Lattice degeneration w/ atrophic holes, OU - OD: pigmented patches from 1100 to 1200 with atrophic holes, patch at 0730, and 1030 - OS: pigmented lattice at 1200, 0600, 0730, 1030 equator; round atrophic hole at 0100 - s/p laser retinopexy OD (08.15.19), S/P touch up laser retinopexy OD (08.29.19) -- good laser changes around all lesions OD - s/p laser retinopexy OS (09.12.19) -- good laser changes in place - no new RT/RD or lattice OU - f/u 1 year, DFE, OCT  3.Diabetes mellitus,  type 2 without retinopathy  - A1C 9.6 (04.14.25) - The incidence, risk factors for progression, natural history and treatment options for diabetic retinopathy  were discussed with patient.   - The need for close monitoring of blood glucose, blood pressure, and serum lipids, avoiding cigarette or any type of tobacco, and the need for long term follow up was also discussed with patient. - f/u in 1 year, sooner prn  4,5. Hypertensive  retinopathy OU - discussed importance of tight BP control - monitor  6. Combined form age related cataract OU-  - The symptoms of cataract, surgical options, and treatments and risks were discussed with patient. - discussed diagnosis and progression - monitor  Ophthalmic Meds Ordered this visit:  No orders of the defined types were placed in this encounter.    Return in about 1 year (around 12/03/2024) for lattice degen OU, DFE, OCT.  There are no Patient Instructions on file for this visit.   Explained the diagnoses, plan, and follow up with the patient and they expressed understanding.  Patient expressed understanding of the importance of proper follow up care.   This document serves as a record of services personally performed by Redell JUDITHANN Hans, MD, PhD. It was created on their behalf by Almetta Pesa, an ophthalmic technician. The creation of this record is the provider's dictation and/or activities during the visit.    Electronically signed by: Almetta Pesa, OA, 12/10/23  2:41 AM  Redell JUDITHANN Hans, M.D., Ph.D. Diseases & Surgery of the Retina and Vitreous [facility]  I have reviewed the above documentation for accuracy and completeness, and I agree with the above. Redell JUDITHANN Hans, M.D., Ph.D. 12/10/23 2:41 AM   Abbreviations: M myopia (nearsighted); A astigmatism; H hyperopia (farsighted); P presbyopia; Mrx spectacle prescription;  CTL contact lenses; OD right eye; OS left eye; OU both eyes  XT exotropia; ET esotropia; PEK punctate epithelial keratitis; PEE punctate epithelial erosions; DES dry eye syndrome; MGD meibomian gland dysfunction; ATs artificial tears; PFAT's preservative free artificial tears; NSC nuclear sclerotic cataract; PSC posterior subcapsular cataract; ERM epi-retinal membrane; PVD posterior vitreous detachment; RD retinal detachment; DM diabetes mellitus; DR diabetic retinopathy; NPDR non-proliferative diabetic retinopathy; PDR  proliferative diabetic retinopathy; CSME clinically significant macular edema; DME diabetic macular edema; dbh dot blot hemorrhages; CWS cotton wool spot; POAG primary open angle glaucoma; C/D cup-to-disc ratio; HVF humphrey visual field; GVF goldmann visual field; OCT optical coherence tomography; IOP intraocular pressure; BRVO Branch retinal vein occlusion; CRVO central retinal vein occlusion; CRAO central retinal artery occlusion; BRAO branch retinal artery occlusion; RT retinal tear; SB scleral buckle; PPV pars plana vitrectomy; VH Vitreous hemorrhage; PRP panretinal laser photocoagulation; IVK intravitreal kenalog; VMT vitreomacular traction; MH Macular hole;  NVD neovascularization of the disc; NVE neovascularization elsewhere; AREDS age related eye disease study; ARMD age related macular degeneration; POAG primary open angle glaucoma; EBMD epithelial/anterior basement membrane dystrophy; ACIOL anterior chamber intraocular lens; IOL intraocular lens; PCIOL posterior chamber intraocular lens; Phaco/IOL phacoemulsification with intraocular lens placement; PRK photorefractive keratectomy; LASIK laser assisted in situ keratomileusis; HTN hypertension; DM diabetes mellitus; COPD chronic obstructive pulmonary disease

## 2023-12-04 ENCOUNTER — Ambulatory Visit (INDEPENDENT_AMBULATORY_CARE_PROVIDER_SITE_OTHER): Admitting: Ophthalmology

## 2023-12-04 ENCOUNTER — Encounter (INDEPENDENT_AMBULATORY_CARE_PROVIDER_SITE_OTHER): Payer: Self-pay | Admitting: Ophthalmology

## 2023-12-04 ENCOUNTER — Encounter: Payer: Self-pay | Admitting: Family Medicine

## 2023-12-04 DIAGNOSIS — H33323 Round hole, bilateral: Secondary | ICD-10-CM | POA: Diagnosis not present

## 2023-12-04 DIAGNOSIS — I10 Essential (primary) hypertension: Secondary | ICD-10-CM | POA: Diagnosis not present

## 2023-12-04 DIAGNOSIS — Z794 Long term (current) use of insulin: Secondary | ICD-10-CM

## 2023-12-04 DIAGNOSIS — H35413 Lattice degeneration of retina, bilateral: Secondary | ICD-10-CM

## 2023-12-04 DIAGNOSIS — H25813 Combined forms of age-related cataract, bilateral: Secondary | ICD-10-CM

## 2023-12-04 DIAGNOSIS — Z7984 Long term (current) use of oral hypoglycemic drugs: Secondary | ICD-10-CM

## 2023-12-04 DIAGNOSIS — E119 Type 2 diabetes mellitus without complications: Secondary | ICD-10-CM

## 2023-12-04 DIAGNOSIS — H35033 Hypertensive retinopathy, bilateral: Secondary | ICD-10-CM

## 2023-12-04 LAB — HM DIABETES EYE EXAM

## 2023-12-06 ENCOUNTER — Other Ambulatory Visit: Payer: Self-pay | Admitting: Family Medicine

## 2023-12-06 ENCOUNTER — Ambulatory Visit (INDEPENDENT_AMBULATORY_CARE_PROVIDER_SITE_OTHER): Admitting: Family Medicine

## 2023-12-06 DIAGNOSIS — E119 Type 2 diabetes mellitus without complications: Secondary | ICD-10-CM | POA: Diagnosis not present

## 2023-12-06 DIAGNOSIS — E1100 Type 2 diabetes mellitus with hyperosmolarity without nonketotic hyperglycemic-hyperosmolar coma (NKHHC): Secondary | ICD-10-CM

## 2023-12-06 DIAGNOSIS — Z794 Long term (current) use of insulin: Secondary | ICD-10-CM

## 2023-12-06 LAB — POCT GLYCOSYLATED HEMOGLOBIN (HGB A1C): HbA1c, POC (controlled diabetic range): 11.1 % — AB (ref 0.0–7.0)

## 2023-12-06 MED ORDER — BLOOD GLUCOSE TEST VI STRP: 1.0000 | ORAL_STRIP | Freq: Three times a day (TID) | 0 refills | Status: AC

## 2023-12-06 MED ORDER — BLOOD GLUCOSE MONITORING SUPPL DEVI: 1.0000 | Freq: Three times a day (TID) | 0 refills | Status: AC

## 2023-12-06 MED ORDER — LANCET DEVICE MISC: 1.0000 | Freq: Three times a day (TID) | 0 refills | Status: AC

## 2023-12-06 MED ORDER — LANCETS MISC. MISC: 1.0000 | Freq: Three times a day (TID) | 0 refills | Status: AC

## 2023-12-06 NOTE — Patient Instructions (Signed)
 Restart insulin  at 10 units tomorrow morning. Then eat breakfast.  The next morning, check your sugar, and if >150, add 1 unit to your total daily insulin  amount.  Come back in 2 weeks to see how far you have gone up on lantus . Let me know if you have any side effects or questions before then.

## 2023-12-06 NOTE — Progress Notes (Signed)
    SUBJECTIVE:   CHIEF COMPLAINT / HPI:   T2DM She has been drinking sodas and sweets. She has not been taking the insulin  for the last 2 months because she feels it could be making her lose weight. She has been taking her jardiance  and metformin . She has been taking her Crestor , as well.  PERTINENT  PMH / PSH: Hypertension, GERD, arthritis, HLD  OBJECTIVE:   Unfortunately, CMA unable to put vitals in the chart.  General: Alert and oriented, in NAD Skin: Warm, dry, and intact HEENT: NCAT, EOM grossly normal, midline nasal septum Cardiac: RRR, no m/r/g appreciated Respiratory: CTAB, breathing and speaking comfortably on RA Abdominal: Soft, nontender, nondistended, normoactive bowel sounds Extremities: Moves all extremities grossly equally Neurological: No gross focal deficit Psychiatric: Appropriate mood and affect   ASSESSMENT/PLAN:   Assessment & Plan Type 2 diabetes mellitus with hyperosmolarity without coma, with long-term current use of insulin  (HCC) A1c remains uncontrolled today at 11.1, up from 9.6 three months ago in the setting of dietary indiscretions and difficulty with her Lantus  regimen.  We discussed restarting Lantus  after shared decision making at 10 units tomorrow morning.  She will go up by 1 unit every morning as long as her sugars are greater than 150 before breakfast.  I have sent in a new blood glucose meter along with strips and lancets.  I will also send a message to Dr. Koval to get her enrolled in the LIBERATE study, if they are still enrolling, to help her get a CGM.  I have also sent a message to our health maintenance pool to get her ophthalmology results for a diabetic eye exam.  We will update her BMP and lipid panel today.  She will follow-up in 2 weeks to further discuss her diabetic regimen; she will let me know if she has any side effects or questions before then.  Stuart Redo, MD [facility]

## 2023-12-07 ENCOUNTER — Ambulatory Visit: Payer: Self-pay | Admitting: Family Medicine

## 2023-12-07 LAB — BASIC METABOLIC PANEL WITH GFR
BUN/Creatinine Ratio: 17 (ref 12–28)
BUN: 14 mg/dL (ref 8–27)
CO2: 16 mmol/L — ABNORMAL LOW (ref 20–29)
Calcium: 9.6 mg/dL (ref 8.7–10.3)
Chloride: 108 mmol/L — ABNORMAL HIGH (ref 96–106)
Creatinine, Ser: 0.84 mg/dL (ref 0.57–1.00)
Glucose: 143 mg/dL — ABNORMAL HIGH (ref 70–99)
Potassium: 4.2 mmol/L (ref 3.5–5.2)
Sodium: 142 mmol/L (ref 134–144)
eGFR: 75 mL/min/1.73 (ref >59)

## 2023-12-07 LAB — LIPID PANEL
Chol/HDL Ratio: 3 ratio (ref 0.0–4.4)
Cholesterol, Total: 102 mg/dL (ref 100–199)
HDL: 34 mg/dL — ABNORMAL LOW (ref >39)
LDL Chol Calc (NIH): 46 mg/dL (ref 0–99)
Triglycerides: 122 mg/dL (ref 0–149)
VLDL Cholesterol Cal: 22 mg/dL (ref 5–40)

## 2023-12-07 MED ORDER — LANTUS SOLOSTAR 100 UNIT/ML ~~LOC~~ SOPN: 10.0000 [IU] | PEN_INJECTOR | Freq: Every day | SUBCUTANEOUS | 2 refills | Status: DC

## 2023-12-07 NOTE — Addendum Note (Signed)
 Addended by: [name] B on: 12/07/2023 10:27 AM   Modules accepted: Orders

## 2023-12-07 NOTE — Progress Notes (Signed)
 Noted BMP with glucose 143, CO2 16, and AG 18. Patient is on jardiance . Discussed this with attending Dr. Donah. Called patient today to check in to see how she was feeling and assess for euglycemic DKA. She overall feels well. No nausea, vomiting. She is eating well. She is drinking well. She does feel somewhat dizzier than baseline. She was not able to get her lantus  or blood glucose monitor due to insurance issues.  I called [employer]. They have approved her glucose meter and supplies. We switched lantus  to [place] 10 units daily given insurance concerns with increasing units by 1 unit daily if CBG >150. I called patient back and informed her of this change and that the medication would be ready at 2 PM. She will go pick this up and administer the insulin  as soon as she gets home.  We also discussed STOPPING the jardiance . She had not taken it yet today. We also discussed that if she experiences nausea, vomiting, stomach upset, or worsening dizziness despite taking the insulin  to promptly present to the ED. Unfortunately, no appointment times were available for Monday, August 11th. I have asked the patient to come in at 3 PM on 8/11, and I will find a space for us  to check in. I will send a message to the admin team to ensure this is possible. She will need repeat BMP at that time.  Patient understanding of plan and very appreciative.

## 2023-12-07 NOTE — Assessment & Plan Note (Addendum)
 A1c remains uncontrolled today at 11.1, up from 9.6 three months ago in the setting of dietary indiscretions and difficulty with her Lantus  regimen.  We discussed restarting Lantus  after shared decision making at 10 units tomorrow morning.  She will go up by 1 unit every morning as long as her sugars are greater than 150 before breakfast.  I have sent in a new blood glucose meter along with strips and lancets.  I will also send a message to Dr. Koval to get her enrolled in the LIBERATE study, if they are still enrolling, to help her get a CGM.  I have also sent a message to our health maintenance pool to get her ophthalmology results for a diabetic eye exam.  We will update her BMP and lipid panel today.  She will follow-up in 2 weeks to further discuss her diabetic regimen; she will let me know if she has any side effects or questions before then.

## 2023-12-09 ENCOUNTER — Ambulatory Visit: Admitting: Family Medicine

## 2023-12-09 ENCOUNTER — Telehealth: Payer: Self-pay | Admitting: Pharmacist

## 2023-12-09 ENCOUNTER — Ambulatory Visit (INDEPENDENT_AMBULATORY_CARE_PROVIDER_SITE_OTHER): Admitting: Family Medicine

## 2023-12-09 VITALS — BP 118/83 | HR 62 | Resp 20

## 2023-12-09 DIAGNOSIS — E1165 Type 2 diabetes mellitus with hyperglycemia: Secondary | ICD-10-CM

## 2023-12-09 DIAGNOSIS — Z794 Long term (current) use of insulin: Secondary | ICD-10-CM

## 2023-12-09 DIAGNOSIS — R4589 Other symptoms and signs involving emotional state: Secondary | ICD-10-CM | POA: Insufficient documentation

## 2023-12-09 NOTE — Telephone Encounter (Addendum)
 Spoke with patient regarding her appt today. She was not able to get her glucose meter working, so she felt she did not need to come in. Advised I can help her set this up at her appointment today. She states will be here today at 3:15 PM. Plan to get meter and BMP at that time.  ----- Message from [facility] Harlene F sent at 12/09/2023  1:47 PM EDT ----- Regarding: RE: APPT for patient off-schedule Pt called to cancel appt.  Unsure of reason why.  Made for next wed ( pt preference)

## 2023-12-09 NOTE — Progress Notes (Addendum)
 S:     68 y.o. female who presents for diabetes evaluation, education, and management in the context of the LIBERATE Study.  Today, patient arrives in  good spirits and presents without  any assistance. Patient was being seen by Dr. Tharon today and patient was willing to stay for study enrollment.   PMH is significant for DM, tobacco use, hypertension, Hyperlipidemia, GERD.   Patient reports Diabetes was diagnosed 2007.   Current diabetes medications include: Jardiance  (empagliflozin ) 25 mg daily, Lantus  (insulin  glargine) 10 units daily, metformin  XR 500 mg 2 tab BID Current hypertension medications include: none Current hyperlipidemia medications include: rosuvastatin  40 mg daily  Patient reports poor adherence to taking all medications as prescribed.   Patient reported dietary habits:  Drinks: cherry coke (1 soda/day)  O:   Review of Systems  All other systems reviewed and are negative.   Physical Exam Constitutional:      Appearance: Normal appearance.  Pulmonary:     Effort: Pulmonary effort is normal.  Neurological:     Mental Status: She is alert.  Psychiatric:        Mood and Affect: Mood normal.        Behavior: Behavior normal.        Thought Content: Thought content normal.        Judgment: Judgment normal.      Lab Results  Component Value Date   HGBA1C 11.1 (A) 12/06/2023    Vitals:   12/09/23 1547  BP: 118/83  Pulse: 62  Resp: 20  SpO2: 97%     Lipid Panel     Component Value Date/Time   CHOL 102 12/06/2023 1709   TRIG 122 12/06/2023 1709   HDL 34 (L) 12/06/2023 1709   CHOLHDL 3.0 12/06/2023 1709   CHOLHDL 2.8 12/02/2015 0945   VLDL 18 12/02/2015 0945   LDLCALC 46 12/06/2023 1709   LDLDIRECT 54 10/10/2022 1020       A/P:  LIBERATE Study:  -Patient provided verbal consent to participate in the study. Consent documented in electronic medical record.  -Provided education on Libre 3 CGM. Collaborated to ensure [place] 3 app was  downloaded on patient's phone. Educated on how to place sensor every 14 days, patient placed first sensor correctly and verbalized understanding of use, removal, and how to place next sensor. Discussed alarms. 8 sensors provided for a 3 month supply. Educated to contact the office if the sensor falls off early and replacements are needed before their next [employer].   Diabetes longstanding since 2007 currently uncontrolled with A1c 11.1%. Last time A1c was less than 8.0 was April 2023. Patient is able to verbalize appropriate hypoglycemia management plan. Control is suboptimal due to poor medication adherence.  Continue Lantus  10 units daily and increase by 1 unit every day if CBG greater than 150 in the mornings.  We have went over how to use her glucose monitor.  She will speak with Dr. Nicoli Nardozzi about getting enrolled in the LIBERATE study and obtaining a CGM.  We will update BMP today to assess for anion gap changes.  Continue to hold Jardiance  for now.  Continue metformin .  Follow-up in 2 weeks   -Discussed Liberate study and placed Libre 3 sensor.  -Advised increasing water intake, reducing sugary drinks and switching over to zero sugar drinks (cherry coke zero). -Goal to see more glucose readings in the 100s by next visit in 2 weeks. -Patient educated on purpose, proper use, and potential adverse effects. -  Extensively discussed pathophysiology of diabetes, recommended lifestyle interventions, dietary effects on blood sugar control.  -Counseled on s/sx of and management of hypoglycemia.  -Plan to collect next A1c in 3 months ~mid November.  Written patient instructions provided. Patient verbalized understanding of treatment plan.  Total time in face to face counseling 27 minutes.    Follow-up:  Pharmacist 12/24/23 10:30 am. PCP clinic visit in TBD. (2 weeks) Patient seen with Fonda Blase, PharmD Candidate - PY3 student and Calton Nash, PharmD Candidate - PY4 student.

## 2023-12-09 NOTE — Progress Notes (Signed)
    SUBJECTIVE:   CHIEF COMPLAINT / HPI:   T2DM follow up Previously seen on 12/06/2023 for type 2 diabetes follow-up.  A1c at that time uncontrolled 11.1 in the setting of not taking Lantus .  BMP returned with an anion gap of 18 and CO2 of 16; patient was instructed to stop her Jardiance  and restart Lantus  at 10 units daily.  She was instructed to go up by 1 unit daily if CBGs first thing in the morning before breakfast were above 150.  She has since been taking 10 units of Lantus  though she has not been able to go up because she has not gotten her glucose monitor working.  She is here today to get this set up as well as for repeat labs.  She continues to feel at her baseline with no nausea, vomiting, bowel changes, confusion. She does mention some depressed mood, but she is not interested in medications or therapy at this time.  OBJECTIVE:   BP 118/83 (BP Location: Right Arm, Patient Position: Sitting, Cuff Size: Small)   Pulse 62   Resp 20   SpO2 97%   General: Alert and oriented, in NAD Skin: Warm, dry, and intact without lesions HEENT: NCAT, EOM grossly normal, midline nasal septum Cardiac: RRR, no m/r/g appreciated Respiratory: CTAB, breathing and speaking comfortably on RA Abdominal: Soft, nontender, nondistended, normoactive bowel sounds Extremities: Moves all extremities grossly equally Neurological: No gross focal deficit Psychiatric: Appropriate mood and affect, no SI  ASSESSMENT/PLAN:   Assessment & Plan Type 2 diabetes mellitus with hyperglycemia, with long-term current use of insulin  (HCC) Continue Lantus  10 units daily and increase by 1 unit every day if CBG greater than 150 in the mornings.  We have went over how to use her glucose monitor.  She will speak with Dr. Koval about getting enrolled in the LIBERATE study and obtaining a CGM.  We will update BMP today to assess for anion gap changes.  Continue to hold Jardiance  for now.  Continue metformin .  Follow-up in 2  weeks. Depressed mood Reassuring no SI today. She declines medications and therapy at this time. Will follow up at subsequent visits, as this is likely contributing to difficult T2DM control.   Stuart Redo, MD [facility]

## 2023-12-09 NOTE — Assessment & Plan Note (Signed)
 Reassuring no SI today. She declines medications and therapy at this time. Will follow up at subsequent visits, as this is likely contributing to difficult T2DM control.

## 2023-12-09 NOTE — Telephone Encounter (Signed)
-----   Message from [facility] sent at 12/07/2023  9:15 AM EDT ----- We still enrolling patients in the LIBERATE study?  If so, I feel like this patient would greatly benefit, and I have spoken with her about it, and she is amenable to this.

## 2023-12-09 NOTE — Assessment & Plan Note (Addendum)
 Continue Lantus  10 units daily and increase by 1 unit every day if CBG greater than 150 in the mornings.  We have went over how to use her glucose monitor.  She will speak with Dr. Koval about getting enrolled in the LIBERATE study and obtaining a CGM.  We will update BMP today to assess for anion gap changes.  Continue to hold Jardiance  for now.  Continue metformin .  Follow-up in 2 weeks.

## 2023-12-09 NOTE — Telephone Encounter (Signed)
 Attempted to contact patient for follow-up of potential enrollment in Liberate study for Libre CGM.    Left HIPAA compliant voice mail requesting call back to direct phone: [phone]  Total time with patient call and documentation of interaction: 4 minutes.  Follow-up phone call planned: 1 week

## 2023-12-09 NOTE — Patient Instructions (Addendum)
 Continue your insulin  regimen. Keep up the great work. Come back in 2 weeks to check in. Be sure to keep me updated on how you are doing and if you develop any nausea, vomiting, bowel changes, fever, or confusion.  Sensor Application If using the App, you can tap Help in the Main Menu to access an in-app tutorial on applying a Sensor. See below for instructions on how to download the app. Apply Sensors only on the back of your upper arm. If placed in other areas, the Sensor may not function properly and could give you inaccurate readings. Avoid areas with scars, moles, stretch marks, or lumps.   Select an area of skin that generally stays flat during your normal daily activities (no bending or folding). Choose a site that is at least 1 inch (2.5 cm) away from any injection sites. To prevent discomfort or skin irritation, you should select a different site other than the one most recently used. Wash application site using a plain soap, dry, and then clean with an alcohol wipe. This will help remove any oily residue that may prevent the sensor from sticking properly. Allow site to air dry before proceeding. Note: The area MUST be clean and dry, or the Sensor may not stay on for the full wear duration specified by your Sensor insert. 4. Unscrew the cap from the Sensor Applicator and set the cap aside.  5. Place the Sensor Applicator over the prepared site and push down firmly to apply the Sensor to your body. 6. Gently pull the Sensor Applicator away from your body. The Sensor should now be attached to your skin. 7. Make sure the Sensor is secure after application. Put the cap back on the Sensor Applicator. Discard the used Engineer, agricultural according to local regulations.  What If My Sensor Falls Off or What If My Sensor Isn't Working? Call [employer] Customer Care Team at [phone] Available 7 days a week from 8AM-8PM EST, excluding holidays If you have multiple sensors fall, contact [facility] at [phone]

## 2023-12-10 ENCOUNTER — Encounter (INDEPENDENT_AMBULATORY_CARE_PROVIDER_SITE_OTHER): Payer: Self-pay | Admitting: Ophthalmology

## 2023-12-10 ENCOUNTER — Ambulatory Visit: Payer: Self-pay | Admitting: Family Medicine

## 2023-12-10 LAB — BASIC METABOLIC PANEL WITH GFR
BUN/Creatinine Ratio: 14 (ref 12–28)
BUN: 12 mg/dL (ref 8–27)
CO2: 18 mmol/L — ABNORMAL LOW (ref 20–29)
Calcium: 10 mg/dL (ref 8.7–10.3)
Chloride: 110 mmol/L — ABNORMAL HIGH (ref 96–106)
Creatinine, Ser: 0.87 mg/dL (ref 0.57–1.00)
Glucose: 142 mg/dL — ABNORMAL HIGH (ref 70–99)
Potassium: 4.3 mmol/L (ref 3.5–5.2)
Sodium: 146 mmol/L — ABNORMAL HIGH (ref 134–144)
eGFR: 72 mL/min/1.73 (ref >59)

## 2023-12-10 NOTE — Telephone Encounter (Signed)
 Reviewed and agree with Dr Rennis plan.

## 2023-12-10 NOTE — Progress Notes (Signed)
 Reviewed and agree with Dr Rennis plan.

## 2023-12-12 ENCOUNTER — Ambulatory Visit (INDEPENDENT_AMBULATORY_CARE_PROVIDER_SITE_OTHER): Admitting: Pharmacist

## 2023-12-12 ENCOUNTER — Encounter: Payer: Self-pay | Admitting: Pharmacist

## 2023-12-12 DIAGNOSIS — Z794 Long term (current) use of insulin: Secondary | ICD-10-CM

## 2023-12-12 DIAGNOSIS — E1165 Type 2 diabetes mellitus with hyperglycemia: Secondary | ICD-10-CM

## 2023-12-12 NOTE — Progress Notes (Signed)
    S:     Chief Complaint  Patient presents with   Medication Management    CGM Connection Problem   68 y.o. female who presents for a no charge visit for technology assistance. Patient started on CGM for Liberate study on 12/09/23, patient's phone lost signal to [place] sensor and patient was unable to pair them at home. Issue was resolved by turning phone Bluetooth off and on again, sensor connected successfully. Patient reports loving the CGM.  Follow-up:  Pharmacist 12/24/23 PCP clinic visit in 12/24/23 Patient seen with Fonda Blase, PharmD Candidate - PY3 student and Calton Nash, PharmD Candidate - PY4 student.

## 2023-12-12 NOTE — Patient Instructions (Signed)
 Thanks for coming in to re-connect your [place] sensor to your phone! See you again for follow-up on 12/24/23.

## 2023-12-13 NOTE — Progress Notes (Signed)
 Reviewed and agree with Dr Rennis plan.

## 2023-12-14 ENCOUNTER — Telehealth: Payer: Self-pay | Admitting: Family Medicine

## 2023-12-14 NOTE — Telephone Encounter (Signed)
 Attempted to call patient x 2.  Left voicemail to call back.  Whenever she calls back, please assess if she can move her appointment time on 8/26 AM to 8/26 in the PM, as I may not be seeing patients that morning.

## 2023-12-18 ENCOUNTER — Ambulatory Visit: Admitting: Family Medicine

## 2023-12-24 ENCOUNTER — Ambulatory Visit: Admitting: Family Medicine

## 2023-12-24 ENCOUNTER — Ambulatory Visit (INDEPENDENT_AMBULATORY_CARE_PROVIDER_SITE_OTHER): Admitting: Pharmacist

## 2023-12-24 ENCOUNTER — Encounter: Payer: Self-pay | Admitting: Pharmacist

## 2023-12-24 VITALS — BP 105/73 | HR 67 | Wt 134.4 lb

## 2023-12-24 DIAGNOSIS — E1100 Type 2 diabetes mellitus with hyperosmolarity without nonketotic hyperglycemic-hyperosmolar coma (NKHHC): Secondary | ICD-10-CM

## 2023-12-24 DIAGNOSIS — E119 Type 2 diabetes mellitus without complications: Secondary | ICD-10-CM

## 2023-12-24 DIAGNOSIS — Z794 Long term (current) use of insulin: Secondary | ICD-10-CM

## 2023-12-24 MED ORDER — LANTUS SOLOSTAR 100 UNIT/ML ~~LOC~~ SOPN: 14.0000 [IU] | PEN_INJECTOR | Freq: Every day | SUBCUTANEOUS | Status: DC

## 2023-12-24 NOTE — Patient Instructions (Addendum)
 It was nice to see you today! You're doing wonderfully, keep up the good work!  Your goal blood sugar is 80-130 before eating and less than 180 after eating.  Medication Changes: Adjust Lantus  (insulin  glargine) to 14 units daily  Continue all other medication the same.  Keep up the good work with diet and exercise. Aim for a diet full of vegetables, fruit and lean meats (chicken, [place], fish). Try to limit salt intake by eating fresh or frozen vegetables (instead of canned), rinse canned vegetables prior to cooking and do not add any additional salt to meals.     Sensor Application If using the App, you can tap Help in the Main Menu to access an in-app tutorial on applying a Sensor. See below for instructions on how to download the app. Apply Sensors only on the back of your upper arm. If placed in other areas, the Sensor may not function properly and could give you inaccurate readings. Avoid areas with scars, moles, stretch marks, or lumps.   Select an area of skin that generally stays flat during your normal daily activities (no bending or folding). Choose a site that is at least 1 inch (2.5 cm) away from any injection sites. To prevent discomfort or skin irritation, you should select a different site other than the one most recently used. Wash application site using a plain soap, dry, and then clean with an alcohol wipe. This will help remove any oily residue that may prevent the sensor from sticking properly. Allow site to air dry before proceeding. Note: The area MUST be clean and dry, or the Sensor may not stay on for the full wear duration specified by your Sensor insert. 4. Unscrew the cap from the Sensor Applicator and set the cap aside.  5. Place the Sensor Applicator over the prepared site and push down firmly to apply the Sensor to your body. 6. Gently pull the Sensor Applicator away from your body. The Sensor should now be attached to your skin. 7. Make sure the Sensor is secure  after application. Put the cap back on the Sensor Applicator. Discard the used Engineer, agricultural according to local regulations.  What If My Sensor Falls Off or What If My Sensor Isn't Working? Call [employer] Customer Care Team at [phone] Available 7 days a week from 8AM-8PM EST, excluding holidays If you have multiple sensors fall, contact [facility] at [phone]   The App Download the FreeStyle [place] 3 App in your phone's app store   Load the app and select get started now Create an account  Tap scan new sensor Follow the prompts on the screen. If your sensor does not sync, try moving your phone slowly around the sensor. Phone cases may affect scanning. This will be the only time you have to scan the sensor until you apply a new sensor.   There will be a 60 minute start up period until the app will display your glucose reading   How To Share Your Readings With Us  Once in the app, go to settings -> connected apps -> LibreView -> Enter Practice ID -> [id number]

## 2023-12-24 NOTE — Progress Notes (Signed)
 S:     Chief Complaint  Patient presents with   Medication Management    Liberate study 2-week follow-up   68 y.o. female who presents for diabetes evaluation, education, and management. Patient arrives in good spirits and presents without any assistance.   Patient was referred and last seen by Primary Care Provider, Dr. Tharon, on 12/09/23.  At last visit with Dr. Tharon she was enrolled in the Liberate study and after came in briefly for a bluetooth issue with [facility] 3 app.   PMH is significant for DM, TUD, hyperlipidemia, and hypertension.  Patient reports Diabetes was diagnosed in 2007.   Family/Social History: Reports Dad and brother had complications with diabetes.  Now smoking 0.5 ppd, and will try to decrease by follow-up with Dr. Tharon. Offered resources to help motivate to quit, patient declined as it is something she wants to do on her own.  Current diabetes medications include: Lantus  (insulin  glargine) increased gradually from 10 to 15 units daily since last visit, Metformin  XR 500 mg 2 tablets twice daily with food Current hyperlipidemia medications include: Rosuvastatin  40 mg daily  Patient reports adherence to taking all medications as prescribed besides Jardiance  (empagliflozin ) as she was told to hold by Dr. Tharon due to recent change in urine appearance. Urine is getting better but still sudsy.  Patient denies hypoglycemic events. Patient denies nocturia (nighttime urination).  Patient denies neuropathy (nerve pain). Patient denies visual changes. Patient denies self foot exams.   Patient reported dietary habits: Breakfast: one slice of toast Drinks: Started drinking less soda and increased water intake. When drinking soda is now using zero. May drink a corona or two over the weekend. Unsweetened tea with Splenda added.  Dealing with managing portion control  Reports wanting to get to only one soda a week even with the switch to only zero calorie sodas. Reports  realizing wheat bread, rice, and potatoes spiked sugars  PHQ-9 Score: 0  Patient-reported exercise habits: Walking on the job  O:   Review of Systems  All other systems reviewed and are negative.   Physical Exam Constitutional:      Appearance: Normal appearance.  Pulmonary:     Effort: Pulmonary effort is normal.  Neurological:     Mental Status: She is alert.  Psychiatric:        Mood and Affect: Mood normal.        Behavior: Behavior normal.        Thought Content: Thought content normal.        Judgment: Judgment normal.    Libre3 CGM Download today 12/23/2023 % Time CGM is active: 99% Average Glucose: 198 mg/dL Glucose Management Indicator: 8.0  Glucose Variability: 32.2% (goal <36%) Time in Goal:  - Time in range 70-180: 46% - Time above range: 54% (20%> 250) - Time below range: 0%  Lab Results  Component Value Date   HGBA1C 11.1 (A) 12/06/2023   Vitals:   12/24/23 1037  BP: 105/73  Pulse: 67  SpO2: 100%    Lipid Panel     Component Value Date/Time   CHOL 102 12/06/2023 1709   TRIG 122 12/06/2023 1709   HDL 34 (L) 12/06/2023 1709   CHOLHDL 3.0 12/06/2023 1709   CHOLHDL 2.8 12/02/2015 0945   VLDL 18 12/02/2015 0945   LDLCALC 46 12/06/2023 1709   LDLDIRECT 54 10/10/2022 1020    Clinical Atherosclerotic Cardiovascular Disease (ASCVD): No   A/P: Diabetes longstanding since 2007 currently improving. Patient is able  to verbalize appropriate hypoglycemia management plan. Medication adherence appears good. -Adjusted dose of basal insulin  Lantus  (insulin  glargine) to 14 units daily in the morning.  -Continued to hold Jardiance  (empagliflozin ) 25 mg until appointment with Dr. Tharon on 01/28/2024 -Continued metformin  XR 500 mg 2 tablets twice daily with food.  -Patient educated on purpose, proper use, and potential adverse effects.  -Extensively discussed pathophysiology of diabetes, recommended lifestyle interventions, dietary effects on blood sugar  control.  -Counseled on s/sx of and management of hypoglycemia.  -Next A1c anticipated of 8.0 if she continues with current trend.   Written patient instructions provided. Patient verbalized understanding of treatment plan.  Total time in face to face counseling 27 minutes.    Follow-up:  Pharmacist 01/2024 PCP clinic visit with Dr. Tharon 0/9/30/25 Patient seen with Fonda Blase, PharmD Candidate - PY3 student and Calton Nash, PharmD Candidate - PY4 student.

## 2023-12-24 NOTE — Assessment & Plan Note (Addendum)
 Diabetes longstanding since 2007 currently improving. Patient is able to verbalize appropriate hypoglycemia management plan. Medication adherence appears good. -Adjusted dose of basal insulin  Lantus  (insulin  glargine) to 14 units daily in the morning.  -Continued to hold Jardiance  (empagliflozin ) 25 mg until appointment with Dr. Tharon on 01/28/2024 -Continued metformin  XR 500 mg 2 tablets twice daily with food.  -Patient educated on purpose, proper use, and potential adverse effects.  -Extensively discussed pathophysiology of diabetes, recommended lifestyle interventions, dietary effects on blood sugar control.  -Counseled on s/sx of and management of hypoglycemia.  -Next A1c anticipated of 8.0 if she continues with current trend. ted of 8.0 if she continues with current trend.

## 2023-12-25 NOTE — Progress Notes (Signed)
 Reviewed and agree with Dr Rennis plan.

## 2023-12-31 ENCOUNTER — Other Ambulatory Visit: Payer: Self-pay | Admitting: Family Medicine

## 2023-12-31 DIAGNOSIS — E785 Hyperlipidemia, unspecified: Secondary | ICD-10-CM

## 2024-01-28 ENCOUNTER — Ambulatory Visit: Admitting: Family Medicine

## 2024-01-28 NOTE — Progress Notes (Incomplete)
    SUBJECTIVE:   CHIEF COMPLAINT / HPI:   T2DM Previously saw by me on 12/09/2023 for T2DM follow-up where A1c was 11.1.  We discussed increasing her Lantus  daily to target a CBG fasting around 150 in the mornings; units were at 15 at last visit.  She has spoken with Dr. Koval about using her CGM with the liberate study.  We have continue metformin  1000 mg twice daily.  She is continue to hold Jardiance  in the setting of concern for potential euglycemic DKA.  Tobacco use Intermittently decreasing her use.  Previously smoking half a pack per day.  PERTINENT  PMH / PSH: Hypertension, GERD, HLD, depressed mood  OBJECTIVE:   There were no vitals taken for this visit.  General: Alert and oriented, in NAD Skin: Warm, dry, and intact without lesions HEENT: NCAT, EOM grossly normal, midline nasal septum Cardiac: RRR, no m/r/g appreciated Respiratory: CTAB, breathing and speaking comfortably on RA Abdominal: Soft, nontender, nondistended, normoactive bowel sounds Extremities: Moves all extremities grossly equally Neurological: No gross focal deficit Psychiatric: Appropriate mood and affect   ASSESSMENT/PLAN:   Assessment & Plan      Stuart Redo, MD [facility]

## 2024-02-06 NOTE — Progress Notes (Signed)
 Haley Mendez                                          MRN: [id number]   02/06/2024   The VBCI Quality Team Specialist reviewed this patient medical record for the purposes of chart review for care gap closure. The following were reviewed: chart review for care gap closure-glycemic status assessment.    VBCI Quality Team

## 2024-02-14 ENCOUNTER — Other Ambulatory Visit: Payer: Self-pay

## 2024-02-14 DIAGNOSIS — E119 Type 2 diabetes mellitus without complications: Secondary | ICD-10-CM

## 2024-02-14 MED ORDER — LANTUS SOLOSTAR 100 UNIT/ML ~~LOC~~ SOPN: 14.0000 [IU] | PEN_INJECTOR | Freq: Every day | SUBCUTANEOUS | 0 refills | Status: DC

## 2024-02-14 NOTE — Telephone Encounter (Signed)
 Patient requesting that rx be sent to pharmacy for Lantus .   She reports that rx on file is for 10 units daily, however, Dr. Koval switched her to 14 units.  Rx ordered by Dr. Koval was for No print.   Dispense quantity and refills were not indicated.   I have pended med to encounter. Please include how many refills.   Thanks.   Chiquita JAYSON English, RN

## 2024-02-17 ENCOUNTER — Telehealth: Payer: Self-pay | Admitting: Pharmacist

## 2024-02-17 DIAGNOSIS — E119 Type 2 diabetes mellitus without complications: Secondary | ICD-10-CM

## 2024-02-17 MED ORDER — BD PEN NEEDLE NANO 2ND GEN 32G X 4 MM MISC: 1.0000 | Freq: Every day | 11 refills | Status: AC

## 2024-02-17 MED ORDER — ACCU-CHEK GUIDE TEST VI STRP: ORAL_STRIP | 12 refills | Status: AC

## 2024-02-17 MED ORDER — TRUEPLUS LANCETS 28G MISC: 1.0000 | Freq: Three times a day (TID) | 12 refills | Status: AC

## 2024-02-17 MED ORDER — BASAGLAR KWIKPEN 100 UNIT/ML ~~LOC~~ SOPN: 14.0000 [IU] | PEN_INJECTOR | Freq: Every day | SUBCUTANEOUS | 5 refills | Status: DC

## 2024-02-17 NOTE — Telephone Encounter (Signed)
 Patient contacts office to report that she has been out of insulin  for almost a week and that she is having trouble with her CGM sensor and starting a new one.   Patient requests assistance with CGM (continuous glucose monitor) supply and requests to come in 10/21 to get new sensor applied. Will come in at 11 am.  Requested refill of Basaglar  (insulin  glargine) to be sent to pharmacy as she has been out for the past week. Refill sent to pharmacy for needle tips, lancets, glucometer strips, and Basaglar  (insulin  glargine).  Total time with patient call and documentation of interaction: 9 minutes.

## 2024-02-18 ENCOUNTER — Encounter: Payer: Self-pay | Admitting: Pharmacist

## 2024-02-18 ENCOUNTER — Ambulatory Visit: Admitting: Pharmacist

## 2024-02-18 VITALS — BP 105/73 | HR 65 | Wt 136.4 lb

## 2024-02-18 DIAGNOSIS — Z794 Long term (current) use of insulin: Secondary | ICD-10-CM

## 2024-02-18 DIAGNOSIS — E1100 Type 2 diabetes mellitus with hyperosmolarity without nonketotic hyperglycemic-hyperosmolar coma (NKHHC): Secondary | ICD-10-CM | POA: Diagnosis not present

## 2024-02-18 NOTE — Progress Notes (Signed)
    S:     Chief Complaint  Patient presents with   Medication Management    Diabetes - CGM   68 y.o. female who presents for diabetes evaluation, education, and management. Patient arrives in fair-good spirits and presents without any assistance.    Patient arrives today for restart of CGM with phone and sensor.  Majority of visit focused on restart of CGM  O:   Review of Systems  All other systems reviewed and are negative.   Physical Exam Vitals reviewed.  Pulmonary:     Effort: Pulmonary effort is normal.  Neurological:     Mental Status: She is alert.  Psychiatric:        Mood and Affect: Mood normal.        Behavior: Behavior normal.        Thought Content: Thought content normal.        Judgment: Judgment normal.     Lab Results  Component Value Date   HGBA1C 11.1 (A) 12/06/2023   Vitals:   02/18/24 1110  BP: 105/73  Pulse: 65  SpO2: 99%    Lipid Panel     Component Value Date/Time   CHOL 102 12/06/2023 1709   TRIG 122 12/06/2023 1709   HDL 34 (L) 12/06/2023 1709   CHOLHDL 3.0 12/06/2023 1709   CHOLHDL 2.8 12/02/2015 0945   VLDL 18 12/02/2015 0945   LDLCALC 46 12/06/2023 1709   LDLDIRECT 54 10/10/2022 1020    A/P: Diabetes longstanding and currently off insulin  for several days due to running out from recent dose increase and lack of new prescription. Patient CGM reconnected.  -Continued basal insulin  Basaglar  (insulin  glargine) 14 units once daily.  -Extensively discussed pathophysiology of diabetes, recommended lifestyle interventions, dietary effects on blood sugar control.  -Counseled on s/sx of and management of hypoglycemia.  - Recontacted pharmacy and clarified prescriptions from 02/17/2024   Written patient instructions provided. Patient verbalized understanding of treatment plan.  Total time in face to face counseling  minutes.    Follow-up:  PCP clinic visit in 03/19/2024 Patient seen with Lawson Mao, PharmD Candidate - PY3 student  and Belvie Macintosh, PharmD - PY4 Candidate.

## 2024-02-18 NOTE — Assessment & Plan Note (Signed)
 Diabetes longstanding and currently off insulin  for several days due to running out from recent dose increase and lack of new prescription. Patient CGM reconnected.  -Continued basal insulin  Basaglar  (insulin  glargine) 14 units once daily.  -Extensively discussed pathophysiology of diabetes, recommended lifestyle interventions, dietary effects on blood sugar control.  -Counseled on s/sx of and management of hypoglycemia.  - Recontacted pharmacy and clarified prescriptions from 02/17/2024

## 2024-02-18 NOTE — Patient Instructions (Signed)
 It was nice to see you today!  We restarted your [employer] today.  You should have number in 1 hour.  Your goal blood sugar is 80-130 before eating and less than 180 after eating.  Medication Changes: Continue all other medication the same.  No changes today.  Keep up the good work with diet and exercise. Aim for a diet full of vegetables, fruit and lean meats (chicken, [place], fish). Try to limit salt intake by eating fresh or frozen vegetables (instead of canned), rinse canned vegetables prior to cooking and do not add any additional salt to meals.    What If My Sensor Falls Off or What If My Sensor Isn't Working? Call [employer] Customer Care Team at [phone]

## 2024-02-18 NOTE — Progress Notes (Signed)
 Reviewed and agree with Dr Rennis plan.

## 2024-03-03 ENCOUNTER — Telehealth: Payer: Self-pay | Admitting: Pharmacist

## 2024-03-03 NOTE — Telephone Encounter (Signed)
 Patient returned call and communicated that it would be OK with her to schedule the Liberate Study visit @ the time of her upcoming PCP visit with Dr. Tharon  11/20  New appointment for Liberate study visit placed.   11/20 at 8:30  Immediately prior to PCP visit.

## 2024-03-19 ENCOUNTER — Ambulatory Visit: Admitting: Family Medicine

## 2024-03-19 ENCOUNTER — Encounter: Admitting: Pharmacist

## 2024-03-19 ENCOUNTER — Encounter: Payer: Self-pay | Admitting: Family Medicine

## 2024-03-19 ENCOUNTER — Encounter: Payer: Self-pay | Admitting: Pharmacist

## 2024-03-19 VITALS — BP 119/91

## 2024-03-19 VITALS — BP 120/87 | HR 65 | Ht 63.0 in | Wt 136.4 lb

## 2024-03-19 DIAGNOSIS — E785 Hyperlipidemia, unspecified: Secondary | ICD-10-CM

## 2024-03-19 DIAGNOSIS — E1100 Type 2 diabetes mellitus with hyperosmolarity without nonketotic hyperglycemic-hyperosmolar coma (NKHHC): Secondary | ICD-10-CM

## 2024-03-19 DIAGNOSIS — I10 Essential (primary) hypertension: Secondary | ICD-10-CM

## 2024-03-19 DIAGNOSIS — F17200 Nicotine dependence, unspecified, uncomplicated: Secondary | ICD-10-CM | POA: Diagnosis not present

## 2024-03-19 DIAGNOSIS — E119 Type 2 diabetes mellitus without complications: Secondary | ICD-10-CM | POA: Diagnosis not present

## 2024-03-19 DIAGNOSIS — Z794 Long term (current) use of insulin: Secondary | ICD-10-CM

## 2024-03-19 DIAGNOSIS — R82998 Other abnormal findings in urine: Secondary | ICD-10-CM

## 2024-03-19 LAB — POCT GLYCOSYLATED HEMOGLOBIN (HGB A1C): HbA1c, POC (controlled diabetic range): 9.5 % — AB (ref 0.0–7.0)

## 2024-03-19 LAB — POCT URINALYSIS DIP (MANUAL ENTRY)
Bilirubin, UA: NEGATIVE
Blood, UA: NEGATIVE
Glucose, UA: 100 mg/dL — AB
Ketones, POC UA: NEGATIVE mg/dL
Leukocytes, UA: NEGATIVE
Nitrite, UA: NEGATIVE
Protein Ur, POC: 100 mg/dL — AB
Spec Grav, UA: 1.02 (ref 1.010–1.025)
Urobilinogen, UA: 1 U/dL
pH, UA: 7 (ref 5.0–8.0)

## 2024-03-19 LAB — POCT UA - MICROSCOPIC ONLY
RBC, Urine, Miroscopic: NONE SEEN (ref 0–2)
WBC, Ur, HPF, POC: NONE SEEN (ref 0–5)

## 2024-03-19 MED ORDER — BASAGLAR KWIKPEN 100 UNIT/ML ~~LOC~~ SOPN: 12.0000 [IU] | PEN_INJECTOR | Freq: Every day | SUBCUTANEOUS | 5 refills | Status: DC

## 2024-03-19 MED ORDER — NICOTINE 7 MG/24HR TD PT24
7.0000 mg | MEDICATED_PATCH | Freq: Every day | TRANSDERMAL | 3 refills | Status: AC
Start: 2024-03-19 — End: ?

## 2024-03-19 MED ORDER — NICOTINE 14 MG/24HR TD PT24: 14.0000 mg | MEDICATED_PATCH | Freq: Every day | TRANSDERMAL | 0 refills | Status: DC

## 2024-03-19 NOTE — Assessment & Plan Note (Signed)
 Smokes 7-8 cigarettes daily. Desires to quit independently but open to nicotine  patches. - Prescribed 7 mg nicotine  patches - Encouraged reduction in cigarette use.

## 2024-03-19 NOTE — Assessment & Plan Note (Addendum)
 LIBERATE Study 3 month visit:  - 8 sensors provided for a 3 month supply.  Diabetes longstanding currently progressing towards good control with GMI 7.6% and A1c today of 9.5. Patient is  able to verbalize appropriate hypoglycemia management plan. Medication adherence appears good. Multiple readings less than 70 on CGM.  -Decreased dose of basal insulin  Basaglar  (insulin  glargine) from 14 to 12 units daily -Continued metformin  XL at 1000 mg BID

## 2024-03-19 NOTE — Progress Notes (Signed)
 Reviewed and agree.

## 2024-03-19 NOTE — Research (Signed)
 S:     Chief Complaint  Patient presents with   Medication Management    DM management and LIBERATE 53m F/U   68 y.o. female who presents for diabetes evaluation, education, and management in the context of the LIBERATE Study.  Today, patient arrives in  good spirits and presents without  any assistance.   Patient was referred and last seen by Primary Care Provider, Dr. Tharon today  PNH is significant for T2DM, hypertension, hyperlipidemia, tobacco abuse, GERD.   Patient reports Diabetes was diagnosed 2011.   Current diabetes medications include: Basaglar  (insulin  glargine) 14 units daily and Metformin  1000 mg BID Current hyperlipidemia medications include: Rosuvastatin  40 mg daily  Patient reports adherence to taking all medications as prescribed.   Patient reported dietary habits:  Snacks: fruit such as pears, apples Drinks: water, crystallite lemonade  O:  Review of Systems  All other systems reviewed and are negative.   Physical Exam Vitals reviewed.  Constitutional:      Appearance: Normal appearance.  Pulmonary:     Effort: Pulmonary effort is normal.  Neurological:     Mental Status: She is alert.  Psychiatric:        Mood and Affect: Mood normal.        Behavior: Behavior normal.        Thought Content: Thought content normal.        Judgment: Judgment normal.    Libre3 CGM Download today 03/06/2024-03/19/2024 % Time CGM is active: 77% Average Glucose: 178 mg/dL Glucose Management Indicator: 7.6  Glucose Variability: 35.3% (goal <36%) Time in Goal:  - Time in range 70-180: 56% - Time above range: 44% - Time below range: 0% Observed patterns:  Lab Results  Component Value Date   HGBA1C 9.5 (A) 03/19/2024     Vitals:   03/19/24 1002  BP: (!) 119/91   Lipid Panel     Component Value Date/Time   CHOL 102 12/06/2023 1709   TRIG 122 12/06/2023 1709   HDL 34 (L) 12/06/2023 1709   CHOLHDL 3.0 12/06/2023 1709   CHOLHDL 2.8 12/02/2015 0945    VLDL 18 12/02/2015 0945   LDLCALC 46 12/06/2023 1709   LDLDIRECT 54 10/10/2022 1020   A/P:  LIBERATE Study 3 month visit:  - 8 sensors provided for a 3 month supply. Educated to contact the office if the sensor falls off early and replacements are needed before their next [employer].   Diabetes longstanding currently progressing towards good control with GMI 7.6% and A1c today of 9.5. Patient is  able to verbalize appropriate hypoglycemia management plan. Medication adherence appears good. Multiple readings less than 70 on CGM.  -Decreased dose of basal insulin  Basaglar  (insulin  glargine) from 14 to 12 units daily -Continued metformin  XL at 1000 mg BID.  -Patient educated on purpose, proper use, and potential adverse effects.  -Extensively discussed pathophysiology of diabetes, recommended lifestyle interventions, dietary effects on blood sugar control.  -Counseled on s/sx of and management of hypoglycemia.   ASCVD risk - primary  prevention in patient with diabetes. Last LDL is 46 at goal of <70  mg/dL. high intensity statin indicated.  -Continued Rosuvastatin  40 mg.   History of tobacco abuse and is currently smoking half pack per day. Patient reports high priority and motivation to quit. Counseled patient on importance of quitting and health benefits, as well as provided information on quit line.  -Started Nicotine  7mg  patch daily  Written patient instructions provided. Patient verbalized understanding of  treatment plan.  Total time in face to face counseling 26 minutes.    Follow-up:  Pharmacist 05/06/2023 Patient seen with Lawson Mao, PharmD Candidate - PY3 student and Recardo [employer] PharmD - PY4 Candidate.  SABRA

## 2024-03-19 NOTE — Assessment & Plan Note (Signed)
 A1c improved to 9.5 with estimated A1c from CGM around 7.5%. Proteinuria indicates diabetic kidney disease. Jardiance  discontinued due to ketones. Insulin  and metformin  regimen ongoing.  - Decreased insulin  to 12 units daily per Dr. Koval given some lows in high 60s during the day, reassuringly not at night - Continue metformin , 4 tablets daily. - Recheck A1c in 3 months.

## 2024-03-19 NOTE — Progress Notes (Signed)
   SUBJECTIVE:   CHIEF COMPLAINT / HPI:  Discussed the use of AI scribe software for clinical note transcription with the patient, who gave verbal consent to proceed.  History of Present Illness Haley Mendez is a 68 year old female with diabetes who presents for follow-up of her blood sugar control and medication management.  Glycemic control - Blood glucose levels have fluctuated, with high in 200s and lows in 60s. - Monitors blood glucose once in the morning and sometimes in the evening - Current medication regimen includes 14 units of insulin  daily and metformin , four tablets daily (two in the morning, one at lunch, one at dinner) - Hemoglobin A1c improved from 11.1% to 9.5% over the past three months - CGM estimated glycemic control around 7.5%  Urinary abnormalities - Experiences 'sudsy' urine - No dysuria - Proteinuria noted in April 2025, now off jardiance  due to metabolic acidosis and ARB due to softer pressures  Tobacco use - Smokes seven to eight cigarettes per day - Desires to quit independently - Open to using a nicotine  patch  Blood pressure management - Previously managed with losartan , discontinued due to low blood pressure readings   OBJECTIVE:  BP 120/87   Pulse 65   Ht 5' 3 (1.6 m)   Wt 136 lb 6.4 oz (61.9 kg)   SpO2 100%   BMI 24.16 kg/m   Physical Exam GENERAL: Alert, cooperative, well developed, no acute distress. CHEST: Breathing and speaking comfortably on RA. EXTREMITIES: No cyanosis or edema. NEUROLOGICAL: Cranial nerves grossly intact, moves all extremities without gross motor or sensory deficit.  ASSESSMENT/PLAN:   Assessment & Plan Type 2 diabetes mellitus without complication, without long-term current use of insulin  (HCC) A1c improved to 9.5 with estimated A1c from CGM around 7.5%. Proteinuria indicates diabetic kidney disease. Jardiance  discontinued due to ketones. Insulin  and metformin  regimen ongoing.  - Decreased insulin  to 12  units daily per Dr. Koval given some lows in high 60s during the day, reassuringly not at night - Continue metformin , 4 tablets daily. - Recheck A1c in 3 months. Foamy urine UA completed today with high protein and glucose, likely contributing to foaminess of urine. Continue working on T2DM control as above and consider SGLT-2i and ARB again as tolerated. TOBACCO ABUSE Smokes 7-8 cigarettes daily. Desires to quit independently but open to nicotine  patches. - Prescribed 7 mg nicotine  patches - Encouraged reduction in cigarette use. Primary hypertension Controlled today overall. Losartan  previously discontinued. Focus on diabetes and kidney protection. - Consider ARB as needed in the future for BP and kidney protection.   Stuart Redo, MD [facility]

## 2024-03-19 NOTE — Assessment & Plan Note (Signed)
 Controlled today overall. Losartan  previously discontinued. Focus on diabetes and kidney protection. - Consider ARB as needed in the future for BP and kidney protection.

## 2024-03-19 NOTE — Assessment & Plan Note (Addendum)
 History of tobacco abuse and is currently smoking half pack per day. Patient reports high priority and motivation to quit. Counseled patient on importance of quitting and health benefits, as well as provided information on quit line.  -Started Nicotine  7mg  patch daily

## 2024-03-19 NOTE — Patient Instructions (Addendum)
 Nice to see you today!  Medication Changes: - START Nicotine  patch 7 mg daily - Reduce dose of Basaglar  (insulin  glargine) from 14 units to 12 units daily - Continue all other medication the same.  Tobacco Patient Instructions  Quitting smoking is one of the most important decisions you can make for your current and future health. Consider what you dislike about smoking and how quitting could personally benefit you. Try to cut down.   Aim for reducing the amount you smoke by half over the next 2 weeks.  Starting today, Be a Quitter!  Remind yourself why you want to quit.  Delay your first cigarette of the day for as long as possible.  Start cleaning out all pockets, drawers, and your car of cigarettes.  Getting Through the Cravings Once You Are Smoke Free: Each craving will last about 10 minutes, whether or not you smoke. Here's how to get through the cravings without cigarettes:  DELAY: Tell yourself that you'll wait for the next craving. Do it every time! DEEP BREATHS: One reason smoking feels good is because you breathe in deeply to inhale. Take four slow, deep breaths and feel the relaxation without the hamful effects of cigarettes. DRINK WATER: Drink a glass of cool water. It will give your hands and mouth something to do and will help flush the nicotine  out of your system faster. DIVERT: Do something else -- brush your teeth, take a walk, call a friend who can offer you support. Just moving onto something other than thinking about cigarettes will move you through the craving.   Frequently Asked Questions  What can I do when I get the urge to smoke? To get through the urge to smoke, try the following:  Review your reasons for quitting and think of all the benefits to your health, your finances, and your family.  Remind yourself that there is no such thing as just one cigarette -- or even one puff.  Ride out the desire to smoke. Use the 4 Os -- Delay, Deep Breaths, Drink Water  and Divert to get you through. The craving will go away eventually. Do not fool yourself into thinking you can have just one cigarette.  Any tips on how to deal with stress? Stress is a natural part of life. The key is to deal with it without reaching for a cigarette. Taking deep breaths, counting backwards from 10 and asking yourself 1-how big a deal is this?"  Writing down your feelings, talking with a friend and doing things like positive self-talk and meditation are some other ways that people deal with daily stress.  What if I start smoking again? Slips happen. Most people try to quit smoking a few times before they are successful. Don't beat yourself up if this happens to you! Ask yourself if this was a slip or a relapse. A slip is a one-time mistake that is quickly corrected. A relapse is going back to your old smoking habits.   If you slip, don't give up. Think of it as a learning experience. Ask yourself what went wrong and renew your commitment to staying away from smoking for good.  If you relapse, try not to get discouraged. Ask yourself the question "What caused me to start smoking?" Figure out what helped you and what didn't when you tried to quit. Knowing why you relapsed is useful information for your next attempt to quit.  Please bring all medications to your clinic visits.  Please arrive 10-15 minutes prior  to your scheduled visit time.

## 2024-03-19 NOTE — Patient Instructions (Addendum)
 VISIT SUMMARY: Today, we discussed your blood sugar control, medication management, and other health concerns. We made some adjustments to your insulin  dosage and discussed options for managing your blood pressure and kidney health. We also talked about your desire to quit smoking and provided support for this goal.  YOUR PLAN: TYPE 2 DIABETES MELLITUS WITH HYPERGLYCEMIA AND DIABETIC KIDNEY DISEASE (PROTEINURIA): Your blood sugar levels have been fluctuating, and you have protein in your urine, which indicates diabetic kidney disease. -Continue taking metformin , 4 tablets daily. -We ordered a urinalysis to check for protein in your urine. -We will discuss with Dr. Koval about further insulin  adjustments. -We are considering losartan  or olmesartan to help protect your kidneys, but we may try this in 3 months  NICOTINE  DEPENDENCE: You smoke 7-8 cigarettes daily and want to quit. -We prescribed nicotine  patches and will check if your insurance covers them. -We encourage you to reduce the number of cigarettes you smoke daily.  Please let me know if you have any other questions.  Dr. Tharon

## 2024-03-19 NOTE — Assessment & Plan Note (Signed)
 ASCVD risk - primary  prevention in patient with diabetes. Last LDL is 46 at goal of <70  mg/dL. high intensity statin indicated.  -Continued Rosuvastatin  40 mg.

## 2024-04-20 ENCOUNTER — Telehealth: Payer: Self-pay | Admitting: Pharmacist

## 2024-04-20 NOTE — Telephone Encounter (Signed)
 Reviewed and agree with Dr Rennis plan.

## 2024-04-20 NOTE — Telephone Encounter (Signed)
 Patient contacted for follow-up of LIBERATE study sensor recall.   Since last contact patient reports no adverse effect from the sensors.  However, when she heard of the recall she decided to stop using the sensors AND withdraw from the study.  She asked me to cancel her scheduled follow-up visit with me on 05/05/2024 She plans to make appointment with Dr. Tharon in February.  She declined assistance while I was on the phone to help her with scheduling an appointment.   I asked her to return the sensors to us  an any future visit.  She stated she planned to return them to her pharmacy.   She did admit that she learned something from using the sensor but stated I don't want to be part of any research study. I apologized for her worry and concern related to using the sensors.  I thanked her for her time.   Total time with patient call and documentation of interaction: .  Visit 05/05/2024 cancelled

## 2024-05-05 ENCOUNTER — Ambulatory Visit: Admitting: Pharmacist

## 2024-05-26 ENCOUNTER — Telehealth: Payer: Self-pay

## 2024-05-26 NOTE — Telephone Encounter (Signed)
 Received call from patient's insurance regarding insulin  coverage. In 2026, insurance will no longer cover Basaglar . They are requesting that we switch from Basaglar  to Lantus . If appropriate, please send new prescription for Lantus  to patient's pharmacy.   Chiquita JAYSON English, RN

## 2024-05-27 MED ORDER — INSULIN GLARGINE 100 UNIT/ML SOLOSTAR PEN: 12.0000 [IU] | PEN_INJECTOR | SUBCUTANEOUS | 2 refills | Status: AC

## 2024-06-03 NOTE — Progress Notes (Signed)
 Haley Mendez                                          MRN: [id number]   06/03/2024   The [facility] Quality Team Specialist reviewed this patient medical record for the purposes of chart review for care gap closure. The following were reviewed: chart review for care gap closure-glycemic status assessment.    [facility]I Quality Team

## 2024-06-23 ENCOUNTER — Ambulatory Visit: Payer: Self-pay | Admitting: Family Medicine
